# Patient Record
Sex: Female | Born: 1985 | Race: Black or African American | Hispanic: No | Marital: Single | Smoking: Current every day smoker
Health system: Southern US, Community
[De-identification: ages and names within clinical notes are randomized; demographics above are authoritative.]

## PROBLEM LIST (undated history)

## (undated) ENCOUNTER — Inpatient Hospital Stay (HOSPITAL_COMMUNITY): Payer: Self-pay

## (undated) DIAGNOSIS — F32A Depression, unspecified: Secondary | ICD-10-CM

## (undated) DIAGNOSIS — F419 Anxiety disorder, unspecified: Secondary | ICD-10-CM

## (undated) DIAGNOSIS — A599 Trichomoniasis, unspecified: Secondary | ICD-10-CM

## (undated) DIAGNOSIS — F329 Major depressive disorder, single episode, unspecified: Secondary | ICD-10-CM

## (undated) DIAGNOSIS — D573 Sickle-cell trait: Secondary | ICD-10-CM

## (undated) DIAGNOSIS — Z87442 Personal history of urinary calculi: Secondary | ICD-10-CM

## (undated) HISTORY — PX: OTHER SURGICAL HISTORY: SHX169

---
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
## 2006-02-03 ENCOUNTER — Inpatient Hospital Stay (HOSPITAL_COMMUNITY): Admission: AD | Admit: 2006-02-03 | Discharge: 2006-02-06 | Payer: Self-pay | Admitting: Gynecology

## 2006-02-03 ENCOUNTER — Ambulatory Visit: Payer: Self-pay | Admitting: Gynecology

## 2007-04-17 ENCOUNTER — Emergency Department (HOSPITAL_COMMUNITY): Admission: EM | Admit: 2007-04-17 | Discharge: 2007-04-17 | Payer: Self-pay | Admitting: Family Medicine

## 2007-04-25 ENCOUNTER — Emergency Department (HOSPITAL_COMMUNITY): Admission: EM | Admit: 2007-04-25 | Discharge: 2007-04-25 | Payer: Self-pay | Admitting: Emergency Medicine

## 2007-10-24 ENCOUNTER — Inpatient Hospital Stay (HOSPITAL_COMMUNITY): Admission: AD | Admit: 2007-10-24 | Discharge: 2007-10-24 | Payer: Self-pay | Admitting: Obstetrics

## 2007-10-27 ENCOUNTER — Inpatient Hospital Stay (HOSPITAL_COMMUNITY): Admission: AD | Admit: 2007-10-27 | Discharge: 2007-10-27 | Payer: Self-pay | Admitting: Obstetrics

## 2007-11-02 ENCOUNTER — Inpatient Hospital Stay (HOSPITAL_COMMUNITY): Admission: AD | Admit: 2007-11-02 | Discharge: 2007-11-02 | Payer: Self-pay | Admitting: Obstetrics

## 2007-11-04 ENCOUNTER — Inpatient Hospital Stay (HOSPITAL_COMMUNITY): Admission: AD | Admit: 2007-11-04 | Discharge: 2007-11-06 | Payer: Self-pay | Admitting: Obstetrics

## 2007-11-05 ENCOUNTER — Encounter: Payer: Self-pay | Admitting: Obstetrics

## 2009-01-13 ENCOUNTER — Emergency Department (HOSPITAL_COMMUNITY): Admission: EM | Admit: 2009-01-13 | Discharge: 2009-01-13 | Payer: Self-pay | Admitting: Emergency Medicine

## 2009-01-14 ENCOUNTER — Observation Stay (HOSPITAL_COMMUNITY): Admission: EM | Admit: 2009-01-14 | Discharge: 2009-01-14 | Payer: Self-pay | Admitting: Emergency Medicine

## 2009-03-08 ENCOUNTER — Emergency Department (HOSPITAL_COMMUNITY): Admission: EM | Admit: 2009-03-08 | Discharge: 2009-03-08 | Payer: Self-pay | Admitting: Emergency Medicine

## 2009-03-17 ENCOUNTER — Emergency Department (HOSPITAL_COMMUNITY): Admission: EM | Admit: 2009-03-17 | Discharge: 2009-03-17 | Payer: Self-pay | Admitting: Emergency Medicine

## 2009-08-21 ENCOUNTER — Emergency Department (HOSPITAL_COMMUNITY): Admission: EM | Admit: 2009-08-21 | Discharge: 2009-08-21 | Payer: Self-pay | Admitting: Emergency Medicine

## 2009-11-01 ENCOUNTER — Emergency Department (HOSPITAL_COMMUNITY): Admission: EM | Admit: 2009-11-01 | Discharge: 2009-11-02 | Payer: Self-pay | Admitting: Emergency Medicine

## 2010-05-01 LAB — POCT I-STAT, CHEM 8
Calcium, Ion: 1.15 mmol/L (ref 1.12–1.32)
Chloride: 109 mEq/L (ref 96–112)
Creatinine, Ser: 1.1 mg/dL (ref 0.4–1.2)
Glucose, Bld: 95 mg/dL (ref 70–99)
Potassium: 3.8 mEq/L (ref 3.5–5.1)

## 2010-05-01 LAB — URINALYSIS, ROUTINE W REFLEX MICROSCOPIC
Bilirubin Urine: NEGATIVE
Hgb urine dipstick: NEGATIVE
Ketones, ur: NEGATIVE mg/dL
Specific Gravity, Urine: 1.021 (ref 1.005–1.030)
pH: 6 (ref 5.0–8.0)

## 2010-09-08 ENCOUNTER — Emergency Department (HOSPITAL_COMMUNITY)
Admission: EM | Admit: 2010-09-08 | Discharge: 2010-09-08 | Disposition: A | Discharge status: Home / Self Care | Payer: Self-pay | Attending: Emergency Medicine | Admitting: Emergency Medicine

## 2010-09-08 ENCOUNTER — Emergency Department (HOSPITAL_COMMUNITY): Payer: Self-pay

## 2010-09-08 DIAGNOSIS — N760 Acute vaginitis: Secondary | ICD-10-CM | POA: Insufficient documentation

## 2010-09-08 DIAGNOSIS — M545 Low back pain, unspecified: Secondary | ICD-10-CM | POA: Insufficient documentation

## 2010-09-08 DIAGNOSIS — A499 Bacterial infection, unspecified: Secondary | ICD-10-CM | POA: Insufficient documentation

## 2010-09-08 DIAGNOSIS — E278 Other specified disorders of adrenal gland: Secondary | ICD-10-CM | POA: Insufficient documentation

## 2010-09-08 DIAGNOSIS — R109 Unspecified abdominal pain: Secondary | ICD-10-CM | POA: Insufficient documentation

## 2010-09-08 DIAGNOSIS — N3090 Cystitis, unspecified without hematuria: Secondary | ICD-10-CM | POA: Insufficient documentation

## 2010-09-08 DIAGNOSIS — D573 Sickle-cell trait: Secondary | ICD-10-CM | POA: Insufficient documentation

## 2010-09-08 DIAGNOSIS — B9689 Other specified bacterial agents as the cause of diseases classified elsewhere: Secondary | ICD-10-CM | POA: Insufficient documentation

## 2010-09-08 LAB — URINALYSIS, ROUTINE W REFLEX MICROSCOPIC
Glucose, UA: NEGATIVE mg/dL
Hgb urine dipstick: NEGATIVE
Protein, ur: NEGATIVE mg/dL
Specific Gravity, Urine: 1.016 (ref 1.005–1.030)
pH: 5.5 (ref 5.0–8.0)

## 2010-09-08 LAB — URINE MICROSCOPIC-ADD ON

## 2010-09-08 LAB — WET PREP, GENITAL: Yeast Wet Prep HPF POC: NONE SEEN

## 2010-09-09 LAB — GC/CHLAMYDIA PROBE AMP, GENITAL: GC Probe Amp, Genital: NEGATIVE

## 2010-11-04 LAB — URINALYSIS, ROUTINE W REFLEX MICROSCOPIC
Hgb urine dipstick: NEGATIVE
Specific Gravity, Urine: 1.018
Urobilinogen, UA: 1
pH: 7.5

## 2010-11-04 LAB — CBC
HCT: 34.6 — ABNORMAL LOW
Hemoglobin: 11.9 — ABNORMAL LOW
MCHC: 34.4
RDW: 13.6

## 2010-11-04 LAB — URINE MICROSCOPIC-ADD ON

## 2010-11-04 LAB — I-STAT 8, (EC8 V) (CONVERTED LAB)
BUN: <3 — ABNORMAL LOW
Bicarbonate: 19.5 — ABNORMAL LOW
Glucose, Bld: 87
pCO2, Ven: 26.9 — ABNORMAL LOW

## 2010-11-04 LAB — DIFFERENTIAL
Basophils Absolute: 0
Basophils Relative: 0
Eosinophils Relative: 3
Monocytes Absolute: 0.2
Neutro Abs: 4.8

## 2010-11-04 LAB — POCT PREGNANCY, URINE: Preg Test, Ur: POSITIVE

## 2010-11-11 LAB — CBC
HCT: 31.6 — ABNORMAL LOW
HCT: 32.8 — ABNORMAL LOW
MCV: 91.1
MCV: 91.3
Platelets: 227
RBC: 3.6 — ABNORMAL LOW
WBC: 10.2
WBC: 8.1

## 2010-11-11 LAB — CULTURE, BLOOD (ROUTINE X 2)

## 2010-11-11 LAB — CARDIOLIPIN ANTIBODIES, IGG, IGM, IGA
Anticardiolipin IgA: <7 — ABNORMAL LOW (ref <13)
Anticardiolipin IgM: <7 — ABNORMAL LOW (ref <10)

## 2010-11-11 LAB — COMPREHENSIVE METABOLIC PANEL
ALT: 11
AST: 21
Calcium: 8.8
GFR calc Af Amer: >60
Sodium: 138
Total Protein: 5.4 — ABNORMAL LOW

## 2010-11-11 LAB — LUPUS ANTICOAGULANT PANEL
DRVVT: 42.8 (ref 36.1–47.0)
PTT Lupus Anticoagulant: 39.4 (ref 36.3–48.8)

## 2010-11-11 LAB — URINE DRUGS OF ABUSE SCREEN W ALC, ROUTINE (REF LAB)
Barbiturate Quant, Ur: NEGATIVE
Creatinine,U: 54.9
Methadone: NEGATIVE
Phencyclidine (PCP): NEGATIVE

## 2010-11-11 LAB — T4, FREE: Free T4: 1.02

## 2011-08-27 ENCOUNTER — Other Ambulatory Visit (HOSPITAL_COMMUNITY): Payer: Self-pay | Admitting: Obstetrics

## 2011-08-27 DIAGNOSIS — R1011 Right upper quadrant pain: Secondary | ICD-10-CM

## 2011-08-29 ENCOUNTER — Ambulatory Visit (HOSPITAL_COMMUNITY): Payer: Medicaid Other

## 2011-09-01 ENCOUNTER — Encounter (HOSPITAL_COMMUNITY): Payer: Self-pay | Admitting: *Deleted

## 2011-09-01 ENCOUNTER — Inpatient Hospital Stay (HOSPITAL_COMMUNITY)
Admission: AD | Admit: 2011-09-01 | Discharge: 2011-09-01 | Disposition: A | Discharge status: Home / Self Care | Payer: Medicaid Other | Source: Ambulatory Visit | Attending: Obstetrics | Admitting: Obstetrics

## 2011-09-01 DIAGNOSIS — N898 Other specified noninflammatory disorders of vagina: Secondary | ICD-10-CM

## 2011-09-01 DIAGNOSIS — N938 Other specified abnormal uterine and vaginal bleeding: Secondary | ICD-10-CM | POA: Insufficient documentation

## 2011-09-01 DIAGNOSIS — N949 Unspecified condition associated with female genital organs and menstrual cycle: Secondary | ICD-10-CM | POA: Insufficient documentation

## 2011-09-01 HISTORY — DX: Sickle-cell trait: D57.3

## 2011-09-01 HISTORY — DX: Trichomoniasis, unspecified: A59.9

## 2011-09-01 HISTORY — DX: Major depressive disorder, single episode, unspecified: F32.9

## 2011-09-01 HISTORY — DX: Depression, unspecified: F32.A

## 2011-09-01 LAB — WET PREP, GENITAL
Clue Cells Wet Prep HPF POC: NONE SEEN
Trich, Wet Prep: NONE SEEN

## 2011-09-01 LAB — POCT PREGNANCY, URINE: Preg Test, Ur: NEGATIVE

## 2011-09-01 NOTE — MAU Note (Signed)
Had std screen, pap smear.  Was called with diagnosis of trichomonas- took 4 tablets for 1 time treatment on Thursday.  Friday started spotting, got a little heavier, but is brown in color- no redness noted.  Has implanon- 'expired' today.

## 2011-09-01 NOTE — MAU Provider Note (Signed)
History     CSN: [id number]  Arrival date and time: 09/01/11 0102   First Provider Initiated Contact with Patient 09/01/11 0932      No chief complaint on file.  HPI Pt is not pregnant and complains of vaginal discharge .  She was seen in Dr. Elsie Lucas office on Monday and was notified on Friday that she had trichomonas and was treated with Flagyl.  Then started having brown vaginal discharge, like apple butter.  She was also checked for GC./ Chlamdyia which was negative in Dr. Elsie Lucas office. She has minimal lower abdominal pain, not like regular period type cramp. Her pain comes and goes, like a Geneticist, molecular.She took Dayquil this morning with sore throat and bad cough.  Pt's has had Implanon which expires today.  She has continued to have unscheduled bleeding with her Implanon.  Her last episode of bleeding was June 10.  Pt denies pain with urination, constipation or diarrhea. Past Medical History  Diagnosis Date  . Sickle cell trait   . Trichomonas   . Depression     after loss of son    Past Surgical History  Procedure Date  . Extraction wisdom     Family History  Problem Relation Age of Onset  . Other Neg Hx     History  Substance Use Topics  . Smoking status: Current Everyday Smoker -- 0.5 packs/day for 10 years    Types: Cigarettes  . Smokeless tobacco: Never Used  . Alcohol Use: No    Allergies:  Allergies  Allergen Reactions  . Amoxicillin Anaphylaxis    Prescriptions prior to admission  Medication Sig Dispense Refill  . etonogestrel (IMPLANON) 68 MG IMPL implant Inject 1 each into the skin once.      . Pseudoeph-Doxylamine-DM-APAP (NYQUIL MULTI-SYMPTOM PO) Take 1 tablet by mouth at bedtime as needed. For cold symptoms      . Pseudoephedrine-APAP-DM (DAYQUIL MULTI-SYMPTOM PO) Take 1 tablet by mouth daily as needed. For cold symptoms      . tinidazole (TINDAMAX) 500 MG tablet Take 2 g by mouth once.        Review of Systems    Constitutional: Negative for fever and chills.  Gastrointestinal: Negative for nausea, vomiting, abdominal pain, diarrhea and constipation.  Genitourinary: Negative for dysuria, urgency, frequency and hematuria.   Physical Exam   Blood pressure 127/79, pulse 82, temperature 98.3 F (36.8 C), temperature source Oral, resp. rate 18, height 5\' 5"  (1.651 m), weight 167 lb (75.751 kg), last menstrual period 07/21/2011.  Physical Exam  Nursing note and vitals reviewed. Constitutional: She is oriented to person, place, and time. She appears well-developed and well-nourished.  HENT:  Head: Normocephalic.  Eyes: Pupils are equal, round, and reactive to light.  Neck: Normal range of motion. Neck supple.  Cardiovascular: Normal rate.   Respiratory: Effort normal.  GI: Soft. She exhibits no distension. There is no tenderness. There is no rebound and no guarding.  Genitourinary:       Small amount of brown discharge noted in vault; cervix clean; uterus and adnexal without palpable enlargement or tenderness; difficult to outline uterus and ovaries due to habitus.  Musculoskeletal: Normal range of motion.  Neurological: She is alert and oriented to person, place, and time.  Skin: Skin is warm and dry.  Psychiatric: She has a normal mood and affect.    MAU Course  Procedures Results for orders placed during the hospital encounter of 09/01/11 (from the past 72 hour(s))  POCT  PREGNANCY, URINE     Status: Normal   Collection Time   09/01/11  9:24 AM      Component Value Range Comment   Preg Test, Ur NEGATIVE  NEGATIVE   WET PREP, GENITAL     Status: Abnormal   Collection Time   09/01/11  9:42 AM      Component Value Range Comment   Yeast Wet Prep HPF POC NONE SEEN  NONE SEEN    Trich, Wet Prep NONE SEEN  NONE SEEN    Clue Cells Wet Prep HPF POC NONE SEEN  NONE SEEN    WBC, Wet Prep HPF POC FEW (*) NONE SEEN MANY BACTERIA SEEN    Assessment and Plan  Implanon break thru bleeding F/u with  Dr. Gaynell Lucas for replacement Implanon  Brooke Lucas 09/01/2011, 9:33 AM

## 2011-09-01 NOTE — MAU Note (Signed)
Scant old blood noted with exam

## 2011-09-05 ENCOUNTER — Ambulatory Visit (HOSPITAL_COMMUNITY): Payer: Self-pay

## 2011-11-10 ENCOUNTER — Emergency Department (HOSPITAL_COMMUNITY)
Admission: EM | Admit: 2011-11-10 | Discharge: 2011-11-10 | Disposition: A | Discharge status: Home / Self Care | Payer: Medicaid Other | Attending: Emergency Medicine | Admitting: Emergency Medicine

## 2011-11-10 ENCOUNTER — Encounter (HOSPITAL_COMMUNITY): Payer: Self-pay | Admitting: *Deleted

## 2011-11-10 DIAGNOSIS — D573 Sickle-cell trait: Secondary | ICD-10-CM | POA: Insufficient documentation

## 2011-11-10 DIAGNOSIS — L0231 Cutaneous abscess of buttock: Secondary | ICD-10-CM | POA: Insufficient documentation

## 2011-11-10 DIAGNOSIS — F17200 Nicotine dependence, unspecified, uncomplicated: Secondary | ICD-10-CM | POA: Insufficient documentation

## 2011-11-10 MED ORDER — HYDROCODONE-ACETAMINOPHEN 5-325 MG PO TABS: 1.0000 | ORAL_TABLET | ORAL | Status: DC | PRN

## 2011-11-10 NOTE — ED Notes (Signed)
Pt discharged.Vital signs stable and GCS 15 

## 2011-11-10 NOTE — ED Notes (Signed)
Pt presented to ED with abscess in the perianal area and very tender.

## 2011-11-10 NOTE — ED Provider Notes (Signed)
History     CSN: [id number]  Arrival date & time 11/10/11  2148   First MD Initiated Contact with Patient 11/10/11 2219      Chief Complaint  Patient presents with  . Abscess    (Consider location/radiation/quality/duration/timing/severity/associated sxs/prior treatment) HPI History provided by pt.   Pt presents w/ severely painful abscess of right medial buttock x 5 days.  Pain aggravated by walking and sitting.  Has attempted to squeeze but has not noticed any drainage.  No associated fever.  Has not had a BM since onset of sx.  No prior h/o abscess.  Also c/o painful cyst right groin that developed 3 days ago.  No associated GU sx.   Past Medical History  Diagnosis Date  . Sickle cell trait   . Trichomonas   . Depression     after loss of son    Past Surgical History  Procedure Date  . Extraction wisdom     Family History  Problem Relation Age of Onset  . Other Neg Hx     History  Substance Use Topics  . Smoking status: Current Every Day Smoker -- 0.5 packs/day for 10 years    Types: Cigarettes  . Smokeless tobacco: Never Used  . Alcohol Use: No    OB History    Grav Para Term Preterm Abortions TAB SAB Ect Mult Living   2 2 2  0 0 0 0 0 0 1      Review of Systems  All other systems reviewed and are negative.    Allergies  Amoxicillin and Penicillins  Home Medications   Current Outpatient Rx  Name Route Sig Dispense Refill  . ETONOGESTREL 68 MG [place] IMPL Subcutaneous Inject 1 each into the skin once.      BP 105/74  Pulse 90  Temp 98.8 F (37.1 C) (Oral)  Resp 18  SpO2 96%  Physical Exam  Nursing note and vitals reviewed. Constitutional: She is oriented to person, place, and time. She appears well-developed and well-nourished. No distress.  HENT:  Head: Normocephalic and atraumatic.  Eyes:       Normal appearance  Neck: Normal range of motion.  Pulmonary/Chest: Effort normal.  Genitourinary:       No tenderness of fistula of anus.     Pea-sized tender lymph node right groin.  Symmetrical to left side.   Musculoskeletal: Normal range of motion.  Neurological: She is alert and oriented to person, place, and time.  Skin:       3cm fluctuant abscess right medial buttock.  Severely tender.  No surrounding cellulitis.   Psychiatric: She has a normal mood and affect. Her behavior is normal.    ED Course  Procedures (including critical care time)  INCISION AND DRAINAGE Performed by: Otilio Miu Consent: Verbal consent obtained. Risks and benefits: risks, benefits and alternatives were discussed Type: abscess  Body area: right buttock  Anesthesia: local infiltration  Local anesthetic: lidocaine 2% w/ epinephrine  Anesthetic total: 5 ml  Complexity: complex Blunt dissection to break up loculations  Drainage: purulent  Drainage amount: moderate  Packing material: 1/4 in iodoform gauze  Patient tolerance: Patient tolerated the procedure well with no immediate complications.    Labs Reviewed - No data to display No results found.   1. Abscess of buttock       MDM  Pt presents w/ abscess of right medial buttock.  No surrounding cellulitis.  No apparent rectal involvement.  I&D'd and pt d/c'd home w/  vicodin for pain.  She will f/u with her PCP in 2 days for recheck and packing removal.  Also c/o painful lesion right groin.  Based on exam, I suspect an inflamed lymph node related to abscess.  Pt instructed to f/u w/ her PCP if it worsens or has not resolved by the time the abscess is healed.  Return precautions discussed.         Otilio Miu, [place] 11/10/11 [phone]

## 2011-11-10 NOTE — ED Notes (Signed)
Thurs pt noticed an irritation in her pelvic area.  Since then she has noticed a "hard lump" getting bigger and today she noticed a swollen lymph node to her R inguinal area.

## 2011-12-04 NOTE — ED Provider Notes (Signed)
Medical screening examination/treatment/procedure(s) were performed by non-physician practitioner and as supervising physician I was immediately available for consultation/collaboration.   Carleene Cooper III, MD 12/04/11 [phone]

## 2011-12-09 ENCOUNTER — Encounter (HOSPITAL_COMMUNITY): Payer: Self-pay

## 2011-12-09 ENCOUNTER — Emergency Department (HOSPITAL_COMMUNITY)
Admission: EM | Admit: 2011-12-09 | Discharge: 2011-12-10 | Discharge status: Against Medical Advice | Payer: Medicaid Other | Attending: Emergency Medicine | Admitting: Emergency Medicine

## 2011-12-09 ENCOUNTER — Emergency Department (HOSPITAL_COMMUNITY): Payer: Medicaid Other

## 2011-12-09 DIAGNOSIS — R1013 Epigastric pain: Secondary | ICD-10-CM | POA: Insufficient documentation

## 2011-12-09 DIAGNOSIS — R109 Unspecified abdominal pain: Secondary | ICD-10-CM

## 2011-12-09 DIAGNOSIS — F17200 Nicotine dependence, unspecified, uncomplicated: Secondary | ICD-10-CM | POA: Insufficient documentation

## 2011-12-09 DIAGNOSIS — D573 Sickle-cell trait: Secondary | ICD-10-CM | POA: Insufficient documentation

## 2011-12-09 DIAGNOSIS — R112 Nausea with vomiting, unspecified: Secondary | ICD-10-CM | POA: Insufficient documentation

## 2011-12-09 DIAGNOSIS — Z8659 Personal history of other mental and behavioral disorders: Secondary | ICD-10-CM | POA: Insufficient documentation

## 2011-12-09 DIAGNOSIS — Z8619 Personal history of other infectious and parasitic diseases: Secondary | ICD-10-CM | POA: Insufficient documentation

## 2011-12-09 LAB — CBC WITH DIFFERENTIAL/PLATELET
Basophils Absolute: 0 10*3/uL (ref 0.0–0.1)
Basophils Relative: 0 % (ref 0–1)
Hemoglobin: 13.8 g/dL (ref 12.0–15.0)
MCHC: 35.2 g/dL (ref 30.0–36.0)
Neutro Abs: 2.7 10*3/uL (ref 1.7–7.7)
Neutrophils Relative %: 44 % (ref 43–77)
Platelets: 227 10*3/uL (ref 150–400)
RDW: 12.5 % (ref 11.5–15.5)

## 2011-12-09 LAB — COMPREHENSIVE METABOLIC PANEL
AST: 13 U/L (ref 0–37)
Albumin: 4.5 g/dL (ref 3.5–5.2)
Alkaline Phosphatase: 56 U/L (ref 39–117)
Chloride: 102 mEq/L (ref 96–112)
Potassium: 3.4 mEq/L — ABNORMAL LOW (ref 3.5–5.1)
Sodium: 138 mEq/L (ref 135–145)
Total Bilirubin: 0.4 mg/dL (ref 0.3–1.2)
Total Protein: 7.5 g/dL (ref 6.0–8.3)

## 2011-12-09 LAB — LIPASE, BLOOD: Lipase: 34 U/L (ref 11–59)

## 2011-12-09 LAB — URINE MICROSCOPIC-ADD ON

## 2011-12-09 LAB — URINALYSIS, ROUTINE W REFLEX MICROSCOPIC
Glucose, UA: NEGATIVE mg/dL
Hgb urine dipstick: NEGATIVE
Ketones, ur: NEGATIVE mg/dL
Protein, ur: NEGATIVE mg/dL
pH: 6.5 (ref 5.0–8.0)

## 2011-12-09 LAB — POCT PREGNANCY, URINE: Preg Test, Ur: NEGATIVE

## 2011-12-09 NOTE — ED Notes (Addendum)
Pt reports lower abd cramping, "feeling sick," unable to eat or drink x2-3 days. Pt reports her BC implant expired 09/01/2011, pt reports her OBGYN informed her they would not change the implant until she has a menstrual cycle. Pt reports last menstrual cycle was 07/2011. Pt reports OTC pregnancy test have been negative  Pt denies abnormal vaginal bleeding or odor, reports increase in vaginal d/c

## 2011-12-09 NOTE — ED Provider Notes (Signed)
History     CSN: [id number]  Arrival date & time 12/09/11  2033   First MD Initiated Contact with Patient 12/09/11 2336      Chief Complaint  Patient presents with  . Abdominal Pain    (Consider location/radiation/quality/duration/timing/severity/associated sxs/prior treatment) HPIJovona W Lucas is a 26 y.o. female presenting with 3 days history of epigastric abdominal pain. She is a G2 P1 with a stillborn child. She says the pain is 6-10 out of 10 it is contraction type coming and going pain, she says she has no history of constipation, this is worse after she eats. Just complains of some vaginal discharge this morning usual however she has not had any vaginitis or pain. She is sexually active with one female partner using no condoms. She does have a history of using the Implanon however did not have it replaced when it was scheduled to in July. She denies any dysuria, frequency, denies any diarrhea. She says she's had nausea and vomiting associated with eating for the last 3 days as well. She does smoke, she has sickle cell trait and she denies any alcohol or any illicit drug use.   Past Medical History  Diagnosis Date  . Sickle cell trait   . Trichomonas   . Depression     after loss of son    Past Surgical History  Procedure Date  . Extraction wisdom     Family History  Problem Relation Age of Onset  . Other Neg Hx     History  Substance Use Topics  . Smoking status: Current Every Day Smoker -- 0.5 packs/day for 10 years    Types: Cigarettes  . Smokeless tobacco: Never Used  . Alcohol Use: No    OB History    Grav Para Term Preterm Abortions TAB SAB Ect Mult Living   2 2 2  0 0 0 0 0 0 1      Review of Systems At least 10pt or greater review of systems completed and are negative except where specified in the HPI.  Allergies  Amoxicillin and Penicillins  Home Medications   Current Outpatient Rx  Name Route Sig Dispense Refill  . ETONOGESTREL 68 MG [place] IMPL  Subcutaneous Inject 1 each into the skin once.      BP 137/64  Pulse 67  Temp 98.3 F (36.8 C) (Oral)  Resp 16  SpO2 99%  Physical Exam  Nursing notes reviewed.  Electronic medical record reviewed. VITAL SIGNS:   Filed Vitals:   12/09/11 2101  BP: 137/64  Pulse: 67  Temp: 98.3 F (36.8 C)  TempSrc: Oral  Resp: 16  SpO2: 99%   CONSTITUTIONAL: Awake, oriented, appears non-toxic HENT: Atraumatic, normocephalic, oral mucosa pink and moist, airway patent. Nares patent without drainage. External ears normal. EYES: Conjunctiva clear, EOMI, PERRLA NECK: Trachea midline, non-tender, supple CARDIOVASCULAR: Normal heart rate, Normal rhythm, No murmurs, rubs, gallops PULMONARY/CHEST: Clear to auscultation, no rhonchi, wheezes, or rales. Symmetrical breath sounds. Non-tender. ABDOMINAL: Non-distended, soft, nontender to palpation in the epigastrium, no rebound or guarding.  BS normal. NEUROLOGIC: Non-focal, moving all four extremities, no gross sensory or motor deficits. EXTREMITIES: No clubbing, cyanosis, or edema SKIN: Warm, Dry, No erythema, No rash  ED Course  Procedures (including critical care time)  Labs Reviewed  CBC WITH DIFFERENTIAL - Abnormal; Notable for the following:    Lymphocytes Relative 48 (*)     All other components within normal limits  COMPREHENSIVE METABOLIC PANEL - Abnormal; Notable for the  following:    Potassium 3.4 (*)     GFR calc non Af Amer 87 (*)     All other components within normal limits  URINALYSIS, ROUTINE W REFLEX MICROSCOPIC - Abnormal; Notable for the following:    APPearance CLOUDY (*)     Leukocytes, UA SMALL (*)     All other components within normal limits  URINE MICROSCOPIC-ADD ON - Abnormal; Notable for the following:    Squamous Epithelial / LPF MANY (*)     Bacteria, UA MANY (*)     All other components within normal limits  POCT PREGNANCY, URINE  URINE CULTURE   No results found.   No diagnosis found.    MDM  Brooke Lucas is a 26 y.o. female presents with 3 days history of nausea vomiting and epigastric pain- her clinical presentation is consistent with constipation.  Labs were triage showing no abnormalities on a comprehensive metabolic panel-urine microscopic was unremarkable and her pregnancy test was negative.  Ordered a pelvic exam, and an x-ray of her abdomen - however the patient left AGAINST MEDICAL ADVICE prior to these tests being performed.  I was not able to see the patient or to intervene she simply left the room.          Jones Skene, MD 12/10/11 0005

## 2011-12-10 ENCOUNTER — Emergency Department (HOSPITAL_COMMUNITY): Payer: Medicaid Other

## 2011-12-11 LAB — URINE CULTURE

## 2011-12-24 ENCOUNTER — Emergency Department (HOSPITAL_COMMUNITY)
Admission: EM | Admit: 2011-12-24 | Discharge: 2011-12-24 | Disposition: A | Discharge status: Home / Self Care | Payer: Medicaid Other | Attending: Emergency Medicine | Admitting: Emergency Medicine

## 2011-12-24 ENCOUNTER — Encounter (HOSPITAL_COMMUNITY): Payer: Self-pay | Admitting: *Deleted

## 2011-12-24 ENCOUNTER — Emergency Department (HOSPITAL_COMMUNITY): Payer: Medicaid Other

## 2011-12-24 DIAGNOSIS — F3289 Other specified depressive episodes: Secondary | ICD-10-CM | POA: Insufficient documentation

## 2011-12-24 DIAGNOSIS — Z8619 Personal history of other infectious and parasitic diseases: Secondary | ICD-10-CM | POA: Insufficient documentation

## 2011-12-24 DIAGNOSIS — B349 Viral infection, unspecified: Secondary | ICD-10-CM

## 2011-12-24 DIAGNOSIS — F329 Major depressive disorder, single episode, unspecified: Secondary | ICD-10-CM | POA: Insufficient documentation

## 2011-12-24 DIAGNOSIS — D573 Sickle-cell trait: Secondary | ICD-10-CM | POA: Insufficient documentation

## 2011-12-24 DIAGNOSIS — Z79899 Other long term (current) drug therapy: Secondary | ICD-10-CM | POA: Insufficient documentation

## 2011-12-24 DIAGNOSIS — B9789 Other viral agents as the cause of diseases classified elsewhere: Secondary | ICD-10-CM | POA: Insufficient documentation

## 2011-12-24 DIAGNOSIS — F17200 Nicotine dependence, unspecified, uncomplicated: Secondary | ICD-10-CM | POA: Insufficient documentation

## 2011-12-24 LAB — CBC WITH DIFFERENTIAL/PLATELET
Eosinophils Relative: 6 % — ABNORMAL HIGH (ref 0–5)
HCT: 39.7 % (ref 36.0–46.0)
Hemoglobin: 13.8 g/dL (ref 12.0–15.0)
Lymphocytes Relative: 47 % — ABNORMAL HIGH (ref 12–46)
MCV: 86.1 fL (ref 78.0–100.0)
Monocytes Absolute: 0.2 10*3/uL (ref 0.1–1.0)
Monocytes Relative: 5 % (ref 3–12)
Neutro Abs: 1.9 10*3/uL (ref 1.7–7.7)
RDW: 12.8 % (ref 11.5–15.5)
WBC: 4.5 10*3/uL (ref 4.0–10.5)

## 2011-12-24 LAB — BASIC METABOLIC PANEL
BUN: 10 mg/dL (ref 6–23)
CO2: 25 mEq/L (ref 19–32)
Calcium: 9.1 mg/dL (ref 8.4–10.5)
Chloride: 104 mEq/L (ref 96–112)
Creatinine, Ser: 0.85 mg/dL (ref 0.50–1.10)
Glucose, Bld: 108 mg/dL — ABNORMAL HIGH (ref 70–99)

## 2011-12-24 LAB — URINALYSIS, ROUTINE W REFLEX MICROSCOPIC
Bilirubin Urine: NEGATIVE
Glucose, UA: NEGATIVE mg/dL
Hgb urine dipstick: NEGATIVE
Ketones, ur: NEGATIVE mg/dL
Leukocytes, UA: NEGATIVE
Protein, ur: NEGATIVE mg/dL
pH: 6.5 (ref 5.0–8.0)

## 2011-12-24 MED ORDER — SODIUM CHLORIDE 0.9 % IV BOLUS (SEPSIS)
1000.0000 mL | Freq: Once | INTRAVENOUS | Status: AC
  Administered 2011-12-24: 1000 mL via INTRAVENOUS

## 2011-12-24 MED ORDER — MORPHINE SULFATE 4 MG/ML IJ SOLN
4.0000 mg | Freq: Once | INTRAMUSCULAR | Status: AC
  Administered 2011-12-24: 4 mg via INTRAVENOUS
  Filled 2011-12-24: qty 1

## 2011-12-24 MED ORDER — IBUPROFEN 800 MG PO TABS: 800.0000 mg | ORAL_TABLET | Freq: Three times a day (TID) | ORAL | Status: DC | PRN

## 2011-12-24 MED ORDER — KETOROLAC TROMETHAMINE 30 MG/ML IJ SOLN
30.0000 mg | Freq: Once | INTRAMUSCULAR | Status: AC
  Administered 2011-12-24: 30 mg via INTRAVENOUS
  Filled 2011-12-24: qty 1

## 2011-12-24 NOTE — ED Notes (Signed)
Pt bought in by [facility] for flu like symptoms. Pt received 4mg  of Zofran IM PTA

## 2011-12-24 NOTE — ED Provider Notes (Signed)
History     CSN: [id number]  Arrival date & time 12/24/11  0436   First MD Initiated Contact with Patient 12/24/11 0445      Chief Complaint  Patient presents with  . Influenza    (Consider location/radiation/quality/duration/timing/severity/associated sxs/prior treatment) HPI PT with history of sickle trait reports she has been feeling bad since yesterday. Complaining of intermittent severe aching headache, chills, diffuse myalgias, productive cough and vomiting. She reports subjective fever at home. Symptoms improved with Norco she took at home.   Past Medical History  Diagnosis Date  . Sickle cell trait   . Trichomonas   . Depression     after loss of son    Past Surgical History  Procedure Date  . Extraction wisdom     Family History  Problem Relation Age of Onset  . Other Neg Hx     History  Substance Use Topics  . Smoking status: Current Every Day Smoker -- 0.5 packs/day for 10 years    Types: Cigarettes  . Smokeless tobacco: Never Used  . Alcohol Use: No    OB History    Grav Para Term Preterm Abortions TAB SAB Ect Mult Living   2 2 2  0 0 0 0 0 0 1      Review of Systems All other systems reviewed and are negative except as noted in HPI.   Allergies  Amoxicillin and Penicillins  Home Medications   Current Outpatient Rx  Name  Route  Sig  Dispense  Refill  . ETONOGESTREL 68 MG [place] IMPL   Subcutaneous   Inject 1 each into the skin once.           Pulse 68  Temp 97.3 F (36.3 C) (Oral)  Resp 20  SpO2 100%  Physical Exam  Nursing note and vitals reviewed. Constitutional: She is oriented to person, place, and time. She appears well-developed and well-nourished.  HENT:  Head: Normocephalic and atraumatic.       Membranes dry  Eyes: EOM are normal. Pupils are equal, round, and reactive to light.  Neck: Normal range of motion. Neck supple.       No meningismus  Cardiovascular: Normal rate, normal heart sounds and intact distal pulses.    Pulmonary/Chest: Effort normal and breath sounds normal.  Abdominal: Bowel sounds are normal. She exhibits no distension. There is no tenderness.  Musculoskeletal: Normal range of motion. She exhibits no edema and no tenderness.  Lymphadenopathy:    She has no cervical adenopathy.  Neurological: She is alert and oriented to person, place, and time. She has normal strength. No cranial nerve deficit or sensory deficit.  Skin: Skin is warm and dry. No rash noted.  Psychiatric: She has a normal mood and affect.    ED Course  Procedures (including critical care time)  Labs Reviewed  CBC WITH DIFFERENTIAL - Abnormal; Notable for the following:    Lymphocytes Relative 47 (*)     Eosinophils Relative 6 (*)     All other components within normal limits  BASIC METABOLIC PANEL - Abnormal; Notable for the following:    Glucose, Bld 108 (*)     All other components within normal limits  URINALYSIS, ROUTINE W REFLEX MICROSCOPIC  PREGNANCY, URINE   No results found.   No diagnosis found.    MDM  Blood work neg. Pt feeling better. Awaiting UA. Care signed out to Dr. Roselyn Bering and Ebbie Ridge, PA to recheck.  Pius Byrom B. Bernette Mayers, MD 12/24/11 [phone]

## 2011-12-24 NOTE — ED Notes (Signed)
Pt told to call out when she feels like she can provide a urine.

## 2011-12-24 NOTE — ED Notes (Signed)
Pt states headache diminished, though continues to be 7/10.  Encouraged to give urine.  Is drinking sprite.

## 2012-03-27 ENCOUNTER — Emergency Department (HOSPITAL_COMMUNITY)
Admission: EM | Admit: 2012-03-27 | Discharge: 2012-03-27 | Disposition: A | Discharge status: Home / Self Care | Payer: Medicaid Other | Attending: Emergency Medicine | Admitting: Emergency Medicine

## 2012-03-27 ENCOUNTER — Encounter (HOSPITAL_COMMUNITY): Payer: Self-pay

## 2012-03-27 DIAGNOSIS — D573 Sickle-cell trait: Secondary | ICD-10-CM | POA: Insufficient documentation

## 2012-03-27 DIAGNOSIS — Z79899 Other long term (current) drug therapy: Secondary | ICD-10-CM | POA: Insufficient documentation

## 2012-03-27 DIAGNOSIS — Z8619 Personal history of other infectious and parasitic diseases: Secondary | ICD-10-CM | POA: Insufficient documentation

## 2012-03-27 DIAGNOSIS — Z8659 Personal history of other mental and behavioral disorders: Secondary | ICD-10-CM | POA: Insufficient documentation

## 2012-03-27 DIAGNOSIS — K612 Anorectal abscess: Secondary | ICD-10-CM | POA: Insufficient documentation

## 2012-03-27 DIAGNOSIS — K610 Anal abscess: Secondary | ICD-10-CM

## 2012-03-27 DIAGNOSIS — F17200 Nicotine dependence, unspecified, uncomplicated: Secondary | ICD-10-CM | POA: Insufficient documentation

## 2012-03-27 MED ORDER — HYDROCODONE-ACETAMINOPHEN 5-325 MG PO TABS: 1.0000 | ORAL_TABLET | Freq: Four times a day (QID) | ORAL | Status: DC | PRN

## 2012-03-27 NOTE — ED Provider Notes (Signed)
History     CSN: [id number]  Arrival date & time 03/27/12  0340   First MD Initiated Contact with Patient 03/27/12 0449      Chief Complaint  Patient presents with  . Abscess    (Consider location/radiation/quality/duration/timing/severity/associated sxs/prior treatment) Patient is a 27 y.o. female presenting with abscess. The history is provided by the patient.  Abscess Associated symptoms: no fever    patient with history of a perianal abscess in the past. This time on the opposite cheek on the left side patient has had dust discomfort and swelling in that area for about 3 weeks got significantly bigger and worse in the last 3 days. Upon arrival to the emergency department started draining spontaneously. Large amounts of purulent material patient feels much much better. Pain prior to that was 10 out of 10 now it's about a 1/10. Pain did radiate from the anal area to her left hip. No fever no nausea no vomiting no diarrhea. Patient is not immunocompromised.  Past Medical History  Diagnosis Date  . Sickle cell trait   . Trichomonas   . Depression     after loss of son    Past Surgical History  Procedure Laterality Date  . Extraction wisdom      Family History  Problem Relation Age of Onset  . Other Neg Hx     History  Substance Use Topics  . Smoking status: Current Every Day Smoker -- 0.50 packs/day for 10 years    Types: Cigarettes  . Smokeless tobacco: Never Used  . Alcohol Use: No    OB History   Grav Para Term Preterm Abortions TAB SAB Ect Mult Living   2 2 2  0 0 0 0 0 0 1      Review of Systems  Constitutional: Negative for fever.  HENT: Negative for congestion.   Eyes: Negative for visual disturbance.  Respiratory: Negative for shortness of breath.   Cardiovascular: Negative for chest pain.  Gastrointestinal: Negative for abdominal pain.  Genitourinary: Negative for dysuria.  Musculoskeletal: Negative for back pain.  Skin: Negative for rash.   Hematological: Does not bruise/bleed easily.    Allergies  Amoxicillin and Penicillins  Home Medications   Current Outpatient Rx  Name  Route  Sig  Dispense  Refill  . etonogestrel (IMPLANON) 68 MG IMPL implant   Subcutaneous   Inject 1 each into the skin once.         Marland Kitchen ibuprofen (ADVIL,MOTRIN) 800 MG tablet   Oral   Take 1 tablet (800 mg total) by mouth every 8 (eight) hours as needed for pain.   21 tablet   0   . HYDROcodone-acetaminophen (NORCO/VICODIN) 5-325 MG per tablet   Oral   Take 1-2 tablets by mouth every 6 (six) hours as needed for pain.   10 tablet   0     BP 97/66  Pulse 89  Temp(Src) 97.4 F (36.3 C) (Oral)  Resp 15  SpO2 98%  Physical Exam  Constitutional: She is oriented to person, place, and time. She appears well-developed and well-nourished.  HENT:  Head: Normocephalic and atraumatic.  Eyes: EOM are normal. Pupils are equal, round, and reactive to light.  Cardiovascular: Normal rate, regular rhythm and normal heart sounds.   No murmur heard. Pulmonary/Chest: Effort normal and breath sounds normal.  Abdominal: Soft. Bowel sounds are normal. There is no tenderness.  Genitourinary:  Perianal area left buttocks with a quarter size area of induration punctate in the center  draining purulent discharge. Sniffing only decompressed.  Musculoskeletal: Normal range of motion. She exhibits no edema.  Neurological: She is alert and oriented to person, place, and time. No cranial nerve deficit. She exhibits normal muscle tone. Coordination normal.  Skin: Skin is warm.    ED Course  Procedures (including critical care time)  Labs Reviewed - No data to display No results found.   1. Perianal abscess       MDM  Perianal abscess no evidence of perirectal involvement. On the left buttocks area spontaneously draining copious amounts of purulent discharge. Be required. Area of induration is about the size of a quarter. Patient will be discharged  home with pain medication and sitz baths.   Patient will return for any worsening of the abscess at this point in time she prefers not to have it I&D with a draining is much as it is has a good chance we'll do fine without an I&D.        Shelda Jakes, MD 03/27/12 [phone]

## 2012-03-27 NOTE — ED Notes (Addendum)
L perianal abscess ~ 1.5" from anus, ~ 0.5cm diameter,  White head noted, no drainage. Denies s/sx other than pain and swelling. Denies fever or drainage. Pt in gown. Suture cart & I&D tray set up at [facility]. Alert, NAD, calm, interactive.

## 2012-03-27 NOTE — ED Notes (Signed)
Pt reports "boil now draining pus".

## 2012-03-27 NOTE — ED Notes (Signed)
Patient presents with perianal/perineal abscess. Onset of swelling and pain 3 days ago but "bump has been there for almost 2 weeks." hx abscess & I&D in 10/2011. No fevers, sweats or chills. Has tried warm compress & pressure with no relief.

## 2012-08-12 ENCOUNTER — Ambulatory Visit: Payer: Self-pay | Admitting: Obstetrics

## 2012-08-25 ENCOUNTER — Ambulatory Visit: Payer: Self-pay | Admitting: Obstetrics

## 2012-09-23 ENCOUNTER — Ambulatory Visit: Payer: Self-pay | Admitting: Obstetrics

## 2012-10-30 ENCOUNTER — Encounter (HOSPITAL_COMMUNITY): Payer: Self-pay

## 2012-10-30 ENCOUNTER — Emergency Department (HOSPITAL_COMMUNITY)
Admission: EM | Admit: 2012-10-30 | Discharge: 2012-10-30 | Disposition: A | Discharge status: Home / Self Care | Payer: Medicaid Other | Attending: Emergency Medicine | Admitting: Emergency Medicine

## 2012-10-30 DIAGNOSIS — R51 Headache: Secondary | ICD-10-CM | POA: Insufficient documentation

## 2012-10-30 DIAGNOSIS — Z862 Personal history of diseases of the blood and blood-forming organs and certain disorders involving the immune mechanism: Secondary | ICD-10-CM | POA: Insufficient documentation

## 2012-10-30 DIAGNOSIS — Z8659 Personal history of other mental and behavioral disorders: Secondary | ICD-10-CM | POA: Insufficient documentation

## 2012-10-30 DIAGNOSIS — Z8619 Personal history of other infectious and parasitic diseases: Secondary | ICD-10-CM | POA: Insufficient documentation

## 2012-10-30 DIAGNOSIS — F17200 Nicotine dependence, unspecified, uncomplicated: Secondary | ICD-10-CM | POA: Insufficient documentation

## 2012-10-30 DIAGNOSIS — Z880 Allergy status to penicillin: Secondary | ICD-10-CM | POA: Insufficient documentation

## 2012-10-30 DIAGNOSIS — R110 Nausea: Secondary | ICD-10-CM | POA: Insufficient documentation

## 2012-10-30 MED ORDER — KETOROLAC TROMETHAMINE 30 MG/ML IJ SOLN
30.0000 mg | Freq: Once | INTRAMUSCULAR | Status: AC
  Administered 2012-10-30: 30 mg via INTRAVENOUS
  Filled 2012-10-30: qty 1

## 2012-10-30 MED ORDER — DIPHENHYDRAMINE HCL 50 MG/ML IJ SOLN
25.0000 mg | Freq: Once | INTRAMUSCULAR | Status: AC
  Administered 2012-10-30: 25 mg via INTRAVENOUS
  Filled 2012-10-30 (×2): qty 1

## 2012-10-30 MED ORDER — METOCLOPRAMIDE HCL 5 MG/ML IJ SOLN
10.0000 mg | Freq: Once | INTRAMUSCULAR | Status: AC
  Administered 2012-10-30: 10 mg via INTRAVENOUS
  Filled 2012-10-30: qty 2

## 2012-10-30 MED ORDER — SODIUM CHLORIDE 0.9 % IV BOLUS (SEPSIS)
1000.0000 mL | Freq: Once | INTRAVENOUS | Status: AC
  Administered 2012-10-30: 1000 mL via INTRAVENOUS

## 2012-10-30 NOTE — ED Notes (Signed)
Pt c/o headache to the left side of her head for the past 3 days/has not taken anything OTC for the headache-pt has hx sickle cell-states she has a crisis once a year and states it presents as a headache-nausea and dry heaved x 1  Per EMS/#20 left [facility]-Zofran 4 mg IV-MP SR/12 lead unremarkable

## 2012-10-30 NOTE — ED Provider Notes (Signed)
CSN: [id number]     Arrival date & time 10/30/12  4098 History   First MD Initiated Contact with Patient 10/30/12 0340     Chief Complaint  Patient presents with  . Headache   (Consider location/radiation/quality/duration/timing/severity/associated sxs/prior Treatment) HPI Comments: Patient is a 27 year old female with a past medical history of migraines who presents with a headache that woke her from sleep tonight. Patient reports a gradual onset and progressive worsening of the headache. The pain is sharp, constant and is located in the left head without radiation. Patient has tried nothing for symptoms without relief. No alleviating/aggravating factors. Patient reports associated nausea. Patient denies fever, vomiting, diarrhea, numbness/tingling, weakness, visual changes, congestion, chest pain, SOB, abdominal pain.     Patient is a 27 y.o. female presenting with headaches.  Headache Associated symptoms: nausea     Past Medical History  Diagnosis Date  . Sickle cell trait   . Trichomonas   . Depression     after loss of son   Past Surgical History  Procedure Laterality Date  . Extraction wisdom     Family History  Problem Relation Age of Onset  . Other Neg Hx    History  Substance Use Topics  . Smoking status: Current Every Day Smoker -- 0.50 packs/day for 10 years    Types: Cigarettes  . Smokeless tobacco: Never Used  . Alcohol Use: No   OB History   Grav Para Term Preterm Abortions TAB SAB Ect Mult Living   2 2 2  0 0 0 0 0 0 1     Review of Systems  Gastrointestinal: Positive for nausea.  Neurological: Positive for headaches.  All other systems reviewed and are negative.    Allergies  Amoxicillin and Penicillins  Home Medications   Current Outpatient Rx  Name  Route  Sig  Dispense  Refill  . etonogestrel (IMPLANON) 68 MG IMPL implant   Subcutaneous   Inject 1 each into the skin once.          There were no vitals taken for this visit. Physical  Exam  Nursing note and vitals reviewed. Constitutional: She is oriented to person, place, and time. She appears well-developed and well-nourished. No distress.  HENT:  Head: Normocephalic and atraumatic.  Eyes: Conjunctivae and EOM are normal.  Neck: Normal range of motion.  No meningeal signs.   Cardiovascular: Normal rate and regular rhythm.  Exam reveals no gallop and no friction rub.   No murmur heard. Pulmonary/Chest: Effort normal and breath sounds normal. She has no wheezes. She has no rales. She exhibits no tenderness.  Abdominal: Soft. She exhibits no distension. There is no tenderness. There is no rebound and no guarding.  Musculoskeletal: Normal range of motion.  Neurological: She is alert and oriented to person, place, and time. Coordination normal.  Speech is goal-oriented. Moves limbs without ataxia.   Skin: Skin is warm and dry.  Psychiatric: She has a normal mood and affect. Her behavior is normal.    ED Course  Procedures (including critical care time) Labs Review Labs Reviewed - No data to display Imaging Review No results found.  MDM   1. Headache     4:07 AM Patient given migraine cocktail and is feeling better. Patient is ready to go home. Vitals stable and patient afebrile. No meningeal signs. Patient instructed to return with worsening or concerning symptoms.     Emilia Beck, PA-C 10/30/12 0410

## 2012-10-30 NOTE — ED Provider Notes (Signed)
Medical screening examination/treatment/procedure(s) were performed by non-physician practitioner and as supervising physician I was immediately available for consultation/collaboration.  Elysha Daw M Kaja Jackowski, MD 10/30/12 0528 

## 2012-10-30 NOTE — ED Notes (Signed)
Bed: NW29 Expected date:  Expected time:  Means of arrival:  Comments: EMS/headache-sickle cell

## 2013-03-10 ENCOUNTER — Emergency Department (INDEPENDENT_AMBULATORY_CARE_PROVIDER_SITE_OTHER)
Admission: EM | Admit: 2013-03-10 | Discharge: 2013-03-10 | Disposition: A | Discharge status: Home / Self Care | Payer: Medicaid Other | Source: Home / Self Care | Attending: Emergency Medicine | Admitting: Emergency Medicine

## 2013-03-10 ENCOUNTER — Encounter (HOSPITAL_COMMUNITY): Payer: Self-pay | Admitting: Emergency Medicine

## 2013-03-10 DIAGNOSIS — N390 Urinary tract infection, site not specified: Secondary | ICD-10-CM

## 2013-03-10 LAB — POCT URINALYSIS DIP (DEVICE)
BILIRUBIN URINE: NEGATIVE
GLUCOSE, UA: NEGATIVE mg/dL
KETONES UR: NEGATIVE mg/dL
Nitrite: NEGATIVE
PH: 7.5 (ref 5.0–8.0)
Protein, ur: NEGATIVE mg/dL
Specific Gravity, Urine: 1.015 (ref 1.005–1.030)
Urobilinogen, UA: 1 mg/dL (ref 0.0–1.0)

## 2013-03-10 LAB — POCT PREGNANCY, URINE: Preg Test, Ur: NEGATIVE

## 2013-03-10 MED ORDER — HYDROCODONE-ACETAMINOPHEN 5-325 MG PO TABS
ORAL_TABLET | ORAL | Status: AC
  Filled 2013-03-10: qty 2

## 2013-03-10 MED ORDER — PHENAZOPYRIDINE HCL 200 MG PO TABS: 200.0000 mg | ORAL_TABLET | Freq: Three times a day (TID) | ORAL | Status: DC | PRN

## 2013-03-10 MED ORDER — CIPROFLOXACIN HCL 500 MG PO TABS: 500.0000 mg | ORAL_TABLET | Freq: Two times a day (BID) | ORAL | Status: DC

## 2013-03-10 MED ORDER — HYDROCODONE-ACETAMINOPHEN 5-325 MG PO TABS: ORAL_TABLET | ORAL | Status: DC

## 2013-03-10 MED ORDER — HYDROCODONE-ACETAMINOPHEN 5-325 MG PO TABS
2.0000 | ORAL_TABLET | Freq: Once | ORAL | Status: AC
  Administered 2013-03-10: 2 via ORAL

## 2013-03-10 NOTE — Discharge Instructions (Signed)
Urinary Tract Infection  Urinary tract infections (UTIs) can develop anywhere along your urinary tract. Your urinary tract is your body's drainage system for removing wastes and extra water. Your urinary tract includes two kidneys, two ureters, a bladder, and a urethra. Your kidneys are a pair of bean-shaped organs. Each kidney is about the size of your fist. They are located below your ribs, one on each side of your spine.  CAUSES  Infections are caused by microbes, which are microscopic organisms, including fungi, viruses, and bacteria. These organisms are so small that they can only be seen through a microscope. Bacteria are the microbes that most commonly cause UTIs.  SYMPTOMS   Symptoms of UTIs may vary by age and gender of the patient and by the location of the infection. Symptoms in young women typically include a frequent and intense urge to urinate and a painful, burning feeling in the bladder or urethra during urination. Older women and men are more likely to be tired, shaky, and weak and have muscle aches and abdominal pain. A fever may mean the infection is in your kidneys. Other symptoms of a kidney infection include pain in your back or sides below the ribs, nausea, and vomiting.  DIAGNOSIS  To diagnose a UTI, your caregiver will ask you about your symptoms. Your caregiver also will ask to provide a urine sample. The urine sample will be tested for bacteria and white blood cells. White blood cells are made by your body to help fight infection.  TREATMENT   Typically, UTIs can be treated with medication. Because most UTIs are caused by a bacterial infection, they usually can be treated with the use of antibiotics. The choice of antibiotic and length of treatment depend on your symptoms and the type of bacteria causing your infection.  HOME CARE INSTRUCTIONS   If you were prescribed antibiotics, take them exactly as your caregiver instructs you. Finish the medication even if you feel better after you  have only taken some of the medication.   Drink enough water and fluids to keep your urine clear or pale yellow.   Avoid caffeine, tea, and carbonated beverages. They tend to irritate your bladder.   Empty your bladder often. Avoid holding urine for long periods of time.   Empty your bladder before and after sexual intercourse.   After a bowel movement, women should cleanse from front to back. Use each tissue only once.  SEEK MEDICAL CARE IF:    You have back pain.   You develop a fever.   Your symptoms do not begin to resolve within 3 days.  SEEK IMMEDIATE MEDICAL CARE IF:    You have severe back pain or lower abdominal pain.   You develop chills.   You have nausea or vomiting.   You have continued burning or discomfort with urination.  MAKE SURE YOU:    Understand these instructions.   Will watch your condition.   Will get help right away if you are not doing well or get worse.  Document Released: 11/06/2004 Document Revised: 07/29/2011 Document Reviewed: 03/07/2011  Exit[employer] Patient Information 2014 [employer].

## 2013-03-10 NOTE — ED Notes (Signed)
Pt reports 3  days of an odor to her urine,  with frequency.  She denies burning or fever.    She has noticed some pinkish color on the toilet tissue after voiding and right flank pain.  She also c/o pelvic crampy pain.

## 2013-03-10 NOTE — ED Provider Notes (Signed)
Chief Complaint   Chief Complaint  Patient presents with  . Hematuria    History of Present Illness   Brooke Lucas is a 28 year old female who's had a 2 to three-day history of blood in her urine with some clots, dysuria, frequency, urgency, and malodorous urine. She's had pain in the lower abdomen lower back and felt chilled. She denies any fever, nausea, or vomiting. No GYN complaints. She had a urinary tract infection, but this was years ago.  Review of Systems   Other than as noted above, the patient denies any of the following symptoms: General:  No fevers, chills, or sweats. GI:  No abdominal pain, back pain, nausea, vomiting, diarrhea, or constipation. GU:  No dysuria, frequency, urgency, hematuria, or incontinence. GYN:  No discharge, itching, vulvar pain or lesions, pelvic pain, or abnormal vaginal bleeding.  PMFSH   Past medical history, family history, social history, meds, and allergies were reviewed.  She is allergic to penicillin and amoxicillin. She has sickle cell trait.  Physical Examination     Vital signs:  BP 131/87  Pulse 64  Temp(Src) 98.5 F (36.9 C) (Oral)  Resp 16  SpO2 100% Gen:  Alert, oriented, in no distress. Lungs:  Clear to auscultation, no wheezes, rales or rhonchi. Heart:  Regular rhythm, no gallop or murmer. Abdomen:  Flat and soft. There was slight suprapubic pain to palpation.  No guarding, or rebound.  No hepato-splenomegaly or mass.  Bowel sounds were normally active.  No hernia. Back:  No CVA tenderness.  Skin:  Clear, warm and dry.  Labs   Results for orders placed during the hospital encounter of 03/10/13  POCT URINALYSIS DIP (DEVICE)      Result Value Range   Glucose, UA NEGATIVE  NEGATIVE mg/dL   Bilirubin Urine NEGATIVE  NEGATIVE   Ketones, ur NEGATIVE  NEGATIVE mg/dL   Specific Gravity, Urine 1.015  1.005 - 1.030   Hgb urine dipstick MODERATE (*) NEGATIVE   pH 7.5  5.0 - 8.0   Protein, ur NEGATIVE  NEGATIVE mg/dL   Urobilinogen, UA 1.0  0.0 - 1.0 mg/dL   Nitrite NEGATIVE  NEGATIVE   Leukocytes, UA SMALL (*) NEGATIVE  POCT PREGNANCY, URINE      Result Value Range   Preg Test, Ur NEGATIVE  NEGATIVE     A urine culture was obtained.  Results are pending at this time and we will call about any positive results.  Assessment   The encounter diagnosis was UTI (lower urinary tract infection).   No evidence of pyelonephritis.    Plan   1.  Meds:  The following meds were prescribed:   Discharge Medication List as of 03/10/2013 12:29 PM    START taking these medications   Details  ciprofloxacin (CIPRO) 500 MG tablet Take 1 tablet (500 mg total) by mouth every 12 (twelve) hours., Starting 03/10/2013, Until Discontinued, Normal    HYDROcodone-acetaminophen (NORCO/VICODIN) 5-325 MG per tablet 1 to 2 tabs every 4 to 6 hours as needed for pain., Print    phenazopyridine (PYRIDIUM) 200 MG tablet Take 1 tablet (200 mg total) by mouth 3 (three) times daily as needed for pain., Starting 03/10/2013, Until Discontinued, Normal        2.  Patient Education/Counseling:  The patient was given appropriate handouts, self care instructions, and instructed in symptomatic relief. The patient was told to avoid intercourse for 10 days, get extra fluids, and return for a follow up with her primary care doctor  at the completion of treatment for a repeat UA and culture.    3.  Follow up:  The patient was told to follow up here if no better in 3 to 4 days, or sooner if becoming worse in any way, and given some red flag symptoms such as fever, persistent vomiting, or severe flank or abdominal pain which would prompt immediate return.     Reuben Likes, MD 03/10/13 [phone]

## 2013-03-11 LAB — URINE CULTURE: Colony Count: 5000

## 2013-03-12 NOTE — Progress Notes (Signed)
Quick Note:  Test result was normal. No further action is needed at this time. ______ 

## 2013-04-19 ENCOUNTER — Ambulatory Visit: Payer: Medicaid Other | Admitting: Obstetrics

## 2013-05-05 ENCOUNTER — Ambulatory Visit (INDEPENDENT_AMBULATORY_CARE_PROVIDER_SITE_OTHER): Payer: Medicaid Other | Admitting: Obstetrics

## 2013-05-05 ENCOUNTER — Encounter: Payer: Self-pay | Admitting: Obstetrics

## 2013-05-05 VITALS — BP 102/68 | HR 97 | Temp 98.2°F | Ht 67.0 in | Wt 167.0 lb

## 2013-05-05 DIAGNOSIS — Z0000 Encounter for general adult medical examination without abnormal findings: Secondary | ICD-10-CM

## 2013-05-05 DIAGNOSIS — Z113 Encounter for screening for infections with a predominantly sexual mode of transmission: Secondary | ICD-10-CM

## 2013-05-05 DIAGNOSIS — Z3046 Encounter for surveillance of implantable subdermal contraceptive: Secondary | ICD-10-CM

## 2013-05-05 DIAGNOSIS — L738 Other specified follicular disorders: Secondary | ICD-10-CM

## 2013-05-05 DIAGNOSIS — L678 Other hair color and hair shaft abnormalities: Secondary | ICD-10-CM

## 2013-05-05 DIAGNOSIS — L739 Follicular disorder, unspecified: Secondary | ICD-10-CM | POA: Insufficient documentation

## 2013-05-05 MED ORDER — CLINDAMYCIN PHOSPHATE 1 % EX SOLN: Freq: Two times a day (BID) | CUTANEOUS | Status: DC

## 2013-05-05 NOTE — Progress Notes (Signed)
Subjective:     Brooke Lucas is a 28 y.o. female here for a routine exam.  Current complaints: Patient is in the office today for a Nexplanon Check. Patient states her arm has a funny feeling like a tingling or like it wants to go numb. Patient states when she leans on her arm it sends a sharp pain from her neck to her fingers. Patient states she thinks it is causing her to have some bloating. Patient states it feels like when she was pregnant and the baby would move from one side to the other.  Patient would also like an annual exam. Personal health questionnaire reviewed: yes.   Gynecologic History No LMP recorded. Patient has had an implant. Contraception: Nexplanon Last Pap: Patient states 2013. 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  Review of Systems Pertinent items are noted in HPI.    Objective:    General appearance: alert and no distress Breasts: normal appearance, no masses or tenderness Abdomen: normal findings: soft, non-tender Pelvic: cervix normal in appearance, external genitalia normal, no adnexal masses or tenderness, no cervical motion tenderness, rectovaginal septum normal, uterus normal size, shape, and consistency and vagina normal without discharge    Assessment:    Healthy female exam.   Folliculitis of mons pubis  Nexplanon surveillance   Plan:    Education reviewed: safe sex/STD prevention and management of  folliculitis. Contraception: Nexplanon. Follow up in: 1 years. Cleocin topical solution Rx.

## 2013-05-05 NOTE — Addendum Note (Signed)
Addended by: Odessa FlemingBOHNE, Russel Morain M on: 05/05/2013 06:07 PM   Modules accepted: Orders

## 2013-05-06 LAB — WET PREP BY MOLECULAR PROBE
Candida species: NEGATIVE
Gardnerella vaginalis: POSITIVE — AB
TRICHOMONAS VAG: NEGATIVE

## 2013-05-06 LAB — HEPATITIS B SURFACE ANTIGEN: HEP B S AG: NEGATIVE

## 2013-05-06 LAB — PAP IG W/ RFLX HPV ASCU

## 2013-05-06 LAB — GC/CHLAMYDIA PROBE AMP
CT Probe RNA: NEGATIVE
GC Probe RNA: NEGATIVE

## 2013-05-06 LAB — HEPATITIS C ANTIBODY: HCV Ab: NEGATIVE

## 2013-05-06 LAB — HIV ANTIBODY (ROUTINE TESTING W REFLEX): HIV: NONREACTIVE

## 2013-05-06 LAB — RPR

## 2013-05-19 ENCOUNTER — Other Ambulatory Visit: Payer: Self-pay | Admitting: *Deleted

## 2013-05-19 DIAGNOSIS — N760 Acute vaginitis: Principal | ICD-10-CM

## 2013-05-19 DIAGNOSIS — B9689 Other specified bacterial agents as the cause of diseases classified elsewhere: Secondary | ICD-10-CM

## 2013-05-19 MED ORDER — METRONIDAZOLE 500 MG PO TABS
500.0000 mg | ORAL_TABLET | Freq: Two times a day (BID) | ORAL | Status: DC
Start: 2013-05-19 — End: 2014-06-14

## 2013-06-08 ENCOUNTER — Other Ambulatory Visit: Payer: Self-pay | Admitting: *Deleted

## 2013-10-21 ENCOUNTER — Ambulatory Visit: Payer: Medicaid Other | Admitting: Obstetrics

## 2013-12-12 ENCOUNTER — Encounter: Payer: Self-pay | Admitting: Obstetrics

## 2014-01-31 ENCOUNTER — Encounter (HOSPITAL_COMMUNITY): Payer: Self-pay | Admitting: Emergency Medicine

## 2014-01-31 ENCOUNTER — Emergency Department (INDEPENDENT_AMBULATORY_CARE_PROVIDER_SITE_OTHER)
Admission: EM | Admit: 2014-01-31 | Discharge: 2014-01-31 | Disposition: A | Discharge status: Home / Self Care | Payer: Medicaid Other | Source: Home / Self Care | Attending: Family Medicine | Admitting: Family Medicine

## 2014-01-31 DIAGNOSIS — J00 Acute nasopharyngitis [common cold]: Secondary | ICD-10-CM

## 2014-01-31 DIAGNOSIS — J040 Acute laryngitis: Secondary | ICD-10-CM

## 2014-01-31 LAB — POCT RAPID STREP A: Streptococcus, Group A Screen (Direct): NEGATIVE

## 2014-01-31 MED ORDER — BENZONATATE 100 MG PO CAPS: 100.0000 mg | ORAL_CAPSULE | Freq: Three times a day (TID) | ORAL | Status: DC | PRN

## 2014-01-31 NOTE — Discharge Instructions (Signed)
Rapid strep negative.  Laryngitis At the top of your windpipe is your voice box. It is the source of your voice. Inside your voice box are 2 bands of muscles called vocal cords. When you breathe, your vocal cords are relaxed and open so that air can get into the lungs. When you decide to say something, these cords come together and vibrate. The sound from these vibrations goes into your throat and comes out through your mouth as sound. Laryngitis is an inflammation of the vocal cords that causes hoarseness, cough, loss of voice, sore throat, and dry throat. Laryngitis can be temporary (acute) or long-term (chronic). Most cases of acute laryngitis improve with time.Chronic laryngitis lasts for more than 3 weeks. CAUSES Laryngitis can often be related to excessive smoking, talking, or yelling, as well as inhalation of toxic fumes and allergies. Acute laryngitis is usually caused by a viral infection, vocal strain, measles or mumps, or bacterial infections. Chronic laryngitis is usually caused by vocal cord strain, vocal cord injury, postnasal drip, growths on the vocal cords, or acid reflux. SYMPTOMS   Cough.  Sore throat.  Dry throat. RISK FACTORS  Respiratory infections.  Exposure to irritating substances, such as cigarette smoke, excessive amounts of alcohol, stomach acids, and workplace chemicals.  Voice trauma, such as vocal cord injury from shouting or speaking too loud. DIAGNOSIS  Your cargiver will perform a physical exam. During the physical exam, your caregiver will examine your throat. The most common sign of laryngitis is hoarseness. Laryngoscopy may be necessary to confirm the diagnosis of this condition. This procedure allows your caregiver to look into the larynx. HOME CARE INSTRUCTIONS  Drink enough fluids to keep your urine clear or pale yellow.  Rest until you no longer have symptoms or as directed by your caregiver.  Breathe in moist air.  Take all medicine as  directed by your caregiver.  Do not smoke.  Talk as little as possible (this includes whispering).  Write on paper instead of talking until your voice is back to normal.  Follow up with your caregiver if your condition has not improved after 10 days. SEEK MEDICAL CARE IF:   You have trouble breathing.  You cough up blood.  You have persistent fever.  You have increasing pain.  You have difficulty swallowing. MAKE SURE YOU:  Understand these instructions.  Will watch your condition.  Will get help right away if you are not doing well or get worse. Document Released: 01/27/2005 Document Revised: 04/21/2011 Document Reviewed: 04/04/2010 [facility] Patient Information 2015 [place] SpringsExitCare, [place]. This information is not intended to replace advice given to you by your health care provider. Make sure you discuss any questions you have with your health care provider.  Upper Respiratory Infection, Adult An upper respiratory infection (URI) is also sometimes known as the common cold. The upper respiratory tract includes the nose, sinuses, throat, trachea, and bronchi. Bronchi are the airways leading to the lungs. Most people improve within 1 week, but symptoms can last up to 2 weeks. A residual cough may last even longer.  CAUSES Many different viruses can infect the tissues lining the upper respiratory tract. The tissues become irritated and inflamed and often become very moist. Mucus production is also common. A cold is contagious. You can easily spread the virus to others by oral contact. This includes kissing, sharing a glass, coughing, or sneezing. Touching your mouth or nose and then touching a surface, which is then touched by another person, can also spread the  virus. SYMPTOMS  Symptoms typically develop 1 to 3 days after you come in contact with a cold virus. Symptoms vary from person to person. They may include:  Runny nose.  Sneezing.  Nasal congestion.  Sinus irritation.  Sore  throat.  Loss of voice (laryngitis).  Cough.  Fatigue.  Muscle aches.  Loss of appetite.  Headache.  Low-grade fever. DIAGNOSIS  You might diagnose your own cold based on familiar symptoms, since most people get a cold 2 to 3 times a year. Your caregiver can confirm this based on your exam. Most importantly, your caregiver can check that your symptoms are not due to another disease such as strep throat, sinusitis, pneumonia, asthma, or epiglottitis. Blood tests, throat tests, and X-rays are not necessary to diagnose a common cold, but they may sometimes be helpful in excluding other more serious diseases. Your caregiver will decide if any further tests are required. RISKS AND COMPLICATIONS  You may be at risk for a more severe case of the common cold if you smoke cigarettes, have chronic heart disease (such as heart failure) or lung disease (such as asthma), or if you have a weakened immune system. The very young and very old are also at risk for more serious infections. Bacterial sinusitis, middle ear infections, and bacterial pneumonia can complicate the common cold. The common cold can worsen asthma and chronic obstructive pulmonary disease (COPD). Sometimes, these complications can require emergency medical care and may be life-threatening. PREVENTION  The best way to protect against getting a cold is to practice good hygiene. Avoid oral or hand contact with people with cold symptoms. Wash your hands often if contact occurs. There is no clear evidence that vitamin C, vitamin E, echinacea, or exercise reduces the chance of developing a cold. However, it is always recommended to get plenty of rest and practice good nutrition. TREATMENT  Treatment is directed at relieving symptoms. There is no cure. Antibiotics are not effective, because the infection is caused by a virus, not by bacteria. Treatment may include:  Increased fluid intake. Sports drinks offer valuable electrolytes, sugars, and  fluids.  Breathing heated mist or steam (vaporizer or shower).  Eating chicken soup or other clear broths, and maintaining good nutrition.  Getting plenty of rest.  Using gargles or lozenges for comfort.  Controlling fevers with ibuprofen or acetaminophen as directed by your caregiver.  Increasing usage of your inhaler if you have asthma. Zinc gel and zinc lozenges, taken in the first 24 hours of the common cold, can shorten the duration and lessen the severity of symptoms. Pain medicines may help with fever, muscle aches, and throat pain. A variety of non-prescription medicines are available to treat congestion and runny nose. Your caregiver can make recommendations and may suggest nasal or lung inhalers for other symptoms.  HOME CARE INSTRUCTIONS   Only take over-the-counter or prescription medicines for pain, discomfort, or fever as directed by your caregiver.  Use a warm mist humidifier or inhale steam from a shower to increase air moisture. This may keep secretions moist and make it easier to breathe.  Drink enough water and fluids to keep your urine clear or pale yellow.  Rest as needed.  Return to work when your temperature has returned to normal or as your caregiver advises. You may need to stay home longer to avoid infecting others. You can also use a face mask and careful hand washing to prevent spread of the virus. SEEK MEDICAL CARE IF:   After the  first few days, you feel you are getting worse rather than better.  You need your caregiver's advice about medicines to control symptoms.  You develop chills, worsening shortness of breath, or brown or red sputum. These may be signs of pneumonia.  You develop yellow or brown nasal discharge or pain in the face, especially when you bend forward. These may be signs of sinusitis.  You develop a fever, swollen neck glands, pain with swallowing, or white areas in the back of your throat. These may be signs of strep throat. SEEK  IMMEDIATE MEDICAL CARE IF:   You have a fever.  You develop severe or persistent headache, ear pain, sinus pain, or chest pain.  You develop wheezing, a prolonged cough, cough up blood, or have a change in your usual mucus (if you have chronic lung disease).  You develop sore muscles or a stiff neck. Document Released: 07/23/2000 Document Revised: 04/21/2011 Document Reviewed: 05/04/2013 [facility]Exit[facility] Patient Information 2015 [place]ExitCare, [place]LL[place]. This information is not intended to replace advice given to you by your health care provider. Make sure you discuss any questions you have with your health care provider.

## 2014-01-31 NOTE — ED Notes (Signed)
Sinus drainage.  Loss of voice, c/o headache, and sore throat, and cough

## 2014-01-31 NOTE — ED Provider Notes (Signed)
CSN: [id number]     Arrival date & time 01/31/14  0915 History   First MD Initiated Contact with Patient 01/31/14 [id number]     Chief Complaint  Patient presents with  . URI   (Consider location/radiation/quality/duration/timing/severity/associated sxs/prior Treatment) Patient is a 28 y.o. female presenting with URI. The history is provided by the patient.  URI Presenting symptoms: congestion, cough, rhinorrhea and sore throat   Presenting symptoms: no ear pain, no facial pain, no fatigue and no fever   Severity:  Moderate Onset quality:  Gradual Duration:  2 days Timing:  Constant Progression:  Unchanged Chronicity:  New Associated symptoms: headaches   Associated symptoms: no arthralgias, no myalgias, no neck pain, no sinus pain, no sneezing, no swollen glands and no wheezing     Past Medical History  Diagnosis Date  . Sickle cell trait   . Trichomonas   . Depression     after loss of son   Past Surgical History  Procedure Laterality Date  . Extraction wisdom     Family History  Problem Relation Age of Onset  . Other Neg Hx    History  Substance Use Topics  . Smoking status: Current Every Day Smoker -- 0.50 packs/day for 10 years    Types: Cigarettes  . Smokeless tobacco: Never Used  . Alcohol Use: No   OB History    Gravida Para Term Preterm AB TAB SAB Ectopic Multiple Living   2 2 2  0 0 0 0 0 0 1     Review of Systems  Constitutional: Negative for fever and fatigue.  HENT: Positive for congestion, rhinorrhea and sore throat. Negative for ear pain and sneezing.   Respiratory: Positive for cough. Negative for wheezing.   Musculoskeletal: Negative for myalgias, arthralgias and neck pain.  Neurological: Positive for headaches.  All other systems reviewed and are negative.   Allergies  Amoxicillin and Penicillins  Home Medications   Prior to Admission medications   Medication Sig Start Date End Date Taking? Authorizing Provider  benzonatate (TESSALON) 100  MG capsule Take 1 capsule (100 mg total) by mouth 3 (three) times daily as needed for cough. 01/31/14   Mathis FareJennifer Lee H Harpreet Signore, PA  clindamycin (CLEOCIN-T) 1 % external solution Apply topically 2 (two) times daily. 05/05/13   Brock Badharles A Harper, MD  etonogestrel (IMPLANON) 68 MG IMPL implant Inject 1 each into the skin once.    Historical Provider, MD  metroNIDAZOLE (FLAGYL) 500 MG tablet Take 1 tablet (500 mg total) by mouth 2 (two) times daily. For 7 days 05/19/13   Brock Badharles A Harper, MD   BP 113/74 mmHg  Pulse 85  Temp(Src) 98.3 F (36.8 C) (Oral)  Resp 16  SpO2 98% Physical Exam  Constitutional: She is oriented to person, place, and time. She appears well-developed and well-nourished. No distress.  HENT:  Head: Normocephalic and atraumatic.  Right Ear: Hearing, tympanic membrane, external ear and ear canal normal.  Left Ear: Hearing, tympanic membrane, external ear and ear canal normal.  Nose: Nose normal.  Mouth/Throat: Uvula is midline and mucous membranes are normal. Posterior oropharyngeal erythema present. No oropharyngeal exudate, posterior oropharyngeal edema or tonsillar abscesses.  Eyes: Conjunctivae are normal. No scleral icterus.  Neck: Normal range of motion. Neck supple.  Cardiovascular: Normal rate, regular rhythm and normal heart sounds.   Pulmonary/Chest: Effort normal and breath sounds normal. No stridor.  Musculoskeletal: Normal range of motion.  Lymphadenopathy:    She has no cervical adenopathy.  Neurological: She  is alert and oriented to person, place, and time.  Skin: Skin is warm and dry.  Psychiatric: She has a normal mood and affect. Her behavior is normal.  Nursing note and vitals reviewed.   ED Course  Procedures (including critical care time) Labs Review Labs Reviewed  POCT RAPID STREP A ([facility] CARE ONLY)    Imaging Review No results found.   MDM   1. Common cold   2. Laryngitis    Rapid strep negative Symptomatic care of common cold at  home.   Ria ClockJennifer Lee H Jai Steil, [place]PA 01/31/14 1007

## 2014-02-02 LAB — CULTURE, GROUP A STREP

## 2014-04-21 ENCOUNTER — Emergency Department (HOSPITAL_COMMUNITY)
Admission: EM | Admit: 2014-04-21 | Discharge: 2014-04-21 | Disposition: A | Discharge status: Home / Self Care | Payer: Medicaid Other | Attending: Emergency Medicine | Admitting: Emergency Medicine

## 2014-04-21 ENCOUNTER — Encounter (HOSPITAL_COMMUNITY): Payer: Self-pay | Admitting: *Deleted

## 2014-04-21 DIAGNOSIS — R112 Nausea with vomiting, unspecified: Secondary | ICD-10-CM | POA: Diagnosis not present

## 2014-04-21 DIAGNOSIS — R69 Illness, unspecified: Secondary | ICD-10-CM

## 2014-04-21 DIAGNOSIS — R630 Anorexia: Secondary | ICD-10-CM | POA: Diagnosis not present

## 2014-04-21 DIAGNOSIS — Z8659 Personal history of other mental and behavioral disorders: Secondary | ICD-10-CM | POA: Diagnosis not present

## 2014-04-21 DIAGNOSIS — R52 Pain, unspecified: Secondary | ICD-10-CM | POA: Diagnosis present

## 2014-04-21 DIAGNOSIS — J111 Influenza due to unidentified influenza virus with other respiratory manifestations: Secondary | ICD-10-CM | POA: Diagnosis not present

## 2014-04-21 DIAGNOSIS — R1084 Generalized abdominal pain: Secondary | ICD-10-CM | POA: Diagnosis not present

## 2014-04-21 DIAGNOSIS — Z862 Personal history of diseases of the blood and blood-forming organs and certain disorders involving the immune mechanism: Secondary | ICD-10-CM | POA: Insufficient documentation

## 2014-04-21 DIAGNOSIS — Z8619 Personal history of other infectious and parasitic diseases: Secondary | ICD-10-CM | POA: Diagnosis not present

## 2014-04-21 DIAGNOSIS — Z880 Allergy status to penicillin: Secondary | ICD-10-CM | POA: Insufficient documentation

## 2014-04-21 DIAGNOSIS — Z720 Tobacco use: Secondary | ICD-10-CM | POA: Diagnosis not present

## 2014-04-21 DIAGNOSIS — Z3202 Encounter for pregnancy test, result negative: Secondary | ICD-10-CM | POA: Diagnosis not present

## 2014-04-21 LAB — CBC WITH DIFFERENTIAL/PLATELET
BASOS PCT: 0 % (ref 0–1)
Basophils Absolute: 0 10*3/uL (ref 0.0–0.1)
EOS ABS: 0 10*3/uL (ref 0.0–0.7)
Eosinophils Relative: 0 % (ref 0–5)
HEMATOCRIT: 38.9 % (ref 36.0–46.0)
Hemoglobin: 13.5 g/dL (ref 12.0–15.0)
Lymphocytes Relative: 8 % — ABNORMAL LOW (ref 12–46)
Lymphs Abs: 0.3 10*3/uL — ABNORMAL LOW (ref 0.7–4.0)
MCH: 29.7 pg (ref 26.0–34.0)
MCHC: 34.7 g/dL (ref 30.0–36.0)
MCV: 85.5 fL (ref 78.0–100.0)
MONO ABS: 0.3 10*3/uL (ref 0.1–1.0)
Monocytes Relative: 10 % (ref 3–12)
Neutro Abs: 2.6 10*3/uL (ref 1.7–7.7)
Neutrophils Relative %: 82 % — ABNORMAL HIGH (ref 43–77)
Platelets: 206 10*3/uL (ref 150–400)
RBC: 4.55 MIL/uL (ref 3.87–5.11)
RDW: 12.5 % (ref 11.5–15.5)
WBC: 3.2 10*3/uL — ABNORMAL LOW (ref 4.0–10.5)

## 2014-04-21 LAB — URINE MICROSCOPIC-ADD ON

## 2014-04-21 LAB — BASIC METABOLIC PANEL
Anion gap: 10 (ref 5–15)
BUN: 8 mg/dL (ref 6–23)
CALCIUM: 9 mg/dL (ref 8.4–10.5)
CO2: 22 mmol/L (ref 19–32)
CREATININE: 0.93 mg/dL (ref 0.50–1.10)
Chloride: 105 mmol/L (ref 96–112)
GFR calc Af Amer: >90 mL/min (ref >90)
GFR calc non Af Amer: 83 mL/min — ABNORMAL LOW (ref >90)
GLUCOSE: 98 mg/dL (ref 70–99)
Potassium: 3.7 mmol/L (ref 3.5–5.1)
Sodium: 137 mmol/L (ref 135–145)

## 2014-04-21 LAB — URINALYSIS, ROUTINE W REFLEX MICROSCOPIC
Glucose, UA: NEGATIVE mg/dL
LEUKOCYTES UA: NEGATIVE
Nitrite: NEGATIVE
PH: 5.5 (ref 5.0–8.0)
PROTEIN: NEGATIVE mg/dL
Specific Gravity, Urine: 1.026 (ref 1.005–1.030)
Urobilinogen, UA: 1 mg/dL (ref 0.0–1.0)

## 2014-04-21 LAB — POC URINE PREG, ED: Preg Test, Ur: NEGATIVE

## 2014-04-21 MED ORDER — ONDANSETRON HCL 4 MG PO TABS: 4.0000 mg | ORAL_TABLET | Freq: Four times a day (QID) | ORAL | Status: DC

## 2014-04-21 MED ORDER — HYDROCODONE-ACETAMINOPHEN 5-325 MG PO TABS: 1.0000 | ORAL_TABLET | Freq: Four times a day (QID) | ORAL | Status: DC | PRN

## 2014-04-21 MED ORDER — ONDANSETRON HCL 4 MG/2ML IJ SOLN
4.0000 mg | Freq: Once | INTRAMUSCULAR | Status: AC
  Administered 2014-04-21: 4 mg via INTRAVENOUS
  Filled 2014-04-21: qty 2

## 2014-04-21 MED ORDER — SODIUM CHLORIDE 0.9 % IV BOLUS (SEPSIS)
1000.0000 mL | Freq: Once | INTRAVENOUS | Status: AC
  Administered 2014-04-21: 1000 mL via INTRAVENOUS

## 2014-04-21 MED ORDER — MORPHINE SULFATE 4 MG/ML IJ SOLN
4.0000 mg | Freq: Once | INTRAMUSCULAR | Status: AC
Start: 2014-04-21 — End: 2014-04-21
  Administered 2014-04-21: 4 mg via INTRAVENOUS
  Filled 2014-04-21: qty 1

## 2014-04-21 MED ORDER — KETOROLAC TROMETHAMINE 30 MG/ML IJ SOLN
30.0000 mg | Freq: Once | INTRAMUSCULAR | Status: AC
  Administered 2014-04-21: 30 mg via INTRAVENOUS
  Filled 2014-04-21: qty 1

## 2014-04-21 NOTE — Discharge Instructions (Signed)

## 2014-04-21 NOTE — ED Notes (Signed)
PT given hot packs for neck pain.

## 2014-04-21 NOTE — ED Notes (Signed)
Pt c/o headache, body aches, abdominal pain and vomiting since Thursday.  Also c/o some diarrhea today.

## 2014-04-21 NOTE — ED Provider Notes (Signed)
CSN: [id number]     Arrival date & time 04/21/14  0732 History   First MD Initiated Contact with Patient 04/21/14 [phone]     Chief Complaint  Patient presents with  . Abdominal Pain    emesis  . Generalized Body Aches     (Consider location/radiation/quality/duration/timing/severity/associated sxs/prior Treatment) Patient is a 29 y.o. female presenting with flu symptoms. The history is provided by the patient. No language interpreter was used.  Influenza Presenting symptoms: cough, diarrhea, fatigue, fever, headache, myalgias, nausea, rhinorrhea, sore throat and vomiting   Presenting symptoms: no shortness of breath   Severity:  Moderate Onset quality:  Sudden Duration:  1 day Progression:  Unchanged Chronicity:  New Relieved by:  Nothing Worsened by:  Nothing tried Ineffective treatments:  None tried Associated symptoms: chills, decreased appetite and decreased physical activity   Associated symptoms: no congestion, no neck stiffness and no witnessed syncope   Risk factors: sick contacts     Past Medical History  Diagnosis Date  . Sickle cell trait   . Trichomonas   . Depression     after loss of son   Past Surgical History  Procedure Laterality Date  . Extraction wisdom     Family History  Problem Relation Age of Onset  . Other Neg Hx    History  Substance Use Topics  . Smoking status: Current Every Day Smoker -- 0.50 packs/day for 10 years    Types: Cigarettes  . Smokeless tobacco: Never Used  . Alcohol Use: No   OB History    Gravida Para Term Preterm AB TAB SAB Ectopic Multiple Living   2 2 2  0 0 0 0 0 0 1     Review of Systems  Constitutional: Positive for fever, chills, fatigue and decreased appetite. Negative for diaphoresis, activity change and appetite change.  HENT: Positive for rhinorrhea and sore throat. Negative for congestion and facial swelling.   Eyes: Negative for photophobia and discharge.  Respiratory: Positive for cough. Negative for  chest tightness and shortness of breath.   Cardiovascular: Negative for chest pain, palpitations and leg swelling.  Gastrointestinal: Positive for nausea, vomiting, abdominal pain and diarrhea.  Endocrine: Negative for polydipsia and polyuria.  Genitourinary: Negative for dysuria, frequency, difficulty urinating and pelvic pain.  Musculoskeletal: Positive for myalgias. Negative for back pain, arthralgias, neck pain and neck stiffness.  Skin: Negative for color change and wound.  Allergic/Immunologic: Negative for immunocompromised state.  Neurological: Positive for headaches. Negative for facial asymmetry, weakness and numbness.  Hematological: Does not bruise/bleed easily.  Psychiatric/Behavioral: Negative for confusion and agitation.      Allergies  Amoxicillin and Penicillins  Home Medications   Prior to Admission medications   Medication Sig Start Date End Date Taking? Authorizing Provider  benzonatate (TESSALON) 100 MG capsule Take 1 capsule (100 mg total) by mouth 3 (three) times daily as needed for cough. 01/31/14   Mathis FareJennifer Lee H Presson, PA  clindamycin (CLEOCIN-T) 1 % external solution Apply topically 2 (two) times daily. 05/05/13   Brock Badharles A Harper, MD  etonogestrel (IMPLANON) 68 MG IMPL implant Inject 1 each into the skin once.    Historical Provider, MD  metroNIDAZOLE (FLAGYL) 500 MG tablet Take 1 tablet (500 mg total) by mouth 2 (two) times daily. For 7 days 05/19/13   Brock Badharles A Harper, MD   BP 128/69 mmHg  Pulse 81  Temp(Src) 99.6 F (37.6 C) (Oral)  Resp 14  Ht 5\' 6"  (1.676 m)  Wt 163 lb (  73.936 kg)  BMI 26.32 kg/m2  SpO2 98% Physical Exam  Constitutional: She is oriented to person, place, and time. She appears well-developed and well-nourished. No distress.  HENT:  Head: Normocephalic and atraumatic.  Mouth/Throat: No oropharyngeal exudate.  Eyes: Pupils are equal, round, and reactive to light.  Neck: Normal range of motion. Neck supple.  Cardiovascular:  Normal rate, regular rhythm and normal heart sounds.  Exam reveals no gallop and no friction rub.   No murmur heard. Pulmonary/Chest: Effort normal and breath sounds normal. No respiratory distress. She has no wheezes. She has no rales.  Abdominal: Soft. Bowel sounds are normal. She exhibits no distension and no mass. There is generalized tenderness. There is no rigidity, no rebound and no guarding.  Musculoskeletal: Normal range of motion. She exhibits no edema or tenderness.  Neurological: She is alert and oriented to person, place, and time.  Skin: Skin is warm and dry.  Psychiatric: She has a normal mood and affect.    ED Course  Procedures (including critical care time) Labs Review Labs Reviewed  CBC WITH DIFFERENTIAL/PLATELET  URINALYSIS, ROUTINE W REFLEX MICROSCOPIC  BASIC METABOLIC PANEL  POC URINE PREG, ED    Imaging Review No results found.   EKG Interpretation None      MDM   Final diagnoses:  None    SUBJECTIVE:  Brooke Lucas is a 30 y.o. female who present complaining of flu-like symptoms: fevers, chills, myalgias, congestion, sore throat and cough, n/v, and 1 episode of d/a for 2 days. Denies dyspnea or wheezing.  OBJECTIVE: Appears moderately ill but not toxic; temperature as noted in vitals. Ears normal. Throat and pharynx normal.  Neck supple. No adenopathy in the neck. Sinuses non tender. The chest is clear. She has mild generalized ttp w/o rebound or guarding.   ASSESSMENT: Influenza  PLAN: Symptomatic therapy suggested: rest, increase fluids, OTC acetaminophen Return to ED prn if these symptoms worsen or fail to improve as anticipated.     Toy Cookey, MD 04/22/14 [phone]

## 2014-05-08 ENCOUNTER — Ambulatory Visit: Payer: Medicaid Other | Admitting: Obstetrics

## 2014-05-11 ENCOUNTER — Ambulatory Visit: Payer: Medicaid Other | Admitting: Obstetrics

## 2014-05-25 ENCOUNTER — Ambulatory Visit: Payer: Medicaid Other | Admitting: Certified Nurse Midwife

## 2014-06-08 ENCOUNTER — Encounter (HOSPITAL_COMMUNITY): Payer: Self-pay | Admitting: Emergency Medicine

## 2014-06-08 ENCOUNTER — Emergency Department (HOSPITAL_COMMUNITY)
Admission: EM | Admit: 2014-06-08 | Discharge: 2014-06-08 | Disposition: A | Discharge status: Home / Self Care | Payer: Medicaid Other | Attending: Emergency Medicine | Admitting: Emergency Medicine

## 2014-06-08 DIAGNOSIS — J040 Acute laryngitis: Secondary | ICD-10-CM | POA: Diagnosis not present

## 2014-06-08 DIAGNOSIS — Z793 Long term (current) use of hormonal contraceptives: Secondary | ICD-10-CM | POA: Insufficient documentation

## 2014-06-08 DIAGNOSIS — Z8619 Personal history of other infectious and parasitic diseases: Secondary | ICD-10-CM | POA: Insufficient documentation

## 2014-06-08 DIAGNOSIS — Z720 Tobacco use: Secondary | ICD-10-CM | POA: Diagnosis not present

## 2014-06-08 DIAGNOSIS — Z8659 Personal history of other mental and behavioral disorders: Secondary | ICD-10-CM | POA: Insufficient documentation

## 2014-06-08 DIAGNOSIS — Z880 Allergy status to penicillin: Secondary | ICD-10-CM | POA: Insufficient documentation

## 2014-06-08 DIAGNOSIS — Z862 Personal history of diseases of the blood and blood-forming organs and certain disorders involving the immune mechanism: Secondary | ICD-10-CM | POA: Insufficient documentation

## 2014-06-08 DIAGNOSIS — R05 Cough: Secondary | ICD-10-CM | POA: Diagnosis present

## 2014-06-08 NOTE — ED Provider Notes (Signed)
CSN: [id number]     Arrival date & time 06/08/14  1039 History  This chart was scribed for non-physician practitioner, Langston MaskerKaren Sofia, PA-C, working with Rolan BuccoMelanie Belfi, MD, by Ronney LionSuzanne Le, ED Scribe. This patient was seen in room TR05C/TR05C and the patient's care was started at 11:33 AM.    Chief Complaint  Patient presents with  . Laryngitis   The history is provided by the patient. No language interpreter was used.     HPI Comments: Ruben GottronJovona W Engelken is a 29 y.o. female who presents to the Emergency Department complaining of gradually worsening, constant vocal change that began in the past 3 days ago. Patient had gone out to "party" 4 days ago and spent a lot of time arguing over the past 3 days, which involved frequent yelling. She states her larynx feels swollen, and she has an accompanying cough. Patient states her grandmother told her to come here to check for any damage to her vocal cords. She is a current smoker.  Past Medical History  Diagnosis Date  . Sickle cell trait   . Trichomonas   . Depression     after loss of son   Past Surgical History  Procedure Laterality Date  . Extraction wisdom     Family History  Problem Relation Age of Onset  . Other Neg Hx    History  Substance Use Topics  . Smoking status: Current Every Day Smoker -- 0.50 packs/day for 10 years    Types: Cigarettes  . Smokeless tobacco: Never Used  . Alcohol Use: No   OB History    Gravida Para Term Preterm AB TAB SAB Ectopic Multiple Living   2 2 2  0 0 0 0 0 0 1     Review of Systems  HENT: Positive for voice change.   Respiratory: Positive for cough.   All other systems reviewed and are negative.   Allergies  Amoxicillin and Penicillins  Home Medications   Prior to Admission medications   Medication Sig Start Date End Date Taking? Authorizing Provider  benzonatate (TESSALON) 100 MG capsule Take 1 capsule (100 mg total) by mouth 3 (three) times daily as needed for cough. Patient not  taking: Reported on 04/21/2014 01/31/14   Mathis FareJennifer Lee H Presson, PA  clindamycin (CLEOCIN-T) 1 % external solution Apply topically 2 (two) times daily. Patient not taking: Reported on 04/21/2014 05/05/13   Brock Badharles A Harper, MD  etonogestrel (IMPLANON) 68 MG IMPL implant Inject 1 each into the skin once.    Historical Provider, MD  HYDROcodone-acetaminophen (NORCO) 5-325 MG per tablet Take 1 tablet by mouth every 6 (six) hours as needed. 04/21/14   Toy CookeyMegan Docherty, MD  metroNIDAZOLE (FLAGYL) 500 MG tablet Take 1 tablet (500 mg total) by mouth 2 (two) times daily. For 7 days Patient not taking: Reported on 04/21/2014 05/19/13   Brock Badharles A Harper, MD  ondansetron (ZOFRAN) 4 MG tablet Take 1 tablet (4 mg total) by mouth every 6 (six) hours. 04/21/14   Toy CookeyMegan Docherty, MD  Pseudoeph-Doxylamine-DM-APAP (NYQUIL MULTI-SYMPTOM PO) Take 2 tablets by mouth daily as needed (pain).     Historical Provider, MD   BP 121/66 mmHg  Pulse 84  Temp(Src) 98.4 F (36.9 C) (Oral)  Resp 18  SpO2 100% Physical Exam  Constitutional: She is oriented to person, place, and time. She appears well-developed and well-nourished. No distress.  HENT:  Head: Normocephalic and atraumatic.  Mouth/Throat: Posterior oropharyngeal erythema present.  Slight erythema to posterior pharynx.  Eyes: Conjunctivae  and EOM are normal.  Neck: Neck supple. No tracheal deviation present.  Cardiovascular: Normal rate.   Pulmonary/Chest: Effort normal. No respiratory distress.  Musculoskeletal: Normal range of motion.  Neurological: She is alert and oriented to person, place, and time.  Skin: Skin is warm and dry.  Psychiatric: She has a normal mood and affect. Her behavior is normal.  Nursing note and vitals reviewed.   ED Course  Procedures (including critical care time)  DIAGNOSTIC STUDIES: Oxygen Saturation is 100% on RA, normal by my interpretation.    COORDINATION OF CARE: 11:35 AM - Discussed treatment plan with pt at bedside which  includes Cepacol, hot tea, lemon, lozenges, and refraining from vocal use or smoking, and pt agreed to plan.    MDM   Final diagnoses:  Laryngitis  cepacol No yelling No smoking   I personally performed the services in this documentation, which was scribed in my presence.  The recorded information has been reviewed and considered.   Barnet Pall.    Lonia Skinner [place], PA-C 06/08/14 1426  Rolan Bucco, MD 06/08/14 1524

## 2014-06-08 NOTE — ED Notes (Addendum)
Pt has been arguing for several days and "partying" this weekend. Has been trying to rest voice. Denies swelling. Pt is able to whisper; pt is current smoker.

## 2014-06-08 NOTE — Discharge Instructions (Signed)
Laryngitis °At the top of your windpipe is your voice box. It is the source of your voice. Inside your voice box are 2 bands of muscles called vocal cords. When you breathe, your vocal cords are relaxed and open so that air can get into the lungs. When you decide to say something, these cords come together and vibrate. The sound from these vibrations goes into your throat and comes out through your mouth as sound.  °Laryngitis is an inflammation of the vocal cords that causes hoarseness, cough, loss of voice, sore throat, and dry throat. Laryngitis can be temporary (acute) or long-term (chronic). Most cases of acute laryngitis improve with time.Chronic laryngitis lasts for more than 3 weeks. °CAUSES °Laryngitis can often be related to excessive smoking, talking, or yelling, as well as inhalation of toxic fumes and allergies. Acute laryngitis is usually caused by a viral infection, vocal strain, measles or mumps, or bacterial infections. Chronic laryngitis is usually caused by vocal cord strain, vocal cord injury, postnasal drip, growths on the vocal cords, or acid reflux. °SYMPTOMS  °· Cough. °· Sore throat. °· Dry throat. °RISK FACTORS °· Respiratory infections. °· Exposure to irritating substances, such as cigarette smoke, excessive amounts of alcohol, stomach acids, and workplace chemicals. °· Voice trauma, such as vocal cord injury from shouting or speaking too loud. °DIAGNOSIS  °Your cargiver will perform a physical exam. During the physical exam, your caregiver will examine your throat. The most common sign of laryngitis is hoarseness. Laryngoscopy may be necessary to confirm the diagnosis of this condition. This procedure allows your caregiver to look into the larynx. °HOME CARE INSTRUCTIONS °· Drink enough fluids to keep your urine clear or pale yellow. °· Rest until you no longer have symptoms or as directed by your caregiver. °· Breathe in moist air. °· Take all medicine as directed by your  caregiver. °· Do not smoke. °· Talk as little as possible (this includes whispering). °· Write on paper instead of talking until your voice is back to normal. °· Follow up with your caregiver if your condition has not improved after 10 days. °SEEK MEDICAL CARE IF:  °· You have trouble breathing. °· You cough up blood. °· You have persistent fever. °· You have increasing pain. °· You have difficulty swallowing. °MAKE SURE YOU: °· Understand these instructions. °· Will watch your condition. °· Will get help right away if you are not doing well or get worse. °Document Released: 01/27/2005 Document Revised: 04/21/2011 Document Reviewed: 04/04/2010 °ExitCare® Patient Information ©2015 [employer]. This information is not intended to replace advice given to you by your health care provider. Make sure you discuss any questions you have with your health care provider. ° °

## 2014-06-14 ENCOUNTER — Encounter: Payer: Self-pay | Admitting: Certified Nurse Midwife

## 2014-06-14 ENCOUNTER — Ambulatory Visit (INDEPENDENT_AMBULATORY_CARE_PROVIDER_SITE_OTHER): Payer: Medicaid Other | Admitting: Certified Nurse Midwife

## 2014-06-14 VITALS — BP 125/80 | HR 59 | Temp 97.9°F | Ht 66.0 in | Wt 165.0 lb

## 2014-06-14 DIAGNOSIS — Z716 Tobacco abuse counseling: Secondary | ICD-10-CM

## 2014-06-14 DIAGNOSIS — Z720 Tobacco use: Secondary | ICD-10-CM

## 2014-06-14 DIAGNOSIS — Z0000 Encounter for general adult medical examination without abnormal findings: Secondary | ICD-10-CM

## 2014-06-14 DIAGNOSIS — Z01419 Encounter for gynecological examination (general) (routine) without abnormal findings: Secondary | ICD-10-CM

## 2014-06-14 LAB — COMPREHENSIVE METABOLIC PANEL
ALK PHOS: 38 U/L — AB (ref 39–117)
ALT: 10 U/L (ref 0–35)
AST: 12 U/L (ref 0–37)
Albumin: 3.9 g/dL (ref 3.5–5.2)
BUN: 6 mg/dL (ref 6–23)
CO2: 22 mEq/L (ref 19–32)
Calcium: 8.5 mg/dL (ref 8.4–10.5)
Chloride: 105 mEq/L (ref 96–112)
Creat: 0.89 mg/dL (ref 0.50–1.10)
GLUCOSE: 77 mg/dL (ref 70–99)
Potassium: 3.9 mEq/L (ref 3.5–5.3)
SODIUM: 136 meq/L (ref 135–145)
Total Bilirubin: 0.6 mg/dL (ref 0.2–1.2)
Total Protein: 6.1 g/dL (ref 6.0–8.3)

## 2014-06-14 LAB — TSH: TSH: 1.335 u[IU]/mL (ref 0.350–4.500)

## 2014-06-14 MED ORDER — VARENICLINE TARTRATE 0.5 MG PO TABS: 0.5000 mg | ORAL_TABLET | Freq: Two times a day (BID) | ORAL | Status: DC

## 2014-06-14 NOTE — Progress Notes (Signed)
Patient ID: Brooke GottronJovona W Pethtel, female   DOB: 14-Oct-1985, 29 y.o.   MRN: [id number]   Subjective:      Brooke Lucas is a 29 y.o. female here for a routine exam.  Current complaints: carpel tunnel symptoms, current smoker, has family hx of cancer.  Lost her son in 2006, was at Dr. Tawny HoppingMarshalls office, Dr. Clearance CootsHarper delivered the stillborn son.  Has been having a depression set back, smoking about a pack a day now.  Started smoking when she was 29 years old, has been smoking for 15 years.  Brother had osteosarcoma and lung, passed away.  Son ASD defect.  She has sickle cell trait.   Tends to stay in doors, not employed or going to school.  Had been working for [employer].  Desires to see a specialist for her hands.  Also desires a new PCP as she has not seen the one listed on her card.  Desires to have the Nexplanon removed and Mirena placed in June/July time frame.   Denies suicidal/homicidal ideation.  Desires STD screening  Personal health questionnaire:  Is patient Ashkenazi Jewish, have a family history of breast and/or ovarian cancer: no Is there a family history of uterine cancer diagnosed at age < 5550, gastrointestinal cancer, urinary tract cancer, family member who is a Personnel officerLynch syndrome-associated carrier: no Is the patient overweight and hypertensive, family history of diabetes, personal history of gestational diabetes, preeclampsia or PCOS: yes Is patient over 4155, have PCOS,  family history of premature CHD under age 28, diabetes, smoke, have hypertension or peripheral artery disease:  yes At any time, has a partner hit, kicked or otherwise hurt or frightened you?: no Over the past 2 weeks, have you felt down, depressed or hopeless?: yes Over the past 2 weeks, have you felt little interest or pleasure in doing things?:yes   Gynecologic 2  No LMP recorded. Patient has had an implant. Contraception: Nexplanon and it was placed 09/01/11.   Last Pap: 05/05/13. Results were: normal Last  mammogram: N/A.   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 Sickle cell trait   . Trichomonas   . Depression     after loss of son    Past Surgical History  Procedure Laterality Date  . Extraction wisdom       Current outpatient prescriptions:  .  etonogestrel (IMPLANON) 68 MG IMPL implant, Inject 1 each into the skin once., Disp: , Rfl:  .  varenicline (CHANTIX) 0.5 MG tablet, Take 1 tablet (0.5 mg total) by mouth 2 (two) times daily., Disp: 60 tablet, Rfl: 5 Allergies  Allergen Reactions  . Amoxicillin Anaphylaxis  . Penicillins Anaphylaxis    History  Substance Use Topics  . Smoking status: Current Every Day Smoker -- 0.50 packs/day for 10 years    Types: Cigarettes  . Smokeless tobacco: Never Used  . Alcohol Use: No     Comment: Socially    Family History  Problem Relation  Age of Onset  . Other Neg Hx       Review of Systems  Constitutional: negative for fatigue and weight loss Respiratory: negative for cough and wheezing Cardiovascular: negative for chest pain, fatigue and palpitations Gastrointestinal: negative for abdominal pain and change in bowel habits Musculoskeletal:negative for myalgias Neurological: negative for gait problems and tremors Behavioral/Psych: negative for abusive relationship, depression Endocrine: negative for temperature intolerance   Genitourinary:negative for abnormal menstrual periods, genital lesions, hot flashes, sexual problems and vaginal discharge Integument/breast: negative for breast lump, breast tenderness, nipple discharge and skin lesion(s)    Objective:       BP 125/80 mmHg  Pulse 59   Temp(Src) 97.9 F (36.6 C)  Ht 5\' 6"  (1.676 m)  Wt 74.844 kg (165 lb)  BMI 26.64 kg/m2 General:   alert  Skin:   no rash or abnormalities  Lungs:   clear to auscultation bilaterally  Heart:   regular rate and rhythm, S1, S2 normal, no murmur, click, rub or gallop  Breasts:   normal without suspicious masses, skin or nipple changes or axillary nodes  Abdomen:  normal findings: no organomegaly, soft, non-tender and no hernia  Pelvis:  External genitalia: normal general appearance Urinary system: urethral meatus normal and bladder without fullness, nontender Vaginal: normal without tenderness, induration or masses Cervix: normal appearance Adnexa: normal bimanual exam Uterus: anteverted and non-tender, normal size   Lab Review Urine pregnancy test Labs reviewed yes Radiologic studies reviewed no  50% of 30 min visit spent on counseling and coordination of care.   Assessment:    Healthy female exam.   Smoking Cessation Contraception counseling   Plan:    Education reviewed: calcium supplements, depression evaluation, low fat, low cholesterol diet, safe sex/STD prevention, self breast exams, skin cancer screening, smoking cessation and weight bearing exercise. Contraception: Nexplanon. Follow up in: 1 year. for annual.  June/July for contraception ?Mirena and nexplanon removal.  Journeys counseling  Meds ordered this encounter  Medications  . varenicline (CHANTIX) 0.5 MG tablet    Sig: Take 1 tablet (0.5 mg total) by mouth 2 (two) times daily.    Dispense:  60 tablet    Refill:  5   Orders Placed This Encounter  Procedures  . SureSwab, Vaginosis/Vaginitis Plus  . Hepatitis B surface antigen  . RPR  . Hepatitis C antibody  . HIV antibody (with reflex)  . CBC with Differential/Platelet  . Comprehensive metabolic panel  . TSH  . Ambulatory referral to Internal Medicine    Referral Priority:  Routine    Referral Type:  Consultation    Referral Reason:  Specialty  Services Required    Requested Specialty:  Internal Medicine    Number of Visits Requested:  1

## 2014-06-15 ENCOUNTER — Other Ambulatory Visit: Payer: Self-pay | Admitting: *Deleted

## 2014-06-15 ENCOUNTER — Other Ambulatory Visit: Payer: Self-pay | Admitting: Certified Nurse Midwife

## 2014-06-15 LAB — CBC WITH DIFFERENTIAL/PLATELET
BASOS PCT: 0 % (ref 0–1)
Basophils Absolute: 0 10*3/uL (ref 0.0–0.1)
EOS ABS: 0 10*3/uL (ref 0.0–0.7)
Eosinophils Relative: 1 % (ref 0–5)
HCT: 39.7 % (ref 36.0–46.0)
HEMOGLOBIN: 13.1 g/dL (ref 12.0–15.0)
LYMPHS ABS: 1.7 10*3/uL (ref 0.7–4.0)
Lymphocytes Relative: 55 % — ABNORMAL HIGH (ref 12–46)
MCH: 29.7 pg (ref 26.0–34.0)
MCHC: 33 g/dL (ref 30.0–36.0)
MCV: 90 fL (ref 78.0–100.0)
MPV: 9.9 fL (ref 8.6–12.4)
Monocytes Absolute: 0.3 10*3/uL (ref 0.1–1.0)
Monocytes Relative: 11 % (ref 3–12)
Neutro Abs: 1 10*3/uL — ABNORMAL LOW (ref 1.7–7.7)
Neutrophils Relative %: 33 % — ABNORMAL LOW (ref 43–77)
Platelets: 257 10*3/uL (ref 150–400)
RBC: 4.41 MIL/uL (ref 3.87–5.11)
RDW: 13.8 % (ref 11.5–15.5)
WBC: 3.1 10*3/uL — ABNORMAL LOW (ref 4.0–10.5)

## 2014-06-15 LAB — PAP IG W/ RFLX HPV ASCU

## 2014-06-15 LAB — HIV ANTIBODY (ROUTINE TESTING W REFLEX): HIV 1&2 Ab, 4th Generation: NONREACTIVE

## 2014-06-15 LAB — HEPATITIS B SURFACE ANTIGEN: HEP B S AG: NEGATIVE

## 2014-06-15 LAB — HEPATITIS C ANTIBODY: HCV Ab: NEGATIVE

## 2014-06-15 LAB — RPR

## 2014-06-15 MED ORDER — METRONIDAZOLE 500 MG PO TABS: 500.0000 mg | ORAL_TABLET | Freq: Two times a day (BID) | ORAL | Status: DC

## 2014-06-15 NOTE — Progress Notes (Signed)
Pt called to office stating that she was to get a Rx at time of visit for bacterial infection.  Pt made aware that Rx was not sent. Pt advised f/u will be made with R. Denney, CNM if approved would be sent to pharmacy. After review with CNM, Metronidazole was sent to pharmacy.

## 2014-06-20 ENCOUNTER — Telehealth: Payer: Self-pay | Admitting: *Deleted

## 2014-06-20 LAB — SURESWAB, VAGINOSIS/VAGINITIS PLUS
Atopobium vaginae: DETECTED Log (cells/mL)
C. GLABRATA, DNA: NOT DETECTED
C. albicans, DNA: NOT DETECTED
C. parapsilosis, DNA: NOT DETECTED
C. trachomatis RNA, TMA: NOT DETECTED
C. tropicalis, DNA: NOT DETECTED
Gardnerella vaginalis: 7.8 Log (cells/mL)
LACTOBACILLUS SPECIES: NOT DETECTED Log (cells/mL)
N. gonorrhoeae RNA, TMA: DETECTED — AB
T. VAGINALIS RNA, QL TMA: NOT DETECTED

## 2014-06-20 NOTE — Telephone Encounter (Signed)
Pt called to office stating that pharmacy has not received PA for her Chantix Rx.   11:30, call to pharmacy.  LM making them aware of change in Rx to Chantix start pack and continuation pack.  PA from was faxed to pharmacy with this noted as well as product selection approval.   Pt was then called and made aware of call to pharmacy. Pt advised to f/u with pharmacy later today and if any other problems to call office. Pt states understanding.

## 2014-07-07 ENCOUNTER — Other Ambulatory Visit: Payer: Self-pay | Admitting: *Deleted

## 2014-07-07 DIAGNOSIS — A549 Gonococcal infection, unspecified: Secondary | ICD-10-CM

## 2014-07-07 MED ORDER — AZITHROMYCIN 250 MG PO TABS: 1000.0000 mg | ORAL_TABLET | Freq: Once | ORAL | Status: DC

## 2014-07-07 NOTE — Progress Notes (Signed)
Rx for +GC See lab note

## 2014-07-11 ENCOUNTER — Ambulatory Visit: Payer: Self-pay

## 2014-07-14 ENCOUNTER — Ambulatory Visit (INDEPENDENT_AMBULATORY_CARE_PROVIDER_SITE_OTHER): Payer: Medicaid Other | Admitting: *Deleted

## 2014-07-14 ENCOUNTER — Emergency Department (HOSPITAL_COMMUNITY)
Admission: EM | Admit: 2014-07-14 | Discharge: 2014-07-14 | Disposition: A | Discharge status: Home / Self Care | Payer: Medicaid Other

## 2014-07-14 ENCOUNTER — Telehealth: Payer: Self-pay | Admitting: *Deleted

## 2014-07-14 VITALS — BP 111/69 | HR 75 | Wt 161.0 lb

## 2014-07-14 DIAGNOSIS — A549 Gonococcal infection, unspecified: Secondary | ICD-10-CM

## 2014-07-14 MED ORDER — DIPHENHYDRAMINE HCL 50 MG/ML IJ SOLN
50.0000 mg | Freq: Once | INTRAMUSCULAR | Status: AC
  Administered 2014-07-14: 50 mg via INTRAVENOUS

## 2014-07-14 MED ORDER — CEFTRIAXONE SODIUM 1 G IJ SOLR
250.0000 mg | Freq: Once | INTRAMUSCULAR | Status: AC
  Administered 2014-07-14: 250 mg via INTRAMUSCULAR

## 2014-07-14 NOTE — Progress Notes (Signed)
Pt is in office today for Rocephin injection for +GC.  Pt states that she has picked up antibiotic at pharmacy and has taken.   It is noted that pt has had anaphylaxis reaction with penicillins in past.  Pt states that she has taken Amoxicillin all her life and has not had any reaction that she recalls.  Pt states that allergy listed was from hospital visit in the past.  Pt states that she had taken several different medication prior to being seen at hospital.  Pt states that allergy was listed after hospital visit. Pt allergies were reviewed with Dr Clearance CootsHarper after talking with pt.  Dr Clearance CootsHarper has ordered Rocephin injection along with Benadryl 50mg .   Pt was advised to watch for any signs of allergic reaction.  Pt was advised to seek medical attention if any signs of allergic reaction.   Pt states understanding.

## 2014-07-14 NOTE — Telephone Encounter (Signed)
Pt had called and spoke with after hour nurse.  Pt states that she was having a hard time breathing and having dry mouth.  Pt made nurse aware of visit in office today.  Pt was given Rocephin injection and Benadryl injection.  Pt was advised to seek medical attention at time of appt if any allergic reaction.  Dr Clearance CootsHarper was made aware of nurse call.  Dr Clearance CootsHarper contacted pt regarding call and symptoms.  Pt states that she feels that she was having a panic attack.  Pt states that she was sleeping.  Pt states that she was feeling much better after being able to calm herself and drink water.   Pt was advised to follow symptoms and seek care as needed.

## 2014-07-14 NOTE — ED Notes (Signed)
Patient left per note left by staff. Pt. Name called without answer.

## 2014-08-12 ENCOUNTER — Encounter (HOSPITAL_COMMUNITY): Payer: Self-pay | Admitting: *Deleted

## 2014-08-12 ENCOUNTER — Emergency Department (HOSPITAL_COMMUNITY)
Admission: EM | Admit: 2014-08-12 | Discharge: 2014-08-12 | Disposition: A | Discharge status: Home / Self Care | Payer: Medicaid Other | Attending: Emergency Medicine | Admitting: Emergency Medicine

## 2014-08-12 DIAGNOSIS — Z880 Allergy status to penicillin: Secondary | ICD-10-CM | POA: Diagnosis not present

## 2014-08-12 DIAGNOSIS — F410 Panic disorder [episodic paroxysmal anxiety] without agoraphobia: Secondary | ICD-10-CM | POA: Diagnosis not present

## 2014-08-12 DIAGNOSIS — Z8619 Personal history of other infectious and parasitic diseases: Secondary | ICD-10-CM | POA: Insufficient documentation

## 2014-08-12 DIAGNOSIS — Z720 Tobacco use: Secondary | ICD-10-CM | POA: Insufficient documentation

## 2014-08-12 DIAGNOSIS — Z862 Personal history of diseases of the blood and blood-forming organs and certain disorders involving the immune mechanism: Secondary | ICD-10-CM | POA: Diagnosis not present

## 2014-08-12 DIAGNOSIS — R0602 Shortness of breath: Secondary | ICD-10-CM | POA: Diagnosis present

## 2014-08-12 HISTORY — DX: Anxiety disorder, unspecified: F41.9

## 2014-08-12 LAB — BASIC METABOLIC PANEL
Anion gap: 9 (ref 5–15)
BUN: 7 mg/dL (ref 6–20)
CO2: 22 mmol/L (ref 22–32)
Calcium: 9.6 mg/dL (ref 8.9–10.3)
Chloride: 106 mmol/L (ref 101–111)
Creatinine, Ser: 0.89 mg/dL (ref 0.44–1.00)
GFR calc Af Amer: >60 mL/min (ref >60)
Glucose, Bld: 93 mg/dL (ref 65–99)
POTASSIUM: 4.1 mmol/L (ref 3.5–5.1)
SODIUM: 137 mmol/L (ref 135–145)

## 2014-08-12 LAB — CBC
HCT: 43.1 % (ref 36.0–46.0)
HEMOGLOBIN: 15.5 g/dL — AB (ref 12.0–15.0)
MCH: 30.8 pg (ref 26.0–34.0)
MCHC: 36 g/dL (ref 30.0–36.0)
MCV: 85.5 fL (ref 78.0–100.0)
Platelets: 271 10*3/uL (ref 150–400)
RBC: 5.04 MIL/uL (ref 3.87–5.11)
RDW: 12.9 % (ref 11.5–15.5)
WBC: 4.6 10*3/uL (ref 4.0–10.5)

## 2014-08-12 LAB — I-STAT TROPONIN, ED: Troponin i, poc: 0 ng/mL (ref 0.00–0.08)

## 2014-08-12 MED ORDER — LORAZEPAM 1 MG PO TABS
1.0000 mg | ORAL_TABLET | Freq: Once | ORAL | Status: AC
  Administered 2014-08-12: 1 mg via ORAL
  Filled 2014-08-12: qty 1

## 2014-08-12 MED ORDER — LORAZEPAM 0.5 MG PO TABS: 0.5000 mg | ORAL_TABLET | Freq: Every day | ORAL | Status: DC

## 2014-08-12 NOTE — ED Notes (Signed)
Pt is in stable condition upon d/c and is escorted from ED via wheelchair. 

## 2014-08-12 NOTE — ED Notes (Signed)
Pt with hx of panic attacks states increased palpitations and sob when she lays down x 3 weeks.  States she called her pcp and they told her to come here b/c she has sickle cell TRAIT.

## 2014-08-12 NOTE — ED Provider Notes (Signed)
CSN: [id number]     Arrival date & time 08/12/14  0905 History   First MD Initiated Contact with Patient 08/12/14 [phone]     Chief Complaint  Patient presents with  . Shortness of Breath  . Panic Attack     (Consider location/radiation/quality/duration/timing/severity/associated sxs/prior Treatment) HPI Patient presents to the emergency department with increased anxiety and panic attacks over the last few weeks.  The patient states that it worse when she lays down and tries to get a bed at night she becomes anxious and will fall asleep and then jump up with increased anxiety.  Patient states that she used to be on Zoloft but no longer takes the medication.  She states that she has not followed up with the primary care doctor in quite a while.  The patient states that her GYN doctors attempting to arrange for follow-up for the patient.  The patient denies nausea, vomiting, abdominal pain, back pain, weakness, dizziness, headache, blurred vision, cough, fever, dysuria, hematuria, bloody stool or syncope.  The patient states that she has had this problem in the past.  Nothing seems make the patient's condition, better or worse Past Medical History  Diagnosis Date  . Sickle cell trait   . Trichomonas   . Depression     after loss of son  . Anxiety    Past Surgical History  Procedure Laterality Date  . Extraction wisdom     Family History  Problem Relation Age of Onset  . Other Neg Hx    History  Substance Use Topics  . Smoking status: Current Every Day Smoker -- 0.50 packs/day for 10 years    Types: Cigarettes  . Smokeless tobacco: Never Used  . Alcohol Use: 0.0 oz/week    0 Standard drinks or equivalent per week     Comment: Socially   OB History    Gravida Para Term Preterm AB TAB SAB Ectopic Multiple Living   2 2 2  0 0 0 0 0 0 1     Review of Systems  All other systems negative except as documented in the HPI. All pertinent positives and negatives as reviewed in the  HPI.  Allergies  Amoxicillin and Penicillins  Home Medications   Prior to Admission medications   Medication Sig Start Date End Date Taking? Authorizing Provider  etonogestrel (IMPLANON) 68 MG IMPL implant Inject 1 each into the skin once.   Yes Historical Provider, MD  Ibuprofen-Diphenhydramine Cit (ADVIL PM PO) Take 1 tablet by mouth at bedtime as needed (sleep).   Yes Historical Provider, MD   BP 124/76 mmHg  Pulse 84  Temp(Src) 99.5 F (37.5 C) (Oral)  Resp 27  Ht 5\' 6"  (1.676 m)  Wt 159 lb (72.122 kg)  BMI 25.68 kg/m2  SpO2 100% Physical Exam  Constitutional: She is oriented to person, place, and time. She appears well-developed and well-nourished. No distress.  HENT:  Head: Normocephalic and atraumatic.  Mouth/Throat: Oropharynx is clear and moist.  Eyes: Pupils are equal, round, and reactive to light.  Neck: Normal range of motion. Neck supple.  Cardiovascular: Normal rate, regular rhythm and normal heart sounds.  Exam reveals no gallop and no friction rub.   No murmur heard. Pulmonary/Chest: Effort normal and breath sounds normal. No respiratory distress.  Musculoskeletal: She exhibits no edema.  Neurological: She is alert and oriented to person, place, and time. She exhibits normal muscle tone. Coordination normal.  Skin: Skin is warm and dry. No rash noted. No erythema.  Nursing note and vitals reviewed.   ED Course  Procedures (including critical care time) Labs Review Labs Reviewed  CBC - Abnormal; Notable for the following:    Hemoglobin 15.5 (*)    All other components within normal limits  BASIC METABOLIC PANEL  I-STAT TROPOININ, ED     EKG Interpretation None       The patient will be advised to follow-up with the primary care doctor that she has been assigned.  The patient is advised to return here as needed.  Told to increase her fluid intake.       Charlestine Night, PA-C 08/12/14 1116  Benjiman Core, MD 08/13/14 [phone]

## 2014-08-12 NOTE — Discharge Instructions (Signed)
Return here as needed.  Follow-up with your primary care doctor °

## 2014-08-17 ENCOUNTER — Telehealth: Payer: Self-pay | Admitting: *Deleted

## 2014-08-17 NOTE — Telephone Encounter (Signed)
Patient is interested in having her Nexplanon removed and starting a new birth control. Patient states she believes she may want the Depo. Patient is also due for TOC. Patient has been scheduled for 08-18-14.

## 2014-08-18 ENCOUNTER — Ambulatory Visit: Payer: Self-pay | Admitting: Obstetrics

## 2014-08-21 ENCOUNTER — Ambulatory Visit: Payer: Self-pay | Admitting: Obstetrics

## 2014-08-30 ENCOUNTER — Telehealth: Payer: Self-pay | Admitting: *Deleted

## 2014-08-30 NOTE — Telephone Encounter (Signed)
Patient contacted the office and rescheduled Nexplanon removal and TOC appointment. Appointment scheduled for 09/01/14 @ 10:45 am.

## 2014-08-30 NOTE — Telephone Encounter (Signed)
Patient interested in rescheduling her appointment for the Nexplanon Removal and TOC.  Attempted to contact the patient and left message for patient to call the office.

## 2014-08-31 ENCOUNTER — Encounter (HOSPITAL_COMMUNITY): Payer: Self-pay | Admitting: Emergency Medicine

## 2014-08-31 ENCOUNTER — Emergency Department (HOSPITAL_COMMUNITY)
Admission: EM | Admit: 2014-08-31 | Discharge: 2014-08-31 | Disposition: A | Discharge status: Home / Self Care | Payer: Medicaid Other | Attending: Emergency Medicine | Admitting: Emergency Medicine

## 2014-08-31 DIAGNOSIS — Z720 Tobacco use: Secondary | ICD-10-CM | POA: Insufficient documentation

## 2014-08-31 DIAGNOSIS — L089 Local infection of the skin and subcutaneous tissue, unspecified: Secondary | ICD-10-CM | POA: Diagnosis present

## 2014-08-31 DIAGNOSIS — L923 Foreign body granuloma of the skin and subcutaneous tissue: Secondary | ICD-10-CM

## 2014-08-31 DIAGNOSIS — Z8619 Personal history of other infectious and parasitic diseases: Secondary | ICD-10-CM | POA: Diagnosis not present

## 2014-08-31 DIAGNOSIS — F419 Anxiety disorder, unspecified: Secondary | ICD-10-CM | POA: Insufficient documentation

## 2014-08-31 DIAGNOSIS — Z862 Personal history of diseases of the blood and blood-forming organs and certain disorders involving the immune mechanism: Secondary | ICD-10-CM | POA: Insufficient documentation

## 2014-08-31 DIAGNOSIS — Z880 Allergy status to penicillin: Secondary | ICD-10-CM | POA: Insufficient documentation

## 2014-08-31 MED ORDER — CETIRIZINE HCL 10 MG PO TABS: 10.0000 mg | ORAL_TABLET | Freq: Every day | ORAL | Status: DC

## 2014-08-31 MED ORDER — ACIDOPHILUS PROBIOTIC 10 MG PO TABS: 10.0000 mg | ORAL_TABLET | Freq: Three times a day (TID) | ORAL | Status: DC

## 2014-08-31 MED ORDER — NAPROXEN 250 MG PO TABS: 250.0000 mg | ORAL_TABLET | Freq: Two times a day (BID) | ORAL | Status: DC

## 2014-08-31 MED ORDER — CLINDAMYCIN HCL 150 MG PO CAPS: 300.0000 mg | ORAL_CAPSULE | Freq: Four times a day (QID) | ORAL | Status: DC

## 2014-08-31 NOTE — Discharge Instructions (Signed)
Tattoo Infection  · To reduce the risk of infection from body tattoos, make sure you understand the procedure, obtain the tattoo from a licensed commercial studio that uses sterile procedures and follow the after-care instructions about cleaning and maintenance.  · Wash your hands prior to working on your tattoo.  · Wash your tattoo area with warm soap and water twice a day. You may use antimicrobial soap.  · Repeat the washing until the tattoo appears dry or not weeping.  · Do not use harsh or rough material on the tattoo until it is healed.  · When finished washing, pat dry with a soft cloth. Do not rub dry.  · Allow the tattoo to air dry for at least 10 minutes.  · Apply Bacitracin®, Neosporin®, or triple antibiotic ointment lightly after each washing or as directed.  · Avoid sun exposure until the tattoo is healed, or cover the tattoo with loose clothing, or use Sunblock spf 45 applied frequently.  · Do not soak tattoo, while bathing or swimming, until it is healed. Avoid work out exercises that make you sweat heavily.  · During healing the tattoo may itch. Avoid scratching or picking at the tattoo. You may use over the counter antihistamines such as Benadryl® (diphenhydramine) in the dosage recommended on package. If this medicine makes you sleepy then take it at bedtime. Application of more lotion and applying it more frequently may also help relieve the itching.  · If your caregiver has prescribed antibiotics (a medication which kills germs), take your medicine as directed. Take it for the entire length of time it was prescribed, even if the wound appears to be doing well.  SEEK IMMEDIATE MEDICAL CARE IF:  · You have increased swelling around, or increased pain in the tattooed area.  · You have increasing inflammation (redness) around the tattoo.  · You have an increase in the amount of drainage coming from the tattoo.  · You have pus-like drainage from the tattoo.  · You have a fever.  Document Released:  10/26/2002 Document Revised: 04/21/2011 Document Reviewed: 01/17/2008  [employer]® Patient Information ©2015 [employer]. This information is not intended to replace advice given to you by your health care provider. Make sure you discuss any questions you have with your health care provider.

## 2014-08-31 NOTE — ED Notes (Signed)
Patient states got a tattoo a few days ago and now having swelling, drainage.   Patient states "I have others that just puffed up before, but now I have swelling, redness and pus draining".

## 2014-08-31 NOTE — ED Provider Notes (Signed)
CSN: [id number]     Arrival date & time 08/31/14  1059 History  This chart was scribed for non-physician practitioner, Everlene Farrier, PA-C, working with Donnetta Hutching, MD by Charline Bills, ED Scribe. This patient was seen in room TR08C/TR08C and the patient's care was started at 11:12 AM.   Chief Complaint  Patient presents with  . tattoo infection    The history is provided by the patient. No language interpreter was used.   HPI Comments: Brooke Lucas is a 29 y.o. female who presents to the Emergency Department complaining of a tattoo infection to left forearm for the past week. Pt states that she obtained a new tattoo 1 week ago at a licensed tattoo shop where she has gotten previous tattoos. She states that she typically experiences a reaction to her previous tattoos but swelling resolves with Neosporin after a few days. Pt reports persistent swelling, redness, weeping, and a burning/pruritic sensation to the affected area for the past week. She has applied Neosporin to the area without relief. Pt denies fever, chills, other rash. Pt's last tetanus was 2 years ago. Allergy to Amoxicillin and Penicillin.   Past Medical History  Diagnosis Date  . Sickle cell trait   . Trichomonas   . Depression     after loss of son  . Anxiety    Past Surgical History  Procedure Laterality Date  . Extraction wisdom     Family History  Problem Relation Age of Onset  . Other Neg Hx    History  Substance Use Topics  . Smoking status: Current Every Day Smoker -- 1.00 packs/day for 10 years    Types: Cigarettes  . Smokeless tobacco: Never Used  . Alcohol Use: 0.0 oz/week    0 Standard drinks or equivalent per week     Comment: Socially   OB History    Gravida Para Term Preterm AB TAB SAB Ectopic Multiple Living   0 0 0 0 0 0 1     Review of Systems  Constitutional: Negative for fever and chills.  Skin: Positive for color change and wound. Negative for pallor and rash.   Allergies   Amoxicillin; Penicillins; and Benadryl  Home Medications   Prior to Admission medications   Medication Sig Start Date End Date Taking? Authorizing Provider  cetirizine (ZYRTEC ALLERGY) 10 MG tablet Take 1 tablet (10 mg total) by mouth daily. 08/31/14   Everlene Farrier, PA-C  clindamycin (CLEOCIN) 150 MG capsule Take 2 capsules (300 mg total) by mouth 4 (four) times daily. May dispense as  capsules 08/31/14   Everlene Farrier, PA-C  etonogestrel (IMPLANON) 68 MG IMPL implant Inject 1 each into the skin once.    Historical Provider, MD  Ibuprofen-Diphenhydramine Cit (ADVIL PM PO) Take 1 tablet by mouth at bedtime as needed (sleep).    Historical Provider, MD  Lactobacillus (ACIDOPHILUS PROBIOTIC) 10 MG TABS Take 10 mg by mouth 3 (three) times daily. 08/31/14   Everlene Farrier, PA-C  LORazepam (ATIVAN) 0.5 MG tablet Take 1 tablet (0.5 mg total) by mouth at bedtime. 08/12/14   Charlestine Night, PA-C  naproxen (NAPROSYN) 250 MG tablet Take 1 tablet (250 mg total) by mouth 2 (two) times daily with a meal. 08/31/14   Everlene Farrier, PA-C   BP 127/77 mmHg  Pulse 70  Temp(Src) 97.5 F (36.4 C) (Oral)  Resp 12  SpO2 100% Physical Exam  Constitutional: She appears well-developed and well-nourished. No distress.  Nontoxic appearing.  HENT:  Head: Normocephalic and atraumatic.  Eyes: Right eye exhibits no discharge. Left eye exhibits no discharge.  Cardiovascular: Normal rate, regular rhythm and intact distal pulses.   Bilateral radial pulses are intact.  Pulmonary/Chest: Effort normal. No respiratory distress.  Neurological: She is alert. Coordination normal.  Skin: Skin is warm and dry. She is not diaphoretic. There is erythema. No pallor.  Left forearm: Overlying the tattoo is inflammation and minimal amount of yellowish discharge. No abscess. Surrounding erythema and edema to L forearm. No streaking erythema up her left arm.   Psychiatric: She has a normal mood and affect. Her behavior is  normal.  Nursing note and vitals reviewed.  ED Course  Procedures (including critical care time) DIAGNOSTIC STUDIES: Oxygen Saturation is 100% on RA, normal by my interpretation.    COORDINATION OF CARE: 11:18 AM-Discussed treatment plan which includes Clindamycin and Naproxen with pt at bedside and pt agreed to plan.   Labs Review Labs Reviewed - No data to display  Imaging Review No results found.   EKG Interpretation None      Filed Vitals:   08/31/14 1105 08/31/14 1110  BP: 127/77   Pulse: 70   Temp: 97.5 F (36.4 C)   TempSrc: Oral   Resp: 12   Height:  5\' 6"  (1.676 m)  Weight:  159 lb (72.122 kg)  SpO2: 100%      MDM   Meds given in ED:  Medications - No data to display  Discharge Medication List as of 08/31/2014 11:25 AM    START taking these medications   Details  cetirizine (ZYRTEC ALLERGY) 10 MG tablet Take 1 tablet (10 mg total) by mouth daily., Starting 08/31/2014, Until Discontinued, Print    clindamycin (CLEOCIN) 150 MG capsule Take 2 capsules (300 mg total) by mouth 4 (four) times daily. May dispense as 150mg  capsules, Starting 08/31/2014, Until Discontinued, Print    Lactobacillus (ACIDOPHILUS PROBIOTIC) 10 MG TABS Take 10 mg by mouth 3 (three) times daily., Starting 08/31/2014, Until Discontinued, Print    naproxen (NAPROSYN) 250 MG tablet Take 1 tablet (250 mg total) by mouth 2 (two) times daily with a meal., Starting 08/31/2014, Until Discontinued, Print        Final diagnoses:  Tattoo reaction   This is a 29 y.o. female who presents to the Emergency Department complaining of a tattoo infection to left forearm for the past week. Pt states that she obtained a new tattoo 1 week ago at a licensed tattoo shop where she has gotten previous tattoos. She states that she typically experiences a reaction to her previous tattoos but swelling resolves with Neosporin after a few days. Pt reports persistent swelling, redness, weeping, and a burning/pruritic  sensation to the affected area for the past week. On exam the patient is afebrile and nontoxic appearing. The patient does have surrounding erythema and edema to her tattoo on her left forearm. There is small amount of yellowish discharge overlying the tattoo. No evidence of abscess. No induration appreciated. Question whether this is a reaction to her tattoo ink. Patient reports her tetanus shot was last updated 2 years ago. We'll discharge with prescriptions for clindamycin, probiotic, Naprosyn and Zyrtec for itching. Patient reports Benadryl does not work well for her. Wound care education given and return precautions. I advised the patient to follow-up with their primary care provider this week. I advised the patient to return to the emergency department with new or worsening symptoms or new concerns. The patient verbalized understanding and  agreement with plan.    I personally performed the services described in this documentation, which was scribed in my presence. The recorded information has been reviewed and is accurate.    Everlene Farrier, PA-C 08/31/14 1137  Donnetta Hutching, MD 09/01/14 1345

## 2014-08-31 NOTE — ED Notes (Signed)
Pt walked out of ER right after being registered and got into her car and drove off.

## 2014-09-01 ENCOUNTER — Ambulatory Visit: Payer: Self-pay | Admitting: Obstetrics

## 2014-10-05 ENCOUNTER — Ambulatory Visit (INDEPENDENT_AMBULATORY_CARE_PROVIDER_SITE_OTHER): Payer: Medicaid Other | Admitting: Obstetrics

## 2014-10-05 ENCOUNTER — Encounter: Payer: Self-pay | Admitting: Obstetrics

## 2014-10-05 VITALS — BP 115/75 | HR 91 | Temp 97.9°F | Ht 67.0 in | Wt 162.2 lb

## 2014-10-05 DIAGNOSIS — Z3049 Encounter for surveillance of other contraceptives: Secondary | ICD-10-CM | POA: Diagnosis not present

## 2014-10-05 DIAGNOSIS — Z308 Encounter for other contraceptive management: Secondary | ICD-10-CM | POA: Diagnosis not present

## 2014-10-05 DIAGNOSIS — Z3046 Encounter for surveillance of implantable subdermal contraceptive: Secondary | ICD-10-CM

## 2014-10-05 DIAGNOSIS — B369 Superficial mycosis, unspecified: Secondary | ICD-10-CM

## 2014-10-05 MED ORDER — CLOTRIMAZOLE-BETAMETHASONE 1-0.05 % EX CREA: 1.0000 "application " | TOPICAL_CREAM | Freq: Two times a day (BID) | CUTANEOUS | Status: DC

## 2014-10-05 MED ORDER — CLOTRIMAZOLE 1 % EX CREA: 1.0000 "application " | TOPICAL_CREAM | Freq: Two times a day (BID) | CUTANEOUS | Status: DC

## 2014-10-05 NOTE — Addendum Note (Signed)
Addended by: Coral Ceo A on: 10/05/2014 05:42 PM   Modules accepted: Orders, Medications

## 2014-10-05 NOTE — Progress Notes (Signed)

## 2014-12-21 ENCOUNTER — Ambulatory Visit: Payer: Medicaid Other | Admitting: Obstetrics

## 2014-12-25 ENCOUNTER — Encounter: Payer: Self-pay | Admitting: Obstetrics

## 2014-12-25 ENCOUNTER — Ambulatory Visit (INDEPENDENT_AMBULATORY_CARE_PROVIDER_SITE_OTHER): Payer: Medicaid Other | Admitting: Obstetrics

## 2014-12-25 VITALS — BP 99/65 | HR 75 | Temp 98.2°F | Ht 67.0 in | Wt 160.0 lb

## 2014-12-25 DIAGNOSIS — Z23 Encounter for immunization: Secondary | ICD-10-CM

## 2014-12-25 DIAGNOSIS — L738 Other specified follicular disorders: Secondary | ICD-10-CM

## 2014-12-25 DIAGNOSIS — Z3202 Encounter for pregnancy test, result negative: Secondary | ICD-10-CM | POA: Diagnosis not present

## 2014-12-25 DIAGNOSIS — B373 Candidiasis of vulva and vagina: Secondary | ICD-10-CM | POA: Diagnosis not present

## 2014-12-25 DIAGNOSIS — Z30013 Encounter for initial prescription of injectable contraceptive: Secondary | ICD-10-CM | POA: Diagnosis not present

## 2014-12-25 DIAGNOSIS — B3731 Acute candidiasis of vulva and vagina: Secondary | ICD-10-CM

## 2014-12-25 DIAGNOSIS — B350 Tinea barbae and tinea capitis: Secondary | ICD-10-CM

## 2014-12-25 DIAGNOSIS — Z0189 Encounter for other specified special examinations: Secondary | ICD-10-CM

## 2014-12-25 LAB — POCT URINE PREGNANCY: Preg Test, Ur: NEGATIVE

## 2014-12-25 MED ORDER — CLINDAMYCIN HCL 300 MG PO CAPS: 300.0000 mg | ORAL_CAPSULE | Freq: Three times a day (TID) | ORAL | Status: DC

## 2014-12-25 MED ORDER — FLUCONAZOLE 150 MG PO TABS: 150.0000 mg | ORAL_TABLET | Freq: Once | ORAL | Status: DC

## 2014-12-25 MED ORDER — MEDROXYPROGESTERONE ACETATE 150 MG/ML IM SUSP: 150.0000 mg | INTRAMUSCULAR | Status: DC

## 2014-12-25 NOTE — Progress Notes (Signed)
Patient ID: Brooke Lucas, female   DOB: [birth year], 29 y.o.   MRN: [id number]  Chief Complaint  Patient presents with  . Contraception    interested in Depo    HPI Brooke Lucas is a 29 y.o. female.  Hair bumps after shaving.  Wants to start a contraceptive method.  HPI  Past Medical History  Diagnosis Date  . Sickle cell trait (HCC)   . Trichomonas   . Depression     after loss of son  . Anxiety     Past Surgical History  Procedure Laterality Date  . Extraction wisdom      Family History  Problem Relation Age of Onset  . Other Neg Hx     Social History Social History  Substance Use Topics  . Smoking status: Current Every Day Smoker -- 1.00 packs/day for 10 years    Types: Cigarettes  . Smokeless tobacco: Never Used  . Alcohol Use: 0.0 oz/week    0 Standard drinks or equivalent per week     Comment: Socially    Allergies  Allergen Reactions  . Amoxicillin Anaphylaxis  . Penicillins Anaphylaxis  . Benadryl [Diphenhydramine]     Anxiety     Current Outpatient Prescriptions  Medication Sig Dispense Refill  . clindamycin (CLEOCIN) 300 MG capsule Take 1 capsule (300 mg total) by mouth 3 (three) times daily. 30 capsule 2  . fluconazole (DIFLUCAN) 150 MG tablet Take 1 tablet (150 mg total) by mouth once. 1 tablet 2  . medroxyPROGESTERone (DEPO-PROVERA) 150 MG/ML injection Inject 1 mL (150 mg total) into the muscle every 3 (three) months. 1 mL 3   No current facility-administered medications for this visit.    Review of Systems Review of Systems Constitutional: negative for fatigue and weight loss Respiratory: negative for cough and wheezing Cardiovascular: negative for chest pain, fatigue and palpitations Gastrointestinal: negative for abdominal pain and change in bowel habits Genitourinary: positive for hair bumps Integument/breast: negative for nipple discharge Musculoskeletal:negative for myalgias Neurological: negative for gait problems and  tremors Behavioral/Psych: negative for abusive relationship, depression Endocrine: negative for temperature intolerance     Blood pressure 99/65, pulse 75, temperature 98.2 F (36.8 C), height  (1.702 m), weight 160 lb (72.576 kg), last menstrual period 12/11/2014.  Physical Exam Physical Exam            General:  Alert and no distress Abdomen:  normal findings: no organomegaly, soft, non-tender and no hernia  Pelvis:  External genitalia: normal general appearance Urinary system: urethral meatus normal and bladder without fullness, nontender Vaginal: normal without tenderness, induration or masses Cervix: normal appearance Adnexa: normal bimanual exam Uterus: anteverted and non-tender, normal size     Data Reviewed Labs  Assessment     Folliculitis / Abscess Contraception counseling and advice    Plan    Clindamycin Rx Depo Provera Rx F/U prn  Orders Placed This Encounter  Procedures  . SureSwab, Vaginosis/Vaginitis Plus  . POCT urine pregnancy   Meds ordered this encounter  Medications  . clindamycin (CLEOCIN) 300 MG capsule    Sig: Take 1 capsule (300 mg total) by mouth 3 (three) times daily.    Dispense:  30 capsule    Refill:  2  . medroxyPROGESTERone (DEPO-PROVERA) 150 MG/ML injection    Sig: Inject 1 mL (150 mg total) into the muscle every 3 (three) months.    Dispense:  1 mL    Refill:  3  . fluconazole (DIFLUCAN) 150 MG  tablet    Sig: Take 1 tablet (150 mg total) by mouth once.    Dispense:  1 tablet    Refill:  2

## 2014-12-28 ENCOUNTER — Other Ambulatory Visit: Payer: Self-pay | Admitting: Obstetrics

## 2014-12-28 LAB — SURESWAB, VAGINOSIS/VAGINITIS PLUS
ATOPOBIUM VAGINAE: 6.2 Log (cells/mL)
BV CATEGORY: UNDETERMINED — AB
C. TRACHOMATIS RNA, TMA: NOT DETECTED
C. albicans, DNA: NOT DETECTED
C. glabrata, DNA: NOT DETECTED
C. parapsilosis, DNA: NOT DETECTED
C. tropicalis, DNA: NOT DETECTED
LACTOBACILLUS SPECIES: DETECTED Log (cells/mL)
MEGASPHAERA SPECIES: 7.9 Log (cells/mL)
N. GONORRHOEAE RNA, TMA: NOT DETECTED
T. vaginalis RNA, QL TMA: NOT DETECTED

## 2015-01-09 ENCOUNTER — Ambulatory Visit (INDEPENDENT_AMBULATORY_CARE_PROVIDER_SITE_OTHER): Payer: Medicaid Other | Admitting: *Deleted

## 2015-01-09 ENCOUNTER — Ambulatory Visit (INDEPENDENT_AMBULATORY_CARE_PROVIDER_SITE_OTHER): Payer: Medicaid Other | Admitting: Obstetrics

## 2015-01-09 VITALS — BP 105/66 | HR 76 | Temp 98.5°F | Ht 67.0 in | Wt 162.0 lb

## 2015-01-09 DIAGNOSIS — Z3042 Encounter for surveillance of injectable contraceptive: Secondary | ICD-10-CM

## 2015-01-09 DIAGNOSIS — Z3202 Encounter for pregnancy test, result negative: Secondary | ICD-10-CM

## 2015-01-09 DIAGNOSIS — Z30013 Encounter for initial prescription of injectable contraceptive: Secondary | ICD-10-CM

## 2015-01-09 LAB — POCT URINE PREGNANCY: PREG TEST UR: NEGATIVE

## 2015-01-09 MED ORDER — MEDROXYPROGESTERONE ACETATE 150 MG/ML IM SUSP
150.0000 mg | INTRAMUSCULAR | Status: AC
  Administered 2015-01-09: 150 mg via INTRAMUSCULAR

## 2015-01-09 NOTE — Progress Notes (Addendum)
Patient in office today for a pregnancy test and to restart her Depo. Patient states she has not been sexually active since her last visit. Pregnancy test in office is negative.  Patient tolerated injection well.  Patient due for Depo on 04-02-15.  BP 105/66 mmHg  Pulse 76  Temp(Src) 98.5 F (36.9 C)  Ht 5\' 7"  (1.702 m)  Wt 162 lb (73.483 kg)  BMI 25.37 kg/m2  LMP 01/01/2015  Administrations This Visit    medroxyPROGESTERone (DEPO-PROVERA) injection 150 mg    Admin Date Action Dose Route Administered By         01/09/2015 Given 150 mg Intramuscular Henriette CombsAndrea L Katheryn Culliton, LPN

## 2015-01-12 NOTE — Progress Notes (Signed)
Patient not seen by physician.  For Depo Provera injection only.

## 2015-02-11 ENCOUNTER — Emergency Department (HOSPITAL_COMMUNITY)
Admission: EM | Admit: 2015-02-11 | Discharge: 2015-02-11 | Disposition: A | Discharge status: Home / Self Care | Payer: Medicaid Other | Attending: Emergency Medicine | Admitting: Emergency Medicine

## 2015-02-11 ENCOUNTER — Encounter (HOSPITAL_COMMUNITY): Payer: Self-pay | Admitting: *Deleted

## 2015-02-11 DIAGNOSIS — Z880 Allergy status to penicillin: Secondary | ICD-10-CM | POA: Insufficient documentation

## 2015-02-11 DIAGNOSIS — Z8659 Personal history of other mental and behavioral disorders: Secondary | ICD-10-CM | POA: Insufficient documentation

## 2015-02-11 DIAGNOSIS — Z8619 Personal history of other infectious and parasitic diseases: Secondary | ICD-10-CM | POA: Insufficient documentation

## 2015-02-11 DIAGNOSIS — Z793 Long term (current) use of hormonal contraceptives: Secondary | ICD-10-CM | POA: Insufficient documentation

## 2015-02-11 DIAGNOSIS — J069 Acute upper respiratory infection, unspecified: Secondary | ICD-10-CM

## 2015-02-11 DIAGNOSIS — F17210 Nicotine dependence, cigarettes, uncomplicated: Secondary | ICD-10-CM | POA: Insufficient documentation

## 2015-02-11 DIAGNOSIS — Z862 Personal history of diseases of the blood and blood-forming organs and certain disorders involving the immune mechanism: Secondary | ICD-10-CM | POA: Diagnosis not present

## 2015-02-11 DIAGNOSIS — R05 Cough: Secondary | ICD-10-CM | POA: Diagnosis present

## 2015-02-11 MED ORDER — BENZONATATE 100 MG PO CAPS: 100.0000 mg | ORAL_CAPSULE | Freq: Every evening | ORAL | Status: DC | PRN

## 2015-02-11 MED ORDER — FLUTICASONE PROPIONATE 50 MCG/ACT NA SUSP: 2.0000 | Freq: Every day | NASAL | Status: DC

## 2015-02-11 NOTE — ED Provider Notes (Signed)
CSN: [id number]     Arrival date & time 02/11/15  4098 History  By signing my name below, I, Elon Spanner, attest that this documentation has been prepared under the direction and in the presence of [facility], PA-C. Electronically Signed: Elon Spanner ED Scribe. 02/11/2015. 9:53 AM.    Chief Complaint  Patient presents with  . Cough   The history is provided by the patient. No language interpreter was used.    HPI Comments: Brooke LICKLIDER is a 30 y.o. female who presents to the Emergency Department complaining of a worsening productive cough onset 5 days ago.  Associated symptoms include mild wheezing, mild SOB, and congestion that has been improved by Mucinex. The patient reports a possible sick contact with young relative.  She denies vomiting, fever.  Past Medical History  Diagnosis Date  . Sickle cell trait (HCC)   . Trichomonas   . Depression     after loss of son  . Anxiety    Past Surgical History  Procedure Laterality Date  . Extraction wisdom     Family History  Problem Relation Age of Onset  . Other Neg Hx    Social History  Substance Use Topics  . Smoking status: Current Every Day Smoker -- 1.00 packs/day for 10 years    Types: Cigarettes  . Smokeless tobacco: Never Used  . Alcohol Use: 0.0 oz/week    0 Standard drinks or equivalent per week     Comment: Socially   OB History    Gravida Para Term Preterm AB TAB SAB Ectopic Multiple Living   2 2 2  0 0 0 0 0 0 1     Review of Systems 10 Systems reviewed and all are negative for acute change except as noted in the HPI.   Allergies  Amoxicillin; Penicillins; and Benadryl  Home Medications   Prior to Admission medications   Medication Sig Start Date End Date Taking? Authorizing Provider  benzonatate (TESSALON) 100 MG capsule Take 1 capsule (100 mg total) by mouth at bedtime as needed for cough. 02/11/15   Jaime Pilcher Ward, PA-C  fluticasone (FLONASE) 50 MCG/ACT nasal spray Place 2 sprays into both  nostrils daily. 02/11/15   Chase Picket Ward, PA-C  medroxyPROGESTERone (DEPO-PROVERA) 150 MG/ML injection Inject 1 mL (150 mg total) into the muscle every 3 (three) months. 12/25/14   Brock Bad, MD   BP 127/59 mmHg  Pulse 81  Temp(Src) 98.7 F (37.1 C) (Oral)  Resp 16  Ht 5\' 7"  (1.702 m)  Wt 73.936 kg  BMI 25.52 kg/m2  SpO2 100%  LMP 02/10/2015 Physical Exam  Constitutional: She is oriented to person, place, and time. She appears well-developed and well-nourished. No distress.  HENT:  Head: Normocephalic and atraumatic.  Right Ear: Tympanic membrane, external ear and ear canal normal.  Left Ear: Tympanic membrane, external ear and ear canal normal.  Nose: Right sinus exhibits no maxillary sinus tenderness and no frontal sinus tenderness. Left sinus exhibits no maxillary sinus tenderness and no frontal sinus tenderness.  OP with erythema, no exudates +PND  Eyes: Conjunctivae are normal.  Neck: Neck supple. No tracheal deviation present.  Cardiovascular: Normal rate, regular rhythm and normal heart sounds.  Exam reveals no gallop and no friction rub.   No murmur heard. Pulmonary/Chest: Effort normal and breath sounds normal. No stridor. No respiratory distress. She has no wheezes. She has no rales.  Musculoskeletal: Normal range of motion.  Lymphadenopathy:    She has  no cervical adenopathy.  Neurological: She is alert and oriented to person, place, and time.  Skin: Skin is warm and dry.  Psychiatric: She has a normal mood and affect. Her behavior is normal.  Nursing note and vitals reviewed.   ED Course  Procedures (including critical care time)  DIAGNOSTIC STUDIES: Oxygen Saturation is 100% on RA, normal by my interpretation.    COORDINATION OF CARE:  9:56 AM Discussed treatment plan with patient at bedside.  Patient acknowledges and agrees with plan.    Labs Review Labs Reviewed - No data to display  Imaging Review No results found. I have personally  reviewed and evaluated these images and lab results as part of my medical decision-making.   EKG Interpretation None      MDM   Final diagnoses:  URI, acute    Brooke Lucas presents with cough/congestion c/w viral URI. No fever, lungs clear. Will dc to home in good and stable condition with tessalon, flonase, and symptomatic care instructions. Return precautions given.   I personally performed the services described in this documentation, which was scribed in my presence. The recorded information has been reviewed and is accurate.  [facility] And Medical CenterJaime Pilcher Ward, PA-C 02/11/15 1442  Raeford RazorStephen Kohut, MD 02/12/15 [phone]

## 2015-02-11 NOTE — Discharge Instructions (Signed)
1. Medications: flonase, mucinex, tessalon as needed at night for cough, continue usual home medications 2. Treatment: rest, drink plenty of fluids, take tylenol or ibuprofen for fever control as needed 3. Follow Up: Please follow up with your primary doctor in 3 days for discussion of your diagnoses and further evaluation after today's visit; Return to the ER for high fevers, difficulty breathing or other concerning symptoms

## 2015-02-11 NOTE — ED Notes (Signed)
Declined W/C at D/C and was escorted to lobby by RN. 

## 2015-02-11 NOTE — ED Notes (Signed)
Pt reports cough started 1 week ago. Pt reports the cough is productive.

## 2015-03-02 ENCOUNTER — Ambulatory Visit: Payer: Self-pay | Admitting: Certified Nurse Midwife

## 2015-03-07 ENCOUNTER — Encounter: Payer: Self-pay | Admitting: Certified Nurse Midwife

## 2015-03-07 ENCOUNTER — Ambulatory Visit (INDEPENDENT_AMBULATORY_CARE_PROVIDER_SITE_OTHER): Payer: Medicaid Other | Admitting: Certified Nurse Midwife

## 2015-03-07 VITALS — BP 107/72 | HR 67 | Temp 98.6°F | Wt 164.0 lb

## 2015-03-07 DIAGNOSIS — Z716 Tobacco abuse counseling: Secondary | ICD-10-CM

## 2015-03-07 DIAGNOSIS — N926 Irregular menstruation, unspecified: Secondary | ICD-10-CM

## 2015-03-07 DIAGNOSIS — Z720 Tobacco use: Secondary | ICD-10-CM

## 2015-03-07 MED ORDER — BUPROPION HCL ER (SR) 150 MG PO TB12: 150.0000 mg | ORAL_TABLET | Freq: Two times a day (BID) | ORAL | Status: DC

## 2015-03-07 MED ORDER — ACETAMINOPHEN-CAFF-PYRILAMINE 500-60-15 MG PO TABS: 2.0000 | ORAL_TABLET | Freq: Four times a day (QID) | ORAL | Status: DC | PRN

## 2015-03-07 NOTE — Addendum Note (Signed)
Addended by: Marya Landry D on: 03/07/2015 12:08 PM   Modules accepted: Orders

## 2015-03-07 NOTE — Progress Notes (Signed)
Patient ID: Brooke Lucas, female   DOB: 10-31-1985, 30 y.o.   MRN: [id number]  Chief Complaint  Patient presents with  . Follow-up    cultures and discuss Nexplanon, bleeding with depo    HPI Brooke Lucas is a 30 y.o. female.  Had her first depo injection and has been having irregular bleeding since, with spotting/period like bleeding off an on.  She desires for it to stop, did not have this problem with Nexplanon.  Had been on depo as a teen and did not have irregular bleeding.  Does have hx of STIs.  Cultures done today.  Discussed smoking cessation, she tried Chantix but was having worse symptoms with depression so she stopped.  Has not quit smoking.  Discussed trial of Wellbutrin.  She stated that her family is going to counseling and reports improvement in mood/thoughts.  Did have side effect from Nexplanon of hair loss and breakage.  Discussed IUD at length.    HPI  Past Medical History  Diagnosis Date  . Sickle cell trait (HCC)   . Trichomonas   . Depression     after loss of son  . Anxiety     Past Surgical History  Procedure Laterality Date  . Extraction wisdom      Family History  Problem Relation Age of Onset  . Other Neg Hx     Social History Social History  Substance Use Topics  . Smoking status: Current Every Day Smoker -- 1.00 packs/day for 10 years    Types: Cigarettes  . Smokeless tobacco: Never Used  . Alcohol Use: 0.0 oz/week    0 Standard drinks or equivalent per week     Comment: Socially    Allergies  Allergen Reactions  . Amoxicillin Anaphylaxis  . Penicillins Anaphylaxis  . Benadryl [Diphenhydramine]     Anxiety     Current Outpatient Prescriptions  Medication Sig Dispense Refill  . medroxyPROGESTERone (DEPO-PROVERA) 150 MG/ML injection Inject 1 mL (150 mg total) into the muscle every 3 (three) months. 1 mL 3  . Acetaminophen-Caff-Pyrilamine (MIDOL MAX ST MENSTRUAL FORMULA) 500-60-15 MG TABS Take 2 tablets by mouth every 6 (six)  hours as needed. 90 each 3  . benzonatate (TESSALON) 100 MG capsule Take 1 capsule (100 mg total) by mouth at bedtime as needed for cough. 14 capsule 0  . buPROPion (WELLBUTRIN SR) 150 MG 12 hr tablet Take 1 tablet (150 mg total) by mouth 2 (two) times daily. 60 tablet 2  . fluticasone (FLONASE) 50 MCG/ACT nasal spray Place 2 sprays into both nostrils daily. 16 g 2   Current Facility-Administered Medications  Medication Dose Route Frequency Provider Last Rate Last Dose  . medroxyPROGESTERone (DEPO-PROVERA) injection 150 mg  150 mg Intramuscular Q90 days Brock Bad, MD   150 mg at 01/09/15 1537    Review of Systems Review of Systems Constitutional: negative for fatigue and weight loss Respiratory: negative for cough and wheezing Cardiovascular: negative for chest pain, fatigue and palpitations Gastrointestinal: negative for abdominal pain and change in bowel habits Genitourinary:+ irregular bleeding with Depo. Integument/breast: negative for nipple discharge Musculoskeletal:negative for myalgias Neurological: negative for gait problems and tremors Behavioral/Psych: negative for abusive relationship, depression Endocrine: negative for temperature intolerance     Blood pressure 107/72, pulse 67, temperature 98.6 F (37 C), weight 164 lb (74.39 kg), last menstrual period 02/10/2015.  Physical Exam Physical Exam General:   alert  Skin:   no rash or abnormalities  Lungs:   clear  to auscultation bilaterally  Heart:   regular rate and rhythm, S1, S2 normal, no murmur, click, rub or gallop  Breasts:   normal without suspicious masses, skin or nipple changes or axillary nodes  Abdomen:  normal findings: no organomegaly, soft, non-tender and no hernia  Pelvis:  External genitalia: normal general appearance Urinary system: urethral meatus normal and bladder without fullness, nontender Vaginal: normal without tenderness, induration or masses Cervix: no CMT Adnexa: normal bimanual  exam Uterus: anteverted and non-tender, normal size    50% of 15 min visit spent on counseling and coordination of care.   Data Reviewed Previous medical hx, meds  Assessment     Irregular bleeding with Depo.  Contraception counseling/management  Smoking cessation counseling    Plan   Information given for [employer] quit line.   No orders of the defined types were placed in this encounter.   Meds ordered this encounter  Medications  . buPROPion (WELLBUTRIN SR) 150 MG 12 hr tablet    Sig: Take 1 tablet (150 mg total) by mouth 2 (two) times daily.    Dispense:  60 tablet    Refill:  2  . Acetaminophen-Caff-Pyrilamine (MIDOL MAX ST MENSTRUAL FORMULA) 500-60-15 MG TABS    Sig: Take 2 tablets by mouth every 6 (six) hours as needed.    Dispense:  90 each    Refill:  3   Possible management options include: Mirena IUD ASAP.  Follow up with IUD

## 2015-03-10 LAB — SURESWAB, VAGINOSIS/VAGINITIS PLUS
Atopobium vaginae: 6.9 Log (cells/mL)
BV CATEGORY: UNDETERMINED — AB
C. TROPICALIS, DNA: NOT DETECTED
C. albicans, DNA: NOT DETECTED
C. glabrata, DNA: NOT DETECTED
C. parapsilosis, DNA: NOT DETECTED
C. trachomatis RNA, TMA: NOT DETECTED
LACTOBACILLUS SPECIES: 5.1 Log (cells/mL)
MEGASPHAERA SPECIES: >8 Log (cells/mL)
N. gonorrhoeae RNA, TMA: NOT DETECTED
T. VAGINALIS RNA, QL TMA: NOT DETECTED

## 2015-03-11 ENCOUNTER — Other Ambulatory Visit: Payer: Self-pay | Admitting: Certified Nurse Midwife

## 2015-03-11 DIAGNOSIS — N760 Acute vaginitis: Principal | ICD-10-CM

## 2015-03-11 DIAGNOSIS — B9689 Other specified bacterial agents as the cause of diseases classified elsewhere: Secondary | ICD-10-CM

## 2015-03-11 MED ORDER — METRONIDAZOLE 500 MG PO TABS: 500.0000 mg | ORAL_TABLET | Freq: Two times a day (BID) | ORAL | Status: DC

## 2015-04-02 ENCOUNTER — Ambulatory Visit: Payer: Medicaid Other

## 2015-04-06 ENCOUNTER — Telehealth: Payer: Self-pay | Admitting: *Deleted

## 2015-04-06 ENCOUNTER — Other Ambulatory Visit: Payer: Self-pay | Admitting: Certified Nurse Midwife

## 2015-04-06 DIAGNOSIS — Z3042 Encounter for surveillance of injectable contraceptive: Secondary | ICD-10-CM

## 2015-04-06 MED ORDER — MISOPROSTOL 200 MCG PO TABS: ORAL_TABLET | ORAL | Status: DC

## 2015-04-06 MED ORDER — CYCLOBENZAPRINE HCL 10 MG PO TABS: 10.0000 mg | ORAL_TABLET | Freq: Three times a day (TID) | ORAL | Status: DC | PRN

## 2015-04-06 MED ORDER — IBUPROFEN 800 MG PO TABS: 800.0000 mg | ORAL_TABLET | Freq: Three times a day (TID) | ORAL | Status: DC | PRN

## 2015-04-06 NOTE — Telephone Encounter (Signed)
Patient contacted the office to schedule her IUD insertion. Patient is currently on the Depo injection. Her current injection runs our on April 10, 2015. Patient has been scheduled for her IUD insertion on 04-11-15. Patient is not currently sexually active. Patient advised to continue to abstain from intercourse until after insertion of device. Orvilla Cornwall, CNM sent in prescriptions for Cyt[employer], Ibuprofen and Flexeril for patient to use prior to her IUD insertion. Patient given instructions and verbalized understanding,

## 2015-04-11 ENCOUNTER — Ambulatory Visit: Payer: Self-pay | Admitting: Certified Nurse Midwife

## 2015-04-12 ENCOUNTER — Ambulatory Visit: Payer: Self-pay | Admitting: Certified Nurse Midwife

## 2015-06-13 ENCOUNTER — Ambulatory Visit: Payer: Medicaid Other | Admitting: Certified Nurse Midwife

## 2015-06-29 ENCOUNTER — Ambulatory Visit (INDEPENDENT_AMBULATORY_CARE_PROVIDER_SITE_OTHER): Payer: Medicaid Other | Admitting: Obstetrics

## 2015-06-29 ENCOUNTER — Encounter: Payer: Self-pay | Admitting: Obstetrics

## 2015-06-29 VITALS — BP 123/71 | HR 83 | Temp 98.7°F | Wt 167.0 lb

## 2015-06-29 DIAGNOSIS — A499 Bacterial infection, unspecified: Secondary | ICD-10-CM | POA: Diagnosis not present

## 2015-06-29 DIAGNOSIS — B373 Candidiasis of vulva and vagina: Secondary | ICD-10-CM | POA: Diagnosis not present

## 2015-06-29 DIAGNOSIS — N912 Amenorrhea, unspecified: Secondary | ICD-10-CM

## 2015-06-29 DIAGNOSIS — B9689 Other specified bacterial agents as the cause of diseases classified elsewhere: Secondary | ICD-10-CM

## 2015-06-29 DIAGNOSIS — N760 Acute vaginitis: Secondary | ICD-10-CM | POA: Diagnosis not present

## 2015-06-29 DIAGNOSIS — Z3202 Encounter for pregnancy test, result negative: Secondary | ICD-10-CM | POA: Diagnosis not present

## 2015-06-29 DIAGNOSIS — B3731 Acute candidiasis of vulva and vagina: Secondary | ICD-10-CM

## 2015-06-29 LAB — POCT URINALYSIS DIPSTICK
Bilirubin, UA: NEGATIVE
Glucose, UA: NEGATIVE
KETONES UA: NEGATIVE
Leukocytes, UA: NEGATIVE
Nitrite, UA: NEGATIVE
PH UA: 6
PROTEIN UA: NEGATIVE
RBC UA: NEGATIVE
SPEC GRAV UA: 1.02
UROBILINOGEN UA: NEGATIVE

## 2015-06-29 LAB — POCT URINE PREGNANCY: PREG TEST UR: NEGATIVE

## 2015-06-29 MED ORDER — METRONIDAZOLE 500 MG PO TABS: 500.0000 mg | ORAL_TABLET | Freq: Two times a day (BID) | ORAL | Status: DC

## 2015-06-29 MED ORDER — FLUCONAZOLE 200 MG PO TABS: ORAL_TABLET | ORAL | Status: DC

## 2015-06-29 NOTE — Progress Notes (Signed)
Patient ID: Brooke Lucas, female   DOB: [birth year], 30 y.o.   MRN: [id number]  Chief Complaint  Patient presents with  . STD testing  . pregnancy test    HPI Brooke Lucas is a 30 y.o. female.  Possible STD exposure.  Requests STD screening.  HPI  Past Medical History  Diagnosis Date  . Sickle cell trait (HCC)   . Trichomonas   . Depression     after loss of son  . Anxiety     Past Surgical History  Procedure Laterality Date  . Extraction wisdom      Family History  Problem Relation Age of Onset  . Other Neg Hx     Social History Social History  Substance Use Topics  . Smoking status: Current Every Day Smoker -- 1.00 packs/day for 10 years    Types: Cigarettes  . Smokeless tobacco: Never Used  . Alcohol Use: 0.0 oz/week    0 Standard drinks or equivalent per week     Comment: Socially    Allergies  Allergen Reactions  . Amoxicillin Anaphylaxis  . Penicillins Anaphylaxis  . Benadryl [Diphenhydramine]     Anxiety     Current Outpatient Prescriptions  Medication Sig Dispense Refill  . fluconazole (DIFLUCAN) 200 MG tablet Take 1 tablet every other day. 3 tablet 2  . metroNIDAZOLE (FLAGYL) 500 MG tablet Take 1 tablet (500 mg total) by mouth 2 (two) times daily. 14 tablet 0   Current Facility-Administered Medications  Medication Dose Route Frequency Provider Last Rate Last Dose  . medroxyPROGESTERone (DEPO-PROVERA) injection 150 mg  150 mg Intramuscular Q90 days Brock Badharles A Faustino Luecke, MD   150 mg at 01/09/15 1537    Review of Systems Review of Systems Constitutional: negative for fatigue and weight loss Respiratory: negative for cough and wheezing Cardiovascular: negative for chest pain, fatigue and palpitations Gastrointestinal: negative for abdominal pain and change in bowel habits Genitourinary: positive for malodorous vaginal discharge with vaginal itching Integument/breast: negative for nipple discharge Musculoskeletal:negative for  myalgias Neurological: negative for gait problems and tremors Behavioral/Psych: negative for abusive relationship, depression Endocrine: negative for temperature intolerance     Blood pressure 123/71, pulse 83, temperature 98.7 F (37.1 C), weight 167 lb (75.751 kg), last menstrual period 02/09/2015.  Physical Exam Physical Exam            General:  Alert and no distress Abdomen:  normal findings: no organomegaly, soft, non-tender and no hernia  Pelvis:  External genitalia: normal general appearance Urinary system: urethral meatus normal and bladder without fullness, nontender Vaginal: normal without tenderness, induration or masses.  Fishy smelling clumpy grayish discharge Cervix: normal appearance Adnexa: normal bimanual exam Uterus: anteverted and non-tender, normal size    Data Reviewed Labs  Assessment     Possible STD exposure STD screening request   BV / Candida vaginitis    Plan    Wet prep and cultures done STD Panel drawn Flagyl / Diflucan Rx for BV / Candida  Orders Placed This Encounter  Procedures  . POCT Urinalysis Dipstick  . POCT urine pregnancy   Meds ordered this encounter  Medications  . metroNIDAZOLE (FLAGYL) 500 MG tablet    Sig: Take 1 tablet (500 mg total) by mouth 2 (two) times daily.    Dispense:  14 tablet    Refill:  0  . fluconazole (DIFLUCAN) 200 MG tablet    Sig: Take 1 tablet every other day.    Dispense:  3 tablet  Refill:  2

## 2015-07-03 ENCOUNTER — Other Ambulatory Visit: Payer: Self-pay | Admitting: Obstetrics

## 2015-07-03 LAB — NUSWAB VG+, CANDIDA 6SP
ATOPOBIUM VAGINAE: HIGH {score} — AB
BVAB 2: HIGH {score} — AB
CANDIDA ALBICANS, NAA: NEGATIVE
CANDIDA GLABRATA, NAA: NEGATIVE
CANDIDA LUSITANIAE, NAA: NEGATIVE
CANDIDA PARAPSILOSIS, NAA: NEGATIVE
Candida krusei, NAA: NEGATIVE
Candida tropicalis, NAA: NEGATIVE
Chlamydia trachomatis, NAA: NEGATIVE
Megasphaera 1: HIGH Score — AB
NEISSERIA GONORRHOEAE, NAA: NEGATIVE
TRICH VAG BY NAA: NEGATIVE

## 2015-07-17 ENCOUNTER — Ambulatory Visit: Payer: Medicaid Other | Admitting: Obstetrics

## 2015-09-25 ENCOUNTER — Ambulatory Visit: Payer: Self-pay | Admitting: Obstetrics

## 2015-10-01 ENCOUNTER — Ambulatory Visit (INDEPENDENT_AMBULATORY_CARE_PROVIDER_SITE_OTHER): Payer: Medicaid Other | Admitting: *Deleted

## 2015-10-01 DIAGNOSIS — Z3201 Encounter for pregnancy test, result positive: Secondary | ICD-10-CM | POA: Diagnosis not present

## 2015-10-01 LAB — POCT URINE PREGNANCY: Preg Test, Ur: POSITIVE — AB

## 2015-10-02 NOTE — Patient Instructions (Signed)
Patient given PNV and advised to RTO for NOB soon after 11/01/2015.

## 2015-10-10 ENCOUNTER — Ambulatory Visit: Payer: Self-pay | Admitting: Obstetrics

## 2015-10-19 ENCOUNTER — Encounter (HOSPITAL_COMMUNITY): Payer: Self-pay | Admitting: Emergency Medicine

## 2015-10-19 ENCOUNTER — Emergency Department (HOSPITAL_COMMUNITY)
Admission: EM | Admit: 2015-10-19 | Discharge: 2015-10-19 | Disposition: A | Discharge status: Home / Self Care | Payer: Medicaid Other | Attending: Emergency Medicine | Admitting: Emergency Medicine

## 2015-10-19 DIAGNOSIS — O99611 Diseases of the digestive system complicating pregnancy, first trimester: Secondary | ICD-10-CM | POA: Insufficient documentation

## 2015-10-19 DIAGNOSIS — Z3A08 8 weeks gestation of pregnancy: Secondary | ICD-10-CM | POA: Insufficient documentation

## 2015-10-19 DIAGNOSIS — F17210 Nicotine dependence, cigarettes, uncomplicated: Secondary | ICD-10-CM | POA: Diagnosis not present

## 2015-10-19 DIAGNOSIS — O219 Vomiting of pregnancy, unspecified: Secondary | ICD-10-CM

## 2015-10-19 DIAGNOSIS — O99330 Smoking (tobacco) complicating pregnancy, unspecified trimester: Secondary | ICD-10-CM | POA: Insufficient documentation

## 2015-10-19 LAB — COMPREHENSIVE METABOLIC PANEL
ALT: 21 U/L (ref 14–54)
ANION GAP: 7 (ref 5–15)
AST: 20 U/L (ref 15–41)
Albumin: 3.9 g/dL (ref 3.5–5.0)
Alkaline Phosphatase: 41 U/L (ref 38–126)
BUN: 5 mg/dL — ABNORMAL LOW (ref 6–20)
CHLORIDE: 106 mmol/L (ref 101–111)
CO2: 24 mmol/L (ref 22–32)
CREATININE: 0.71 mg/dL (ref 0.44–1.00)
Calcium: 9 mg/dL (ref 8.9–10.3)
Glucose, Bld: 97 mg/dL (ref 65–99)
POTASSIUM: 3.7 mmol/L (ref 3.5–5.1)
SODIUM: 137 mmol/L (ref 135–145)
Total Bilirubin: 0.7 mg/dL (ref 0.3–1.2)
Total Protein: 6.1 g/dL — ABNORMAL LOW (ref 6.5–8.1)

## 2015-10-19 LAB — CBC WITH DIFFERENTIAL/PLATELET
Basophils Absolute: 0 10*3/uL (ref 0.0–0.1)
Basophils Relative: 0 %
EOS ABS: 0 10*3/uL (ref 0.0–0.7)
Eosinophils Relative: 1 %
HCT: 34.5 % — ABNORMAL LOW (ref 36.0–46.0)
HEMOGLOBIN: 12 g/dL (ref 12.0–15.0)
LYMPHS ABS: 1.4 10*3/uL (ref 0.7–4.0)
LYMPHS PCT: 33 %
MCH: 29.9 pg (ref 26.0–34.0)
MCHC: 34.8 g/dL (ref 30.0–36.0)
MCV: 85.8 fL (ref 78.0–100.0)
Monocytes Absolute: 0.2 10*3/uL (ref 0.1–1.0)
Monocytes Relative: 5 %
NEUTROS PCT: 61 %
Neutro Abs: 2.6 10*3/uL (ref 1.7–7.7)
Platelets: 238 10*3/uL (ref 150–400)
RBC: 4.02 MIL/uL (ref 3.87–5.11)
RDW: 12.3 % (ref 11.5–15.5)
WBC: 4.3 10*3/uL (ref 4.0–10.5)

## 2015-10-19 LAB — HCG, QUANTITATIVE, PREGNANCY: HCG, BETA CHAIN, QUANT, S: 104030 m[IU]/mL — AB (ref <5)

## 2015-10-19 LAB — LIPASE, BLOOD: LIPASE: 29 U/L (ref 11–51)

## 2015-10-19 MED ORDER — SODIUM CHLORIDE 0.9 % IV BOLUS (SEPSIS)
1000.0000 mL | Freq: Once | INTRAVENOUS | Status: AC
  Administered 2015-10-19: 1000 mL via INTRAVENOUS

## 2015-10-19 MED ORDER — ONDANSETRON 4 MG PO TBDP: 4.0000 mg | ORAL_TABLET | Freq: Three times a day (TID) | ORAL | 0 refills | Status: DC | PRN

## 2015-10-19 MED ORDER — ONDANSETRON HCL 4 MG/2ML IJ SOLN
4.0000 mg | Freq: Once | INTRAMUSCULAR | Status: AC
  Administered 2015-10-19: 4 mg via INTRAVENOUS
  Filled 2015-10-19: qty 2

## 2015-10-19 NOTE — ED Provider Notes (Signed)
[facility]-EMERGENCY DEPT Provider Note   CSN: [id number] Arrival date & time: 10/19/15  4098     History   Chief Complaint Chief Complaint  Patient presents with  . Emesis During Pregnancy  . Hematemesis    HPI Brooke Lucas is a 30 y.o. female.  Patient is a 30 year old female [redacted] weeks pregnant with no pertinent past medical history presents the end of nausea/vomiting. Pt reports over the past 3 weeks she has had intermittent nausea and vomiting. Pt states she ate chinese food last night and around 2am this morning she began to have N/V, nonbloody diarrhea, and a burning sensation in her chest which she notes feels like acid reflux. Pt notes she noticed a small amount of red streaky blood in her emesis this morning. Denies taking any medications for her sxs. Denies fever, chills, HA, cough, SOB, CP, urinary sxs, vaginal bleeding, vaginal d/c. She notes her LMP was on 7/13 and states she took a pregnancy test at her doctor's office which was positive. She notes she has an appointment scheduled with her OB (Dr. Clearance Coots) on 10/30/15. G3P1, reports her second pregnancy was a still born. Denies hx of abdominal surgeries.       Past Medical History:  Diagnosis Date  . Anxiety   . Depression    after loss of son  . Sickle cell trait (HCC)   . Trichomonas     Patient Active Problem List   Diagnosis Date Noted  . Folliculitis 05/05/2013    Past Surgical History:  Procedure Laterality Date  . extraction wisdom      OB History    Gravida Para Term Preterm AB Living   3 2 2  0 0 1   SAB TAB Ectopic Multiple Live Births   0 0 0 0 1       Home Medications    Prior to Admission medications   Medication Sig Start Date End Date Taking? Authorizing Provider  Prenatal Vit-Fe Fumarate-FA (PRENATAL MULTIVITAMIN) TABS tablet Take 1 tablet by mouth daily at 12 noon.   Yes Historical Provider, MD  fluconazole (DIFLUCAN) 200 MG tablet Take 1 tablet every other day. Patient not taking:  Reported on 10/19/2015 06/29/15   Brock Bad, MD  metroNIDAZOLE (FLAGYL) 500 MG tablet Take 1 tablet (500 mg total) by mouth 2 (two) times daily. Patient not taking: Reported on 10/19/2015 06/29/15   Brock Bad, MD  ondansetron (ZOFRAN ODT) 4 MG disintegrating tablet Take 1 tablet (4 mg total) by mouth every 8 (eight) hours as needed for nausea or vomiting. 10/19/15   Barrett Henle, PA-C    Family History Family History  Problem Relation Age of Onset  . Other Neg Hx     Social History Social History  Substance Use Topics  . Smoking status: Current Every Day Smoker    Packs/day: 0.00    Years: 0.00    Types: Cigarettes  . Smokeless tobacco: Never Used  . Alcohol use 0.0 oz/week     Comment: Socially     Allergies   Amoxicillin; Penicillins; and Benadryl [diphenhydramine]   Review of Systems Review of Systems  Gastrointestinal: Positive for abdominal pain (cramping), diarrhea, nausea and vomiting.       Small amount of red streaky blood in emesis  All other systems reviewed and are negative.    Physical Exam Updated Vital Signs BP 108/62 (BP Location: Right Arm)   Pulse 90   Temp 98.4 F (36.9 C) (Oral)  Resp 18   Ht 5\' 7"  (1.702 m)   Wt 76.2 kg   LMP 08/23/2015 (Approximate)   SpO2 99%   BMI 26.31 kg/m   Physical Exam  Constitutional: She is oriented to person, place, and time. She appears well-developed and well-nourished. No distress.  HENT:  Head: Normocephalic and atraumatic.  Mouth/Throat: Uvula is midline, oropharynx is clear and moist and mucous membranes are normal. No oropharyngeal exudate, posterior oropharyngeal edema, posterior oropharyngeal erythema or tonsillar abscesses. No tonsillar exudate.  Eyes: Conjunctivae and EOM are normal. Right eye exhibits no discharge. Left eye exhibits no discharge. No scleral icterus.  Neck: Normal range of motion. Neck supple.  Cardiovascular: Normal rate, regular rhythm, normal heart sounds and  intact distal pulses.   Pulmonary/Chest: Effort normal and breath sounds normal. No respiratory distress. She has no wheezes. She has no rales. She exhibits no tenderness.  Abdominal: Soft. Bowel sounds are normal. She exhibits no distension and no mass. There is tenderness (mild diffuse abdominal tenderness). There is no rebound and no guarding. No hernia.  No CVA tenderness  Musculoskeletal: Normal range of motion. She exhibits no edema.  Neurological: She is alert and oriented to person, place, and time.  Skin: Skin is warm and dry. She is not diaphoretic.  Nursing note and vitals reviewed.    ED Treatments / Results  Labs (all labs ordered are listed, but only abnormal results are displayed) Labs Reviewed  CBC WITH DIFFERENTIAL/PLATELET - Abnormal; Notable for the following:       Result Value   HCT 34.5 (*)    All other components within normal limits  COMPREHENSIVE METABOLIC PANEL - Abnormal; Notable for the following:    BUN 5 (*)    Total Protein 6.1 (*)    All other components within normal limits  HCG, QUANTITATIVE, PREGNANCY - Abnormal; Notable for the following:    hCG, Beta Chain, Quant, S 104,030 (*)    All other components within normal limits  LIPASE, BLOOD    EKG  EKG Interpretation None       Radiology No results found.  Procedures Procedures (including critical care time)  Medications Ordered in ED Medications  sodium chloride 0.9 % bolus 1,000 mL (0 mLs Intravenous Stopped 10/19/15 0814)  ondansetron (ZOFRAN) injection 4 mg (4 mg Intravenous Given 10/19/15 0817)     Initial Impression / Assessment and Plan / ED Course  I have reviewed the triage vital signs and the nursing notes.  Pertinent labs & imaging results that were available during my care of the patient were reviewed by me and considered in my medical decision making (see chart for details).  Clinical Course    Patient presents with nausea and vomiting, reports she is [redacted] weeks pregnant.  G3P1. VSS. Exam revealed mild diffuse abdominal tenderness, moist mucous membranes, remaining exam unremarkable. Patient given IV fluids and Zofran.  Bedside US performed by Dr. Silverio Lay confirmed intrauterine pregnancy with fetal HR 156, fetus measuring appx 8 weeks 1 day.   HCG quant 104,030. Remaining labs unremarkable. On reevaluation patient reports she is feeling significantly better. Patient able to tolerate PO. I suspect patient's symptoms are likely related to morning sickness during her first trimester and do not feel any further workup or imaging is warranted at this time. Discussed results and plan for discharge with patient. Plan to discharge patient home with Zofran and symptomatic treatment. Advised patient to follow up with her OB at her scheduled appointment on 9/19. Discussed strict return  precautions with patient.  Final Clinical Impressions(s) / ED Diagnoses   Final diagnoses:  Nausea and vomiting during pregnancy prior to [redacted] weeks gestation    New Prescriptions Discharge Medication List as of 10/19/2015  9:29 AM    START taking these medications   Details  ondansetron (ZOFRAN ODT) 4 MG disintegrating tablet Take 1 tablet (4 mg total) by mouth every 8 (eight) hours as needed for nausea or vomiting., Starting Fri 10/19/2015, Print         Satira Sarkicole Elizabeth BuckmanNadeau, [place] JerseyPA-C 10/19/15 1003    Charlynne Panderavid Hsienta Yao, MD 10/22/15 1157

## 2015-10-19 NOTE — ED Notes (Signed)
Sprite given for PO challenge.  Pt has been sipping on water previous to this note and has tolerated well.

## 2015-10-19 NOTE — Discharge Instructions (Signed)
Take your medications as prescribed as needed for nausea. Continue taking fluids at home to remain hydrated. Follow-up with your OB at your scheduled appointment on 10/30/15. Please return to the Emergency Department if symptoms worsen or new onset of fever, abdominal pain, vomiting, unable to keep fluids down, vaginal bleeding, vaginal discharge.

## 2015-10-19 NOTE — ED Notes (Signed)
Pt resting quietly at the time. Vital signs stable. 

## 2015-10-19 NOTE — ED Triage Notes (Signed)
Pt. woke up this morning with bloody emesis x2 , denies diarrhea , no fever or chills . She is [redacted] weeks pregnant , denies abdominal pain or vaginal bleeding / discharge .

## 2015-10-25 ENCOUNTER — Telehealth: Payer: Self-pay | Admitting: *Deleted

## 2015-10-25 NOTE — Telephone Encounter (Signed)
Patient seen in MAU for persistent nausea and vomiting.Patient is 9 weeks and has an appointment on 10/30/15 for prenatal care.Dr Clearance CootsHarper ok'd Phenergan 25 mg po every 6 hour prn nausea and vomiting.[facility] [address] [phone].

## 2015-10-27 ENCOUNTER — Encounter (HOSPITAL_COMMUNITY): Payer: Self-pay | Admitting: *Deleted

## 2015-10-27 DIAGNOSIS — R05 Cough: Secondary | ICD-10-CM | POA: Diagnosis not present

## 2015-10-27 DIAGNOSIS — F17210 Nicotine dependence, cigarettes, uncomplicated: Secondary | ICD-10-CM | POA: Diagnosis not present

## 2015-10-27 DIAGNOSIS — Z5321 Procedure and treatment not carried out due to patient leaving prior to being seen by health care provider: Secondary | ICD-10-CM | POA: Insufficient documentation

## 2015-10-27 NOTE — ED Triage Notes (Signed)
The pt is c/o a cough cold for several days  She has a headache sinus drainage and she vomits when she coughs up her sputu,  She is 10 weeks preg  lmp July  edc April 19th

## 2015-10-28 ENCOUNTER — Encounter: Payer: Self-pay | Admitting: *Deleted

## 2015-10-28 ENCOUNTER — Emergency Department (HOSPITAL_COMMUNITY)
Admission: EM | Admit: 2015-10-28 | Discharge: 2015-10-28 | Disposition: A | Discharge status: Home / Self Care | Payer: Medicaid Other | Attending: Dermatology | Admitting: Dermatology

## 2015-10-28 DIAGNOSIS — O0990 Supervision of high risk pregnancy, unspecified, unspecified trimester: Secondary | ICD-10-CM | POA: Insufficient documentation

## 2015-10-28 NOTE — ED Notes (Signed)
Pt came to desk and said that she has an appt on the 19th and that she is feeling better. She said her throat isnt as sore anymore and that she hasnt been coughing since she got here. Encouraged pt to stay but she said she would just go to her appt.

## 2015-10-30 ENCOUNTER — Encounter: Payer: Self-pay | Admitting: Obstetrics

## 2015-10-30 ENCOUNTER — Ambulatory Visit (INDEPENDENT_AMBULATORY_CARE_PROVIDER_SITE_OTHER): Payer: Medicaid Other | Admitting: Obstetrics

## 2015-10-30 VITALS — BP 113/76 | HR 90 | Wt 165.0 lb

## 2015-10-30 DIAGNOSIS — Z3481 Encounter for supervision of other normal pregnancy, first trimester: Secondary | ICD-10-CM | POA: Diagnosis not present

## 2015-10-30 DIAGNOSIS — O09891 Supervision of other high risk pregnancies, first trimester: Secondary | ICD-10-CM | POA: Diagnosis not present

## 2015-10-30 MED ORDER — PROMETHAZINE HCL 25 MG PO TABS: 25.0000 mg | ORAL_TABLET | Freq: Four times a day (QID) | ORAL | 2 refills | Status: DC | PRN

## 2015-10-30 NOTE — Progress Notes (Signed)
Subjective:    Brooke Lucas is being seen today for her first obstetrical visit.  This is not a planned pregnancy. She is at [redacted]w[redacted]d gestation. Her obstetrical history is significant for smoker. Relationship with FOB: significant other, living together. Patient does intend to breast feed. Pregnancy history fully reviewed.  The information documented in the HPI was reviewed and verified.  Menstrual History: OB History    Gravida Para Term Preterm AB Living   3 2 2  0 0 1   SAB TAB Ectopic Multiple Live Births   0 0 0 0 1       Patient's last menstrual period was 08/23/2015 (approximate).    Past Medical History:  Diagnosis Date  . Anxiety   . Depression    after loss of son  . Sickle cell trait (HCC)   . Trichomonas     Past Surgical History:  Procedure Laterality Date  . extraction wisdom       (Not in a hospital admission) Allergies  Allergen Reactions  . Amoxicillin Anaphylaxis  . Penicillins Anaphylaxis  . Benadryl [Diphenhydramine]     Anxiety     Social History  Substance Use Topics  . Smoking status: Current Every Day Smoker    Packs/day: 0.00    Years: 0.00    Types: Cigarettes  . Smokeless tobacco: Never Used     Comment: 1/2 pack per day  . Alcohol use No     Comment: Socially    Family History  Problem Relation Age of Onset  . Other Neg Hx      Review of Systems Constitutional: negative for weight loss Gastrointestinal: negative for vomiting Genitourinary:negative for genital lesions and vaginal discharge and dysuria Musculoskeletal:negative for back pain Behavioral/Psych: negative for abusive relationship, depression, illegal drug usage and tobacco use    Objective:    LMP 08/23/2015 (Approximate)  General Appearance:    Alert, cooperative, no distress, appears stated age  Head:    Normocephalic, without obvious abnormality, atraumatic  Eyes:    PERRL, conjunctiva/corneas clear, EOM's intact, fundi    benign, both eyes  Ears:    Normal  TM's and external ear canals, both ears  Nose:   Nares normal, septum midline, mucosa normal, no drainage    or sinus tenderness  Throat:   Lips, mucosa, and tongue normal; teeth and gums normal  Neck:   Supple, symmetrical, trachea midline, no adenopathy;    thyroid:  no enlargement/tenderness/nodules; no carotid   bruit or JVD  Back:     Symmetric, no curvature, ROM normal, no CVA tenderness  Lungs:     Clear to auscultation bilaterally, respirations unlabored  Chest Wall:    No tenderness or deformity   Heart:    Regular rate and rhythm, S1 and S2 normal, no murmur, rub   or gallop  Breast Exam:    No tenderness, masses, or nipple abnormality  Abdomen:     Soft, non-tender, bowel sounds active all four quadrants,    no masses, no organomegaly  Genitalia:    Normal female without lesion, discharge or tenderness  Extremities:   Extremities normal, atraumatic, no cyanosis or edema  Pulses:   2+ and symmetric all extremities  Skin:   Skin color, texture, turgor normal, no rashes or lesions  Lymph nodes:   Cervical, supraclavicular, and axillary nodes normal  Neurologic:   CNII-XII intact, normal strength, sensation and reflexes    throughout      Lab Review Urine pregnancy test  Labs reviewed yes Radiologic studies reviewed yes  Assessment:    Pregnancy at 245w5d weeks    H/O fetal congenital heart defect and H/O term fetal demise   Plan:     Referred to MFM Prenatal vitamins.  Counseling provided regarding continued use of seat belts, cessation of alcohol consumption, smoking or use of illicit drugs; infection precautions i.e., influenza/TDAP immunizations, toxoplasmosis,CMV, parvovirus, listeria and varicella; workplace safety, exercise during pregnancy; routine dental care, safe medications, sexual activity, hot tubs, saunas, pools, travel, caffeine use, fish and methlymercury, potential toxins, hair treatments, varicose veins Weight gain recommendations per IOM guidelines  reviewed: underweight/BMI< 18.5--> gain 28 - 40 lbs; normal weight/BMI 18.5 - 24.9--> gain 25 - 35 lbs; overweight/BMI 25 - 29.9--> gain 15 - 25 lbs; obese/BMI >30->gain  11 - 20 lbs Problem list reviewed and updated. FIRST/CF mutation testing/NIPT/QUAD SCREEN/fragile X/Ashkenazi Jewish population testing/Spinal muscular atrophy discussed: requested. Role of ultrasound in pregnancy discussed; fetal survey: requested. Amniocentesis discussed: not indicated. VBAC calculator score: VBAC consent form provided No orders of the defined types were placed in this encounter.  No orders of the defined types were placed in this encounter.   Follow up in 4 weeks. 50% of 20 min visit spent on counseling and coordination of care. Patient ID: Brooke Lucas, female   DOB: January 11, 1986, 30 y.o.   MRN: [id number]

## 2015-11-01 ENCOUNTER — Other Ambulatory Visit: Payer: Self-pay | Admitting: Obstetrics

## 2015-11-01 LAB — URINE CULTURE, OB REFLEX: ORGANISM ID, BACTERIA: NO GROWTH

## 2015-11-01 LAB — CULTURE, OB URINE

## 2015-11-02 LAB — PAP IG W/ RFLX HPV ASCU: PAP Smear Comment: 0

## 2015-11-05 ENCOUNTER — Other Ambulatory Visit: Payer: Self-pay | Admitting: Obstetrics

## 2015-11-05 DIAGNOSIS — N760 Acute vaginitis: Principal | ICD-10-CM

## 2015-11-05 DIAGNOSIS — B9689 Other specified bacterial agents as the cause of diseases classified elsewhere: Secondary | ICD-10-CM

## 2015-11-05 LAB — NUSWAB VG+, CANDIDA 6SP
ATOPOBIUM VAGINAE: HIGH {score} — AB
BVAB 2: HIGH Score — AB
CANDIDA LUSITANIAE, NAA: NEGATIVE
CANDIDA PARAPSILOSIS, NAA: NEGATIVE
CANDIDA TROPICALIS, NAA: NEGATIVE
CHLAMYDIA TRACHOMATIS, NAA: NEGATIVE
Candida albicans, NAA: NEGATIVE
Candida glabrata, NAA: NEGATIVE
Candida krusei, NAA: NEGATIVE
MEGASPHAERA 1: HIGH {score} — AB
Neisseria gonorrhoeae, NAA: NEGATIVE
TRICH VAG BY NAA: NEGATIVE

## 2015-11-05 MED ORDER — CLINDAMYCIN HCL 300 MG PO CAPS: 300.0000 mg | ORAL_CAPSULE | Freq: Three times a day (TID) | ORAL | 0 refills | Status: DC

## 2015-11-06 LAB — PRENATAL PROFILE I(LABCORP)
ANTIBODY SCREEN: NEGATIVE
BASOS ABS: 0 10*3/uL (ref 0.0–0.2)
BASOS: 0 %
EOS (ABSOLUTE): 0.1 10*3/uL (ref 0.0–0.4)
Eos: 1 %
HEMATOCRIT: 35.9 % (ref 34.0–46.6)
Hemoglobin: 12.2 g/dL (ref 11.1–15.9)
Hepatitis B Surface Ag: NEGATIVE
Immature Grans (Abs): 0 10*3/uL (ref 0.0–0.1)
Immature Granulocytes: 0 %
LYMPHS ABS: 1.9 10*3/uL (ref 0.7–3.1)
Lymphs: 31 %
MCH: 29.8 pg (ref 26.6–33.0)
MCHC: 34 g/dL (ref 31.5–35.7)
MCV: 88 fL (ref 79–97)
MONOCYTES: 7 %
Monocytes Absolute: 0.4 10*3/uL (ref 0.1–0.9)
NEUTROS ABS: 3.7 10*3/uL (ref 1.4–7.0)
Neutrophils: 61 %
Platelets: 331 10*3/uL (ref 150–379)
RBC: 4.09 x10E6/uL (ref 3.77–5.28)
RDW: 13.1 % (ref 12.3–15.4)
RH TYPE: POSITIVE
RPR: NONREACTIVE
RUBELLA: 1.97 {index} (ref >0.99)
WBC: 6.1 10*3/uL (ref 3.4–10.8)

## 2015-11-06 LAB — HEMOGLOBINOPATHY EVALUATION
HEMOGLOBIN F QUANTITATION: 0 % (ref 0.0–2.0)
HGB A: 56.9 % — AB (ref 94.0–98.0)
HGB C: 0 %
HGB S: 39 % — AB
Hemoglobin A2 Quantitation: 4.1 % — ABNORMAL HIGH (ref 0.7–3.1)

## 2015-11-06 LAB — HIV ANTIBODY (ROUTINE TESTING W REFLEX): HIV SCREEN 4TH GENERATION: NONREACTIVE

## 2015-11-06 LAB — VITAMIN D 25 HYDROXY (VIT D DEFICIENCY, FRACTURES): Vit D, 25-Hydroxy: 16.3 ng/mL — ABNORMAL LOW (ref 30.0–100.0)

## 2015-11-06 LAB — VARICELLA ZOSTER ANTIBODY, IGG: Varicella zoster IgG: 482 index (ref >165)

## 2015-11-06 LAB — TOXASSURE SELECT 13 (MW), URINE

## 2015-11-15 ENCOUNTER — Encounter (HOSPITAL_COMMUNITY): Payer: Self-pay | Admitting: Obstetrics

## 2015-11-20 ENCOUNTER — Ambulatory Visit (INDEPENDENT_AMBULATORY_CARE_PROVIDER_SITE_OTHER): Payer: Medicaid Other | Admitting: Obstetrics

## 2015-11-20 ENCOUNTER — Encounter: Payer: Self-pay | Admitting: Obstetrics

## 2015-11-20 ENCOUNTER — Other Ambulatory Visit (HOSPITAL_COMMUNITY): Payer: Medicaid Other

## 2015-11-20 ENCOUNTER — Encounter: Payer: Self-pay | Admitting: *Deleted

## 2015-11-20 ENCOUNTER — Ambulatory Visit (HOSPITAL_COMMUNITY): Payer: Medicaid Other

## 2015-11-20 VITALS — BP 112/71 | HR 76 | Temp 97.2°F | Wt 173.8 lb

## 2015-11-20 DIAGNOSIS — Z3491 Encounter for supervision of normal pregnancy, unspecified, first trimester: Secondary | ICD-10-CM

## 2015-11-20 NOTE — Progress Notes (Signed)
Subjective:    Brooke Lucas is a 30 y.o. female being seen today for her obstetrical visit. She is at 7884w5d gestation. Patient reports no complaints. Fetal movement: normal.  Problem List Items Addressed This Visit    None    Visit Diagnoses   None.    Patient Active Problem List   Diagnosis Date Noted  . Supervision of normal pregnancy, antepartum 10/28/2015  . Folliculitis 05/05/2013   Objective:    BP 112/71   Pulse 76   Temp 97.2 F (36.2 C)   Wt 173 lb 12 oz (78.8 kg)   LMP 08/23/2015 (Approximate)   BMI 27.21 kg/m  FHT:  150 BPM  Uterine Size: size equals dates  Presentation: unsure     Assessment:    Pregnancy @ 8484w5d weeks   Plan:     labs reviewed, problem list updated Consent signed. GBS sent TDAP offered  Rhogam given for RH negative Pediatrician: discussed. Infant feeding: plans to breastfeed. Maternity leave: discussed. Cigarette smoking: smokes 0.5 PPD. No orders of the defined types were placed in this encounter.  No orders of the defined types were placed in this encounter.  Follow up in 2 Weeks.   Patient ID: Brooke Lucas, female   DOB: 06/12/1985, 30 y.o.   MRN: [id number]

## 2015-11-21 ENCOUNTER — Ambulatory Visit (HOSPITAL_COMMUNITY)
Admission: RE | Admit: 2015-11-21 | Discharge: 2015-11-21 | Disposition: A | Discharge status: Home / Self Care | Payer: Medicaid Other | Source: Ambulatory Visit | Attending: Obstetrics | Admitting: Obstetrics

## 2015-11-21 ENCOUNTER — Encounter (HOSPITAL_COMMUNITY): Payer: Medicaid Other

## 2015-11-21 ENCOUNTER — Encounter (HOSPITAL_COMMUNITY): Payer: Self-pay

## 2015-11-21 ENCOUNTER — Ambulatory Visit (HOSPITAL_COMMUNITY): Admission: RE | Admit: 2015-11-21 | Payer: Medicaid Other | Source: Ambulatory Visit

## 2015-11-21 ENCOUNTER — Other Ambulatory Visit: Payer: Self-pay | Admitting: Obstetrics

## 2015-11-21 VITALS — BP 120/53 | HR 85 | Wt 173.6 lb

## 2015-11-21 DIAGNOSIS — O352XX0 Maternal care for (suspected) hereditary disease in fetus, not applicable or unspecified: Secondary | ICD-10-CM

## 2015-11-21 DIAGNOSIS — O09299 Supervision of pregnancy with other poor reproductive or obstetric history, unspecified trimester: Secondary | ICD-10-CM

## 2015-11-21 DIAGNOSIS — Z3A12 12 weeks gestation of pregnancy: Secondary | ICD-10-CM | POA: Diagnosis not present

## 2015-11-21 DIAGNOSIS — O99331 Smoking (tobacco) complicating pregnancy, first trimester: Secondary | ICD-10-CM

## 2015-11-21 DIAGNOSIS — O09292 Supervision of pregnancy with other poor reproductive or obstetric history, second trimester: Secondary | ICD-10-CM | POA: Diagnosis not present

## 2015-11-21 DIAGNOSIS — Z3491 Encounter for supervision of normal pregnancy, unspecified, first trimester: Secondary | ICD-10-CM

## 2015-11-21 DIAGNOSIS — Z315 Encounter for genetic counseling: Secondary | ICD-10-CM | POA: Diagnosis not present

## 2015-11-21 DIAGNOSIS — Z3682 Encounter for antenatal screening for nuchal translucency: Secondary | ICD-10-CM | POA: Diagnosis not present

## 2015-11-21 NOTE — Progress Notes (Signed)
Genetic Counseling  Visit Summary Note  Appointment Date: 11/21/2015 Referred By: Shelly Bombard, MD  Date of Birth: 03-21-1985  Pregnancy history: G3P2001 Estimated Date of Delivery: 05/29/16 Estimated Gestational Age: [redacted]w[redacted]d I met with Ms. JANAPAOLA KINSELand her partner, LMurriel Hopper for genetic counseling because of a family history of a congenital heart defect.  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 Ms. PStigallreported that her first child a son, JNena Alexander was identified at a month of age as having an AV canal defect.  This was subsequently repaired.  He is doing well at 29years of age and other than being unable to play contact sports, he is a typical 920year old child.  There is no other history of congenital heart defects in the family.  We discussed that congenital heart defects occur in approximately 1% of pregnancies.  Congenital heart defects may occur due to multifactorial influences, chromosomal abnormalities, genetic syndromes or environmental exposures.  Isolated heart defects are generally multifactorial.  Given the reported family history and assuming multifactorial inheritance, the risk for a congenital heart defect in the current pregnancy is no greater than 3-5%.  Ms. PDoxtaterreported that her second pregnancy resulted in a stillbirth at term.  She stated that she went into labor and was feeling fetal movement at that time.  However, when she arrived at the hospital, there was no heartbeat.  She had prenatal care  during that pregnancy and had fetal echocardiograms because of Jashaun's heart defect.  She stated that the fetal echocardiograms were reported to be normal, and that an autopsy did not identify a heart birth defect in her son, JPaulla Dolly  Ms. PSolterostated that Jamill's umbilical cord was hypercoiled, and she was told this was a possible explanation for him being stillborn.  If JPaulla Dollywas stillborn because of his umbilical cord, we would not expect this to increase the chance for a similar condition in a subsequent pregnancy.  Ms. PStonerockreported that she has a maternal half sister who had a daughter die at 124months of age with multiple anomalies including a partial facial paralysis and abnormal feet and toes.  She was unable to speak or walk at 16 months.  Ms. PRahncould not remember the name of her niece's diagnosis, but believes she may have an an in utero stroke.    Mr. ALeward Quanreported that he had 3 pregnancies with a woman when he was a teenager.  He does not know the specific outcome of any of those pregnancies, but knows they resulted in miscarriage or fetal demise.  He stated that she was much older than him and he believes she had difficulty carrying a pregnancy.    The family histories were otherwise found to be noncontributory for birth defects, mental retardation, and known genetic conditions. Without further information regarding the provided family history, an accurate genetic risk cannot be calculated. Further genetic counseling is warranted if more information is obtained.  Ms. PKrahnreported that she is a carrier for sickle cell anemia.  Mr. ALeward Quan  believes he is not a carrier for sickle cell anemia, but does not have test results to confirm this.  We discussed that there are other hemoglobin variants, other than hemoglobin S, which when inherited together with hemoglobin S can result in a clinically significant hemoglobinopathy.  He declined the option of carrier screening today. We  discussed that sickle cell anemia (SCA) is a hemoglobinopathy in which there is an inherited structural abnormality in one of the globin chains, specifically the beta globin chain.  It is characterized by episodes of vaso-occlusive crisis and chronic anemia due to the tendency of red blood cells to become deformed under conditions of decreased oxygen tension.  The basic defect in SCA involves the change of a single amino acid altering the configuration of the hemoglobin molecule causing the cells to sickle and to obstruct blood flow in small vessels.  Ischemia of tissues and organs results.  Increased cell fragility with increased phagocytosis and splenic sequestration of the fragile cells produces anemia.  SCA (Hgb SS) is inherited as an autosomal recessive condition.  The carrier state is Hgb AS. They were counseled about the 1 in 4 (25%) chance with each pregnancy to have a child with SCA when both parents are carriers for SCA (Hgb AS) or another hemoglobin variant.  There is also a 1 in 4 chance to have a child who is not a carrier, or affected with SCA (Hgb AA) and a 1 in 2 chance to have a child who is a carrier for SCA (Hgb AS).  We reviewed that early diagnosis of SCA is facilitated by newborn screening before the onset of symptoms.  Clinical manifestations usually present in the first or second year of life.   Ms. Billard was provided with written information regarding cystic fibrosis (CF), spinal muscular atrophy (SMA) and hemoglobinopathies including the carrier frequency, availability of carrier screening and prenatal diagnosis if indicated.  In addition, we discussed that CF and hemoglobinopathies are routinely screened for as part of the [place] newborn screening panel.  After further discussion, she declined screening for CF, SMA and hemoglobinopathies.  Ms. Wilms denied exposure to environmental toxins or chemical agents. She denied the use of alcohol or street drugs, but reports smoking cigarettes.   She was encouraged in smoking cessation. She denied significant viral illnesses during the course of her pregnancy. Her medical and surgical histories were noncontributory.   I counseled this couple regarding the above risks and available options.  The approximate face-to-face time with the genetic counselor was 50 minutes.  Cam Hai, MS Certified Genetic Counselor

## 2015-11-30 ENCOUNTER — Other Ambulatory Visit (HOSPITAL_COMMUNITY): Payer: Self-pay

## 2015-12-17 ENCOUNTER — Emergency Department (HOSPITAL_COMMUNITY): Payer: Medicaid Other

## 2015-12-17 ENCOUNTER — Emergency Department (HOSPITAL_COMMUNITY)
Admission: EM | Admit: 2015-12-17 | Discharge: 2015-12-17 | Disposition: A | Discharge status: Home / Self Care | Payer: Medicaid Other | Attending: Emergency Medicine | Admitting: Emergency Medicine

## 2015-12-17 ENCOUNTER — Encounter (HOSPITAL_COMMUNITY): Payer: Self-pay

## 2015-12-17 DIAGNOSIS — Z3A16 16 weeks gestation of pregnancy: Secondary | ICD-10-CM | POA: Diagnosis not present

## 2015-12-17 DIAGNOSIS — O9989 Other specified diseases and conditions complicating pregnancy, childbirth and the puerperium: Secondary | ICD-10-CM | POA: Diagnosis present

## 2015-12-17 DIAGNOSIS — R6889 Other general symptoms and signs: Secondary | ICD-10-CM

## 2015-12-17 DIAGNOSIS — O99332 Smoking (tobacco) complicating pregnancy, second trimester: Secondary | ICD-10-CM | POA: Diagnosis not present

## 2015-12-17 DIAGNOSIS — R1011 Right upper quadrant pain: Secondary | ICD-10-CM

## 2015-12-17 DIAGNOSIS — F17210 Nicotine dependence, cigarettes, uncomplicated: Secondary | ICD-10-CM | POA: Insufficient documentation

## 2015-12-17 LAB — COMPREHENSIVE METABOLIC PANEL
ALBUMIN: 3.7 g/dL (ref 3.5–5.0)
ALT: 26 U/L (ref 14–54)
AST: 28 U/L (ref 15–41)
Alkaline Phosphatase: 39 U/L (ref 38–126)
Anion gap: 9 (ref 5–15)
BUN: 5 mg/dL — AB (ref 6–20)
CHLORIDE: 106 mmol/L (ref 101–111)
CO2: 21 mmol/L — AB (ref 22–32)
CREATININE: 0.68 mg/dL (ref 0.44–1.00)
Calcium: 9.3 mg/dL (ref 8.9–10.3)
GFR calc Af Amer: >60 mL/min (ref >60)
GFR calc non Af Amer: >60 mL/min (ref >60)
GLUCOSE: 87 mg/dL (ref 65–99)
POTASSIUM: 3.7 mmol/L (ref 3.5–5.1)
SODIUM: 136 mmol/L (ref 135–145)
Total Bilirubin: 0.7 mg/dL (ref 0.3–1.2)
Total Protein: 7 g/dL (ref 6.5–8.1)

## 2015-12-17 LAB — CBC WITH DIFFERENTIAL/PLATELET
BASOS ABS: 0 10*3/uL (ref 0.0–0.1)
BASOS PCT: 0 %
EOS ABS: 0.1 10*3/uL (ref 0.0–0.7)
EOS PCT: 1 %
HCT: 33.3 % — ABNORMAL LOW (ref 36.0–46.0)
Hemoglobin: 11.5 g/dL — ABNORMAL LOW (ref 12.0–15.0)
LYMPHS PCT: 16 %
Lymphs Abs: 1.1 10*3/uL (ref 0.7–4.0)
MCH: 29.3 pg (ref 26.0–34.0)
MCHC: 34.5 g/dL (ref 30.0–36.0)
MCV: 84.7 fL (ref 78.0–100.0)
MONO ABS: 0.5 10*3/uL (ref 0.1–1.0)
Monocytes Relative: 7 %
Neutro Abs: 5.1 10*3/uL (ref 1.7–7.7)
Neutrophils Relative %: 76 %
PLATELETS: 288 10*3/uL (ref 150–400)
RBC: 3.93 MIL/uL (ref 3.87–5.11)
RDW: 12.4 % (ref 11.5–15.5)
WBC: 6.8 10*3/uL (ref 4.0–10.5)

## 2015-12-17 LAB — LIPASE, BLOOD: LIPASE: 25 U/L (ref 11–51)

## 2015-12-17 LAB — URINALYSIS, ROUTINE W REFLEX MICROSCOPIC
Bilirubin Urine: NEGATIVE
GLUCOSE, UA: NEGATIVE mg/dL
HGB URINE DIPSTICK: NEGATIVE
Ketones, ur: 40 mg/dL — AB
LEUKOCYTES UA: NEGATIVE
Nitrite: NEGATIVE
PROTEIN: NEGATIVE mg/dL
SPECIFIC GRAVITY, URINE: 1.012 (ref 1.005–1.030)
pH: 6 (ref 5.0–8.0)

## 2015-12-17 MED ORDER — ONDANSETRON 4 MG PO TBDP: 4.0000 mg | ORAL_TABLET | Freq: Three times a day (TID) | ORAL | 0 refills | Status: DC | PRN

## 2015-12-17 MED ORDER — PROMETHAZINE HCL 25 MG/ML IJ SOLN
25.0000 mg | Freq: Once | INTRAMUSCULAR | Status: DC
  Filled 2015-12-17: qty 1

## 2015-12-17 MED ORDER — ACETAMINOPHEN 325 MG PO TABS
650.0000 mg | ORAL_TABLET | Freq: Once | ORAL | Status: AC
  Administered 2015-12-17: 650 mg via ORAL
  Filled 2015-12-17: qty 2

## 2015-12-17 MED ORDER — PROMETHAZINE HCL 25 MG PO TABS
25.0000 mg | ORAL_TABLET | Freq: Once | ORAL | Status: AC
  Administered 2015-12-17: 25 mg via ORAL
  Filled 2015-12-17: qty 1

## 2015-12-17 NOTE — ED Notes (Signed)
Pt given saltine crackers and ice chips.

## 2015-12-17 NOTE — ED Triage Notes (Signed)
Pt to desk asking when she would be moved to a room because other patients are going in front of her. Explained to patient the process of movement according to acuity, pt voiced understanding. Informed patient that she was second in line for room.

## 2015-12-17 NOTE — ED Triage Notes (Signed)
Pt complaining of nausea and vomiting. Headache and stuffy nose. Pt complaining of chills. Pt states [redacted] weeks pregnant.

## 2015-12-17 NOTE — Discharge Instructions (Signed)
Use zofran sparingly and only when absolutely necessary given risk of potential danger to baby. Follow-up with your OB tomorrow. [facility]

## 2015-12-17 NOTE — ED Notes (Signed)
Pt returned from US

## 2015-12-17 NOTE — ED Provider Notes (Signed)
Patient received in sign out from GeorgiaPA Gekas at shift change.  See her note for full H&P.  Briefly, 30 y.o. F approx [redacted] weeks gestation here with flu like symptoms-- cough, sore throat, body aches, headaches, nausea, vomiting, abdominal cramping. No vaginal bleeding or loss of fluids.  Has had prenatal care up to this point.  FHR 166 here, + fetal movement in ED.  Plan:  Labs, ultrasound pending.  Patient received phenergan and tylenol thus far with improvement of symptoms.  Will continue to monitor.  Results for orders placed or performed during the hospital encounter of 12/17/15  CBC with Differential  Result Value Ref Range   WBC 6.8 4.0 - 10.5 K/uL   RBC 3.93 3.87 - 5.11 MIL/uL   Hemoglobin 11.5 (L) 12.0 - 15.0 g/dL   HCT 16.133.3 (L) 09.636.0 - 04.546.0 %   MCV 84.7 78.0 - 100.0 fL   MCH 29.3 26.0 - 34.0 pg   MCHC 34.5 30.0 - 36.0 g/dL   RDW 40.912.4 81.111.5 - 91.415.5 %   Platelets 288 150 - 400 K/uL   Neutrophils Relative % 76 %   Neutro Abs 5.1 1.7 - 7.7 K/uL   Lymphocytes Relative 16 %   Lymphs Abs 1.1 0.7 - 4.0 K/uL   Monocytes Relative 7 %   Monocytes Absolute 0.5 0.1 - 1.0 K/uL   Eosinophils Relative 1 %   Eosinophils Absolute 0.1 0.0 - 0.7 K/uL   Basophils Relative 0 %   Basophils Absolute 0.0 0.0 - 0.1 K/uL  Comprehensive metabolic panel  Result Value Ref Range   Sodium 136 135 - 145 mmol/L   Potassium 3.7 3.5 - 5.1 mmol/L   Chloride 106 101 - 111 mmol/L   CO2 21 (L) 22 - 32 mmol/L   Glucose, Bld 87 65 - 99 mg/dL   BUN 5 (L) 6 - 20 mg/dL   Creatinine, Ser 7.820.68 0.44 - 1.00 mg/dL   Calcium 9.3 8.9 - 95.610.3 mg/dL   Total Protein 7.0 6.5 - 8.1 g/dL   Albumin 3.7 3.5 - 5.0 g/dL   AST 28 15 - 41 U/L   ALT 26 14 - 54 U/L   Alkaline Phosphatase 39 38 - 126 U/L   Total Bilirubin 0.7 0.3 - 1.2 mg/dL   GFR calc non Af Amer >60 >60 mL/min   GFR calc Af Amer >60 >60 mL/min   Anion gap 9 5 - 15  Lipase, blood  Result Value Ref Range   Lipase 25 11 - 51 U/L  Urinalysis, Routine w reflex  microscopic (not [facility])  Result Value Ref Range   Color, Urine YELLOW YELLOW   APPearance CLEAR CLEAR   Specific Gravity, Urine 1.012 1.005 - 1.030   pH 6.0 5.0 - 8.0   Glucose, UA NEGATIVE NEGATIVE mg/dL   Hgb urine dipstick NEGATIVE NEGATIVE   Bilirubin Urine NEGATIVE NEGATIVE   Ketones, ur 40 (A) NEGATIVE mg/dL   Protein, ur NEGATIVE NEGATIVE mg/dL   Nitrite NEGATIVE NEGATIVE   Leukocytes, UA NEGATIVE NEGATIVE   [place] Mfm Fetal Nuchal Translucency  Result Date: 11/21/2015 OBSTETRICAL ULTRASOUND: This exam was performed within a [facility]. The OB US report was generated in the AS system, and faxed to the ordering physician.  This report is available in the [facility] PACS. See the [facility] Obstetric US report via the Image Link.  [place] Abdomen Limited Ruq  Result Date: 12/17/2015 CLINICAL DATA:  Right upper quadrant pain for 3 days. EXAM: [place]  ABDOMEN LIMITED - RIGHT UPPER QUADRANT COMPARISON:  CT abdomen and pelvis 09/08/2010. FINDINGS: Gallbladder: No gallstones or wall thickening visualized. No sonographic Murphy sign noted by sonographer. Common bile duct: Diameter: 0.3 cm. Liver: No focal lesion identified. Within normal limits in parenchymal echogenicity. IMPRESSION: Negative for gallstones.  Negative exam. Electronically Signed   By: Drusilla Kannerhomas  Dalessio M.D.   On: 12/17/2015 21:52    10:11 PM Lab work reassuring.  US negative for gallstones or other acute abnormality.  Patient tolerating oral fluids and crackers here.  Will d/c home.  Was given small supply of nausea meds for home.  Discussed potential birth defects from taking Zofran, recommended to take sparingly and only when absolutely needed. Patient acknowledged these risks and states she understands.  She will follow-up with her OB tomorrow. I've encouraged her to go to [facility] should she have any ongoing issues.   Garlon HatchetLisa M Foye Haggart, PA-C 12/18/15 0002    Nira ConnPedro Eduardo Cardama, MD 12/18/15 [phone]

## 2015-12-17 NOTE — ED Notes (Signed)
Patient transported to Ultrasound 

## 2015-12-17 NOTE — ED Provider Notes (Signed)
[facility]-EMERGENCY DEPT Provider Note   CSN: [id number] Arrival date & time: 12/17/15  1625     History   Chief Complaint Chief Complaint  Patient presents with  . Generalized Body Aches  . Nausea    HPI Brooke Lucas is a 30 y.o. female who presents with flu-like illness. She is [redacted] weeks pregnant and has had regular prenatal care. She reports chills, generalized myalgias, headache, nasal congestion, sore throat, cough, nausea, vomiting, abdominal pain. Her symptoms have been ongoing for the past 4 days. The abdominal pain is generalized and cramping. She has been taking Robitussin with no relief of her symptoms. She denies chest pain, shortness of breath, urinary symptoms, vaginal bleeding. She states she had the flu last January which feels similar. She states she felt her baby move earlier today but hasn't felt anything since. She is anxious about this because she has a history of having a stillborn. Dr. Clearance CootsHarper is her OBGYN - last seen at 12 weeks.  HPI  Past Medical History:  Diagnosis Date  . Anxiety   . Depression    after loss of son  . Sickle cell trait (HCC)   . Trichomonas     Patient Active Problem List   Diagnosis Date Noted  . Supervision of normal pregnancy, antepartum 10/28/2015  . Folliculitis 05/05/2013    Past Surgical History:  Procedure Laterality Date  . extraction wisdom      OB History    Gravida Para Term Preterm AB Living   3 2 2  0 0 1   SAB TAB Ectopic Multiple Live Births   0 0 0 0 1       Home Medications    Prior to Admission medications   Medication Sig Start Date End Date Taking? Authorizing Provider  dextromethorphan (DELSYM) 30 MG/5ML liquid Take 60 mg by mouth as needed for cough.   Yes Historical Provider, MD  Prenatal Vit-Fe Fumarate-FA (PRENATAL MULTIVITAMIN) TABS tablet Take 1 tablet by mouth every evening.    Yes Historical Provider, MD  clindamycin (CLEOCIN) 300 MG capsule Take 1 capsule (300 mg total) by mouth 3 (three)  times daily. Patient not taking: Reported on 11/21/2015 11/05/15   Brock Badharles A Harper, MD  ondansetron (ZOFRAN ODT) 4 MG disintegrating tablet Take 1 tablet (4 mg total) by mouth every 8 (eight) hours as needed for nausea or vomiting. Patient not taking: Reported on 12/17/2015 10/19/15   Barrett HenleNicole Elizabeth Nadeau, PA-C  promethazine (PHENERGAN) 25 MG tablet Take 1 tablet (25 mg total) by mouth every 6 (six) hours as needed for nausea. Patient not taking: Reported on 12/17/2015 10/30/15   Brock Badharles A Harper, MD    Family History Family History  Problem Relation Age of Onset  . Other Neg Hx     Social History Social History  Substance Use Topics  . Smoking status: Current Every Day Smoker    Packs/day: 0.00    Years: 0.00    Types: Cigarettes  . Smokeless tobacco: Never Used     Comment: 1/2 pack per day  . Alcohol use No     Comment: Socially     Allergies   Amoxicillin; Penicillins; and Benadryl [diphenhydramine]   Review of Systems Review of Systems  Constitutional: Positive for chills. Negative for appetite change and fever.  HENT: Positive for congestion, rhinorrhea and sore throat. Negative for ear pain, trouble swallowing and voice change.   Respiratory: Negative for shortness of breath.   Cardiovascular: Negative for chest pain.  Gastrointestinal: Positive for abdominal pain, nausea and vomiting. Negative for constipation and diarrhea.  Genitourinary: Negative for dysuria, flank pain and vaginal bleeding.  All other systems reviewed and are negative.    Physical Exam Updated Vital Signs BP 125/61 (BP Location: Left Arm)   Pulse 92   Temp 98.3 F (36.8 C) (Oral)   Resp 17   LMP 08/23/2015 (Approximate)   SpO2 100%   Physical Exam  Constitutional: She is oriented to person, place, and time. She appears well-developed and well-nourished. No distress.  HENT:  Head: Normocephalic and atraumatic.  Nose: Mucosal edema and rhinorrhea present.  Mouth/Throat: Uvula is  midline and mucous membranes are normal. Posterior oropharyngeal erythema present. No oropharyngeal exudate, posterior oropharyngeal edema or tonsillar abscesses. No tonsillar exudate.  Eyes: Conjunctivae are normal. Pupils are equal, round, and reactive to light. Right eye exhibits no discharge. Left eye exhibits no discharge. No scleral icterus.  Neck: Normal range of motion. Neck supple.  Cardiovascular: Normal rate and regular rhythm.  Exam reveals no gallop and no friction rub.   No murmur heard. Pulmonary/Chest: Effort normal and breath sounds normal. No respiratory distress. She has no wheezes. She has no rales. She exhibits no tenderness.  Abdominal: Soft. Bowel sounds are normal. She exhibits no distension and no mass. There is tenderness. There is no rebound and no guarding. No hernia.  Gravid uterus. RUQ tenderness  Musculoskeletal: She exhibits no edema.  Neurological: She is alert and oriented to person, place, and time.  Skin: Skin is warm and dry.  Psychiatric: Her behavior is normal. Her mood appears anxious.  Nursing note and vitals reviewed.    ED Treatments / Results  Labs (all labs ordered are listed, but only abnormal results are displayed) Labs Reviewed  CBC WITH DIFFERENTIAL/PLATELET  COMPREHENSIVE METABOLIC PANEL  LIPASE, BLOOD  URINALYSIS, ROUTINE W REFLEX MICROSCOPIC (NOT AT [facility])    EKG  EKG Interpretation None       Radiology No results found.  Procedures Procedures (including critical care time)  Medications Ordered in ED Medications  acetaminophen (TYLENOL) tablet 650 mg (650 mg Oral Given 12/17/15 2029)  promethazine (PHENERGAN) tablet 25 mg (25 mg Oral Given 12/17/15 2029)     Initial Impression / Assessment and Plan / ED Course  I have reviewed the triage vital signs and the nursing notes.  Pertinent labs & imaging results that were available during my care of the patient were reviewed by me and considered in my medical decision making  (see chart for details).  Clinical Course    30 year old female with flu like illness. Patient is afebrile, not tachycardic or tachypneic, normotensive, and not hypoxic. FHT checked and were recorded at 166. Phenergan given PO and patient tolerated well. Tylenol given for pain.    On recheck, patient reports feeling much better after meds. Labs and UA are pending. Since patient is tender in RUQ will check US. Anticipate d/c and follow up tomorrow with OBGYN. Signed patient out to L Sanders PA-C at shift change pending results.  Final Clinical Impressions(s) / ED Diagnoses   Final diagnoses:  RUQ pain    New Prescriptions New Prescriptions   No medications on file     Bethel BornKelly Marie Jonathandavid Marlett, PA-C 12/17/15 2047    Nira ConnPedro Eduardo Cardama, MD 12/18/15 2358

## 2015-12-17 NOTE — ED Notes (Addendum)
Patient reports cough and generalized body aches.  Talk to OB and ok'd her to take robitussin reports everytime she takes it she vomits. Patient reports she is coughing a lot.

## 2015-12-18 ENCOUNTER — Encounter: Payer: Self-pay | Admitting: Obstetrics

## 2015-12-18 ENCOUNTER — Ambulatory Visit (INDEPENDENT_AMBULATORY_CARE_PROVIDER_SITE_OTHER): Payer: Medicaid Other | Admitting: Obstetrics

## 2015-12-18 VITALS — BP 118/64 | HR 91 | Temp 97.3°F | Wt 177.6 lb

## 2015-12-18 DIAGNOSIS — Z3492 Encounter for supervision of normal pregnancy, unspecified, second trimester: Secondary | ICD-10-CM

## 2015-12-18 DIAGNOSIS — Z3490 Encounter for supervision of normal pregnancy, unspecified, unspecified trimester: Secondary | ICD-10-CM

## 2015-12-18 MED ORDER — OB COMPLETE PETITE 35-5-1-200 MG PO CAPS: 1.0000 | ORAL_CAPSULE | Freq: Every day | ORAL | 3 refills | Status: DC

## 2015-12-18 NOTE — Progress Notes (Signed)
  Subjective:    Brooke Lucas is a 30 y.o. female being seen today for her obstetrical visit. She is at 2870w5d gestation. Patient reports: nausea.  Problem List Items Addressed This Visit    None    Visit Diagnoses    Encounter for supervision of low-risk pregnancy, antepartum    -  Primary   Relevant Medications   Prenat-FeCbn-FeAspGl-FA-Omega (OB COMPLETE PETITE) 35-5-1-200 MG CAPS   Other Relevant Orders   AFP, Quad Screen   US OB Comp + 14 Wk     Patient Active Problem List   Diagnosis Date Noted  . Supervision of normal pregnancy, antepartum 10/28/2015  . Folliculitis 05/05/2013    Objective:     BP 118/64   Pulse 91   Temp 97.3 F (36.3 C)   Wt 177 lb 9.6 oz (80.6 kg)   LMP 08/23/2015 (Approximate)   BMI 27.82 kg/m  Uterine Size: Below umbilicus     Assessment:    Pregnancy @ 5970w5d  weeks Doing well    Plan:    Problem list reviewed and updated. Labs reviewed.  Follow up in 4 weeks. FIRST/CF mutation testing/NIPT/QUAD SCREEN/fragile X/Ashkenazi Jewish population testing/Spinal muscular atrophy discussed: requested. Role of ultrasound in pregnancy discussed; fetal survey: requested. Amniocentesis discussed: not indicated. 50% of 15 minute visit spent on counseling and coordination of care.

## 2015-12-20 LAB — AFP, QUAD SCREEN
DIA Mom Value: 0.97
DIA Value (EIA): 153.26 pg/mL
DSR (By Age)    1 IN: 716
DSR (Second Trimester) 1 IN: 2444
GESTATIONAL AGE AFP: 16.7 wk
MSAFP MOM: 1.08
MSAFP: 38.4 ng/mL
MSHCG MOM: 1.17
MSHCG: 35379 m[IU]/mL
Maternal Age At EDD: 29.8 YEARS
Osb Risk: 10000
Test Results:: NEGATIVE
UE3 MOM: 0.76
UE3 VALUE: 0.71 ng/mL
WEIGHT: 177 [lb_av]

## 2016-01-01 ENCOUNTER — Encounter (HOSPITAL_COMMUNITY): Payer: Self-pay

## 2016-01-02 ENCOUNTER — Other Ambulatory Visit (HOSPITAL_COMMUNITY): Payer: Self-pay | Admitting: Maternal and Fetal Medicine

## 2016-01-02 ENCOUNTER — Ambulatory Visit (HOSPITAL_COMMUNITY)
Admission: RE | Admit: 2016-01-02 | Discharge: 2016-01-02 | Disposition: A | Discharge status: Home / Self Care | Payer: Medicaid Other | Source: Ambulatory Visit | Attending: Obstetrics | Admitting: Obstetrics

## 2016-01-02 ENCOUNTER — Encounter (HOSPITAL_COMMUNITY): Payer: Self-pay

## 2016-01-02 DIAGNOSIS — Z36 Encounter for antenatal screening for chromosomal anomalies: Secondary | ICD-10-CM | POA: Diagnosis not present

## 2016-01-02 DIAGNOSIS — O09299 Supervision of pregnancy with other poor reproductive or obstetric history, unspecified trimester: Secondary | ICD-10-CM

## 2016-01-02 DIAGNOSIS — O09292 Supervision of pregnancy with other poor reproductive or obstetric history, second trimester: Secondary | ICD-10-CM | POA: Insufficient documentation

## 2016-01-02 DIAGNOSIS — Z3A18 18 weeks gestation of pregnancy: Secondary | ICD-10-CM | POA: Diagnosis not present

## 2016-01-07 ENCOUNTER — Other Ambulatory Visit (HOSPITAL_COMMUNITY): Payer: Self-pay | Admitting: *Deleted

## 2016-01-07 DIAGNOSIS — O09299 Supervision of pregnancy with other poor reproductive or obstetric history, unspecified trimester: Secondary | ICD-10-CM

## 2016-01-15 ENCOUNTER — Ambulatory Visit (INDEPENDENT_AMBULATORY_CARE_PROVIDER_SITE_OTHER): Payer: Medicaid Other | Admitting: Obstetrics

## 2016-01-15 ENCOUNTER — Encounter: Payer: Self-pay | Admitting: Obstetrics

## 2016-01-15 VITALS — BP 100/60 | HR 90 | Temp 97.1°F | Wt 186.9 lb

## 2016-01-15 DIAGNOSIS — Z3492 Encounter for supervision of normal pregnancy, unspecified, second trimester: Secondary | ICD-10-CM | POA: Diagnosis not present

## 2016-01-15 DIAGNOSIS — Z3490 Encounter for supervision of normal pregnancy, unspecified, unspecified trimester: Secondary | ICD-10-CM

## 2016-01-15 NOTE — Addendum Note (Signed)
Addended by: Francene FindersJAMES, Oprah Camarena C on: 01/15/2016 02:58 PM   Modules accepted: Orders

## 2016-01-15 NOTE — Progress Notes (Signed)
Subjective:    Brooke Lucas is a 30 y.o. female being seen today for her obstetrical visit. She is at 3554w5d gestation. Patient reports: no complaints . Fetal movement: normal.  Problem List Items Addressed This Visit    None    Visit Diagnoses    Encounter for supervision of low-risk pregnancy, antepartum    -  Primary     Patient Active Problem List   Diagnosis Date Noted  . Supervision of normal pregnancy, antepartum 10/28/2015  . Folliculitis 05/05/2013   Objective:    BP 100/60   Pulse 90   Temp 97.1 F (36.2 C)   Wt 186 lb 14.4 oz (84.8 kg)   LMP 08/23/2015 (Approximate)   BMI 29.27 kg/m  FHT: 150 BPM  Uterine Size: size equals dates     Assessment:    Pregnancy @ 8954w5d    Plan:    Signs and symptoms of preterm labor: discussed.  Labs, problem list reviewed and updated 2 hr GTT planned Follow up in 4 weeks.

## 2016-01-15 NOTE — Addendum Note (Signed)
Addended by: Francene FindersJAMES, QUINETTA C on: 01/15/2016 02:57 PM   Modules accepted: Orders

## 2016-01-18 LAB — NUSWAB BV AND CANDIDA, NAA
CANDIDA ALBICANS, NAA: NEGATIVE
CANDIDA GLABRATA, NAA: NEGATIVE

## 2016-02-08 ENCOUNTER — Emergency Department (HOSPITAL_COMMUNITY)
Admission: EM | Admit: 2016-02-08 | Discharge: 2016-02-08 | Disposition: A | Discharge status: Home / Self Care | Payer: Medicaid Other | Attending: Emergency Medicine | Admitting: Emergency Medicine

## 2016-02-08 ENCOUNTER — Encounter (HOSPITAL_COMMUNITY): Payer: Self-pay | Admitting: Emergency Medicine

## 2016-02-08 DIAGNOSIS — K529 Noninfective gastroenteritis and colitis, unspecified: Secondary | ICD-10-CM | POA: Insufficient documentation

## 2016-02-08 DIAGNOSIS — F17210 Nicotine dependence, cigarettes, uncomplicated: Secondary | ICD-10-CM | POA: Diagnosis not present

## 2016-02-08 DIAGNOSIS — O99612 Diseases of the digestive system complicating pregnancy, second trimester: Secondary | ICD-10-CM | POA: Diagnosis present

## 2016-02-08 DIAGNOSIS — Z3A24 24 weeks gestation of pregnancy: Secondary | ICD-10-CM | POA: Insufficient documentation

## 2016-02-08 DIAGNOSIS — Z79899 Other long term (current) drug therapy: Secondary | ICD-10-CM | POA: Insufficient documentation

## 2016-02-08 DIAGNOSIS — O0281 Inappropriate change in quantitative human chorionic gonadotropin (hCG) in early pregnancy: Secondary | ICD-10-CM | POA: Diagnosis not present

## 2016-02-08 LAB — CBC WITH DIFFERENTIAL/PLATELET
Basophils Absolute: 0 K/uL (ref 0.0–0.1)
Basophils Relative: 0 %
Eosinophils Absolute: 0.1 K/uL (ref 0.0–0.7)
Eosinophils Relative: 1 %
HCT: 29.5 % — ABNORMAL LOW (ref 36.0–46.0)
Hemoglobin: 10.5 g/dL — ABNORMAL LOW (ref 12.0–15.0)
Lymphocytes Relative: 23 %
Lymphs Abs: 2.2 K/uL (ref 0.7–4.0)
MCH: 30.3 pg (ref 26.0–34.0)
MCHC: 35.6 g/dL (ref 30.0–36.0)
MCV: 85 fL (ref 78.0–100.0)
Monocytes Absolute: 0.4 K/uL (ref 0.1–1.0)
Monocytes Relative: 4 %
Neutro Abs: 7.1 K/uL (ref 1.7–7.7)
Neutrophils Relative %: 72 %
Platelets: 296 K/uL (ref 150–400)
RBC: 3.47 MIL/uL — ABNORMAL LOW (ref 3.87–5.11)
RDW: 12.9 % (ref 11.5–15.5)
WBC: 9.7 K/uL (ref 4.0–10.5)

## 2016-02-08 LAB — COMPREHENSIVE METABOLIC PANEL WITH GFR
ALT: 15 U/L (ref 14–54)
AST: 19 U/L (ref 15–41)
Albumin: 3.2 g/dL — ABNORMAL LOW (ref 3.5–5.0)
Alkaline Phosphatase: 41 U/L (ref 38–126)
Anion gap: 9 (ref 5–15)
BUN: <5 mg/dL — ABNORMAL LOW (ref 6–20)
CO2: 21 mmol/L — ABNORMAL LOW (ref 22–32)
Calcium: 8.6 mg/dL — ABNORMAL LOW (ref 8.9–10.3)
Chloride: 105 mmol/L (ref 101–111)
Creatinine, Ser: 0.71 mg/dL (ref 0.44–1.00)
GFR calc Af Amer: >60 mL/min (ref >60)
GFR calc non Af Amer: >60 mL/min (ref >60)
Glucose, Bld: 86 mg/dL (ref 65–99)
Potassium: 3.4 mmol/L — ABNORMAL LOW (ref 3.5–5.1)
Sodium: 135 mmol/L (ref 135–145)
Total Bilirubin: 0.8 mg/dL (ref 0.3–1.2)
Total Protein: 6.1 g/dL — ABNORMAL LOW (ref 6.5–8.1)

## 2016-02-08 LAB — HCG, QUANTITATIVE, PREGNANCY: hCG, Beta Chain, Quant, S: 9487 m[IU]/mL — ABNORMAL HIGH (ref <5)

## 2016-02-08 MED ORDER — METOCLOPRAMIDE HCL 10 MG PO TABS: 10.0000 mg | ORAL_TABLET | Freq: Four times a day (QID) | ORAL | 0 refills | Status: DC | PRN

## 2016-02-08 MED ORDER — ACETAMINOPHEN 500 MG PO TABS
1000.0000 mg | ORAL_TABLET | Freq: Once | ORAL | Status: AC
  Administered 2016-02-08: 1000 mg via ORAL
  Filled 2016-02-08: qty 2

## 2016-02-08 MED ORDER — ONDANSETRON HCL 4 MG/2ML IJ SOLN
4.0000 mg | Freq: Once | INTRAMUSCULAR | Status: AC
  Administered 2016-02-08: 4 mg via INTRAVENOUS
  Filled 2016-02-08: qty 2

## 2016-02-08 MED ORDER — SODIUM CHLORIDE 0.9 % IV SOLN
Freq: Once | INTRAVENOUS | Status: AC
  Administered 2016-02-08: 04:00:00 via INTRAVENOUS

## 2016-02-08 NOTE — Progress Notes (Signed)
RROB called to [facility] with complaints of nausea, vomiting, and diarrhea that started Wednesday night. Patient is a G3P2 who is 24 and 1/7 days along in her pregnancy at this time; patient denies bleeding, LOF, or contractions at this time; patient states no problem with the pregnancy at this time; Dr Erin FullingHarraway-Smith notified about patient's arrival and complaints and NST orders given then patient can be OB cleared. EFM applied and assessing

## 2016-02-08 NOTE — ED Triage Notes (Addendum)
Patient reports emesis and diarrhea onset 2 days ago , denies fever or chills , she is [redacted] weeks pregnant G3P1, no abdominal contractions or vaginal discharge . Pt. added feeling lightheaded/dizzy this evening.

## 2016-02-08 NOTE — ED Notes (Signed)
Pt tolerating PO fluids

## 2016-02-08 NOTE — ED Provider Notes (Signed)
[facility]-EMERGENCY DEPT Provider Note   CSN: [id number] Arrival date & time: 02/08/16  0310   By signing my name below, I, Clovis PuAvnee Patel, attest that this documentation has been prepared under the direction and in the presence of Gilda Creasehristopher J Pollina, MD  Electronically Signed: Clovis PuAvnee Patel, ED Scribe. 02/08/16. 3:57 AM.   History   Chief Complaint Chief Complaint  Patient presents with  . Emesis    6 months pregnant  . Diarrhea   The history is provided by the patient. No language interpreter was used.   HPI Comments:  Brooke Lucas is a 30 y.o. female who presents to the Emergency Department complaining of sudden onset, persistent vomiting and diarrhea x 2 days. Pt also reports sudden onset, cramping abdominal pain. She notes sick contacts at home. Pt is [redacted] weeks pregnant. No alleviating factors noted. Pt denies any other associated symptoms and modifying factors at this time.    Past Medical History:  Diagnosis Date  . Anxiety   . Depression    after loss of son  . Sickle cell trait (HCC)   . Trichomonas     Patient Active Problem List   Diagnosis Date Noted  . Supervision of normal pregnancy, antepartum 10/28/2015  . Folliculitis 05/05/2013    Past Surgical History:  Procedure Laterality Date  . extraction wisdom      OB History    Gravida Para Term Preterm AB Living   3 2 2  0 0 1   SAB TAB Ectopic Multiple Live Births   0 0 0 0 1       Home Medications    Prior to Admission medications   Medication Sig Start Date End Date Taking? Authorizing Provider  Prenat-FeCbn-FeAspGl-FA-Omega (OB COMPLETE PETITE) 35-5-1-200 MG CAPS Take 1 capsule by mouth daily before breakfast. 12/18/15  Yes Brock Badharles A Harper, MD  clindamycin (CLEOCIN) 300 MG capsule Take 1 capsule (300 mg total) by mouth 3 (three) times daily. Patient not taking: Reported on 02/08/2016 11/05/15   Brock Badharles A Harper, MD  metoCLOPramide (REGLAN) 10 MG tablet Take 1 tablet (10 mg total) by mouth every  6 (six) hours as needed for nausea (nausea/headache). 02/08/16   Gilda Creasehristopher J Pollina, MD  ondansetron (ZOFRAN ODT) 4 MG disintegrating tablet Take 1 tablet (4 mg total) by mouth every 8 (eight) hours as needed for nausea. Patient not taking: Reported on 02/08/2016 12/17/15   Garlon HatchetLisa M Sanders, PA-C  promethazine (PHENERGAN) 25 MG tablet Take 1 tablet (25 mg total) by mouth every 6 (six) hours as needed for nausea. Patient not taking: Reported on 02/08/2016 10/30/15   Brock Badharles A Harper, MD    Family History Family History  Problem Relation Age of Onset  . Other Neg Hx     Social History Social History  Substance Use Topics  . Smoking status: Current Every Day Smoker    Packs/day: 0.25    Years: 0.00    Types: Cigarettes  . Smokeless tobacco: Never Used     Comment: 1/2 pack per day  . Alcohol use No     Comment: Socially     Allergies   Amoxicillin; Penicillins; and Benadryl [diphenhydramine]   Review of Systems Review of Systems  Constitutional: Negative for fever.  Gastrointestinal: Positive for diarrhea, nausea and vomiting.  All other systems reviewed and are negative.  Physical Exam Updated Vital Signs BP (!) 99/46   Pulse 78   Temp 98.7 F (37.1 C) (Oral)   Resp 16   Ht  5\' 7"  (1.702 m)   Wt 185 lb (83.9 kg)   LMP 08/23/2015 (Approximate)   SpO2 100%   BMI 28.98 kg/m   Physical Exam  Constitutional: She is oriented to person, place, and time. She appears well-developed and well-nourished. No distress.  HENT:  Head: Normocephalic and atraumatic.  Right Ear: Hearing normal.  Left Ear: Hearing normal.  Nose: Nose normal.  Mouth/Throat: Oropharynx is clear and moist and mucous membranes are normal.  Eyes: Conjunctivae and EOM are normal. Pupils are equal, round, and reactive to light.  Neck: Normal range of motion. Neck supple.  Cardiovascular: Regular rhythm, S1 normal and S2 normal.  Exam reveals no gallop and no friction rub.   No murmur  heard. Pulmonary/Chest: Effort normal and breath sounds normal. No respiratory distress. She exhibits no tenderness.  Abdominal: Soft. Normal appearance and bowel sounds are normal. There is no hepatosplenomegaly. There is no tenderness. There is no rebound, no guarding, no tenderness at McBurney's point and negative Murphy's sign. No hernia.  Musculoskeletal: Normal range of motion.  Neurological: She is alert and oriented to person, place, and time. She has normal strength. No cranial nerve deficit or sensory deficit. Coordination normal. GCS eye subscore is 4. GCS verbal subscore is 5. GCS motor subscore is 6.  Skin: Skin is warm, dry and intact. No rash noted. No cyanosis.  Psychiatric: She has a normal mood and affect. Her speech is normal and behavior is normal. Thought content normal.  Nursing note and vitals reviewed.    ED Treatments / Results  DIAGNOSTIC STUDIES:  Oxygen Saturation is 99% on RA, normal by my interpretation.    COORDINATION OF CARE:  3:56 AM Discussed treatment plan with pt at bedside and pt agreed to plan.  Labs (all labs ordered are listed, but only abnormal results are displayed) Labs Reviewed  CBC WITH DIFFERENTIAL/PLATELET - Abnormal; Notable for the following:       Result Value   RBC 3.47 (*)    Hemoglobin 10.5 (*)    HCT 29.5 (*)    All other components within normal limits  COMPREHENSIVE METABOLIC PANEL - Abnormal; Notable for the following:    Potassium 3.4 (*)    CO2 21 (*)    BUN <5 (*)    Calcium 8.6 (*)    Total Protein 6.1 (*)    Albumin 3.2 (*)    All other components within normal limits  HCG, QUANTITATIVE, PREGNANCY - Abnormal; Notable for the following:    hCG, Beta Chain, Quant, S 9,487 (*)    All other components within normal limits    EKG  EKG Interpretation None       Radiology No results found.  Procedures Procedures (including critical care time)  Medications Ordered in ED Medications  0.9 %  sodium chloride  infusion ( Intravenous Stopped 02/08/16 0518)  ondansetron (ZOFRAN) injection 4 mg (4 mg Intravenous Given 02/08/16 0343)  acetaminophen (TYLENOL) tablet 1,000 mg (1,000 mg Oral Given 02/08/16 0538)     Initial Impression / Assessment and Plan / ED Course  I have reviewed the triage vital signs and the nursing notes.  Pertinent labs & imaging results that were available during my care of the patient were reviewed by me and considered in my medical decision making (see chart for details).  Clinical Course    Patient history this [redacted] weeks pregnant resents to the emergency department with complaints of nausea, vomiting, diarrhea. Symptoms ongoing for 2 days. She has some  headache but no neck pain or stiffness. Has not had a fever. No upper respiratory infection symptoms. She has not had any vaginal bleeding or discharge. OB/GYN has cleared her from an obstetric standpoint. Patient feeling better after IV fluids. Will continue symptomatic treatment.  Final Clinical Impressions(s) / ED Diagnoses   Final diagnoses:  Gastroenteritis    New Prescriptions New Prescriptions   METOCLOPRAMIDE (REGLAN) 10 MG TABLET    Take 1 tablet (10 mg total) by mouth every 6 (six) hours as needed for nausea (nausea/headache).  I personally performed the services described in this documentation, which was scribed in my presence. The recorded information has been reviewed and is accurate.     Gilda Creasehristopher J Pollina, MD 02/08/16 [phone]

## 2016-02-08 NOTE — ED Notes (Signed)
Nurse will draw labs. 

## 2016-02-08 NOTE — ED Notes (Signed)
Pt given water to drink, c/o headache

## 2016-02-11 NOTE — L&D Delivery Note (Signed)
Delivery Note At 3:16 PM a viable female was delivered via  (Presentation: ROA).  APGAR:8, 9; weight pending.   Placenta status: Delivered intact with gentle traction. Cord: 3 vessels with the following complications: None.  Cord pH: not collected  Anesthesia: Epidural   Episiotomy:  None  Lacerations:  None Suture Repair: N/A Est. Blood Loss (mL):  150 ml  Mom to postpartum.  Baby to Couplet care / Skin to Skin.  [name], PGY-1 05/22/2016, 3:27 PM  OB FELLOW DELIVERY ATTESTATION  I was gloved and present for the delivery in its entirety, and I agree with the above resident's note.    Ernestina Penna, MD 3:37 PM

## 2016-02-12 ENCOUNTER — Encounter: Payer: Self-pay | Admitting: Obstetrics

## 2016-02-12 ENCOUNTER — Ambulatory Visit (INDEPENDENT_AMBULATORY_CARE_PROVIDER_SITE_OTHER): Payer: Medicaid Other | Admitting: Obstetrics

## 2016-02-12 VITALS — BP 148/77 | HR 105 | Wt 190.0 lb

## 2016-02-12 DIAGNOSIS — Z3480 Encounter for supervision of other normal pregnancy, unspecified trimester: Secondary | ICD-10-CM

## 2016-02-12 DIAGNOSIS — Z3490 Encounter for supervision of normal pregnancy, unspecified, unspecified trimester: Secondary | ICD-10-CM

## 2016-02-12 DIAGNOSIS — Z3482 Encounter for supervision of other normal pregnancy, second trimester: Secondary | ICD-10-CM

## 2016-02-12 NOTE — Progress Notes (Signed)
Subjective:    Brooke GottronJovona W Lucas is a 31 y.o. female being seen today for her obstetrical visit. She is at 1458w5d gestation. Patient reports: no complaints . Fetal movement: normal.  Problem List Items Addressed This Visit    Supervision of normal pregnancy, antepartum     Patient Active Problem List   Diagnosis Date Noted  . Supervision of normal pregnancy, antepartum 10/28/2015  . Folliculitis 05/05/2013   Objective:    BP (!) 148/77   Pulse (!) 105   Wt 190 lb (86.2 kg)   LMP 08/23/2015 (Approximate)   BMI 29.76 kg/m  FHT: 150 BPM  Uterine Size: size equals dates     Assessment:    Pregnancy @ 5058w5d    Plan:    OBGCT: ordered for next visit. Signs and symptoms of preterm labor: discussed.  Labs, problem list reviewed and updated 2 hr GTT planned Follow up in 4 weeks.  Patient ID: Brooke Lucas, female   DOB: Jul 11, 1985, 31 y.o.   MRN: [id number]

## 2016-02-15 ENCOUNTER — Encounter (HOSPITAL_COMMUNITY): Payer: Self-pay

## 2016-02-27 ENCOUNTER — Ambulatory Visit (HOSPITAL_COMMUNITY)
Admission: RE | Admit: 2016-02-27 | Discharge: 2016-02-27 | Disposition: A | Discharge status: Home / Self Care | Payer: Medicaid Other | Source: Ambulatory Visit | Attending: Obstetrics | Admitting: Obstetrics

## 2016-03-06 ENCOUNTER — Ambulatory Visit (HOSPITAL_COMMUNITY)
Admission: RE | Admit: 2016-03-06 | Discharge: 2016-03-06 | Disposition: A | Discharge status: Home / Self Care | Payer: Medicaid Other | Source: Ambulatory Visit | Attending: Obstetrics | Admitting: Obstetrics

## 2016-03-06 ENCOUNTER — Encounter (HOSPITAL_COMMUNITY): Payer: Self-pay

## 2016-03-06 DIAGNOSIS — O09293 Supervision of pregnancy with other poor reproductive or obstetric history, third trimester: Secondary | ICD-10-CM | POA: Diagnosis not present

## 2016-03-06 DIAGNOSIS — O09299 Supervision of pregnancy with other poor reproductive or obstetric history, unspecified trimester: Secondary | ICD-10-CM

## 2016-03-06 DIAGNOSIS — F17200 Nicotine dependence, unspecified, uncomplicated: Secondary | ICD-10-CM | POA: Insufficient documentation

## 2016-03-06 DIAGNOSIS — Z3A28 28 weeks gestation of pregnancy: Secondary | ICD-10-CM | POA: Insufficient documentation

## 2016-03-06 DIAGNOSIS — O99333 Smoking (tobacco) complicating pregnancy, third trimester: Secondary | ICD-10-CM | POA: Diagnosis not present

## 2016-03-06 DIAGNOSIS — Z3490 Encounter for supervision of normal pregnancy, unspecified, unspecified trimester: Secondary | ICD-10-CM

## 2016-03-07 ENCOUNTER — Other Ambulatory Visit (HOSPITAL_COMMUNITY): Payer: Self-pay | Admitting: *Deleted

## 2016-03-07 DIAGNOSIS — O09293 Supervision of pregnancy with other poor reproductive or obstetric history, third trimester: Secondary | ICD-10-CM

## 2016-03-11 ENCOUNTER — Ambulatory Visit (INDEPENDENT_AMBULATORY_CARE_PROVIDER_SITE_OTHER): Payer: Medicaid Other | Admitting: Obstetrics

## 2016-03-11 ENCOUNTER — Encounter: Payer: Self-pay | Admitting: Obstetrics

## 2016-03-11 ENCOUNTER — Telehealth: Payer: Self-pay | Admitting: *Deleted

## 2016-03-11 ENCOUNTER — Other Ambulatory Visit: Payer: Medicaid Other

## 2016-03-11 DIAGNOSIS — Z3493 Encounter for supervision of normal pregnancy, unspecified, third trimester: Secondary | ICD-10-CM

## 2016-03-11 DIAGNOSIS — Z3490 Encounter for supervision of normal pregnancy, unspecified, unspecified trimester: Secondary | ICD-10-CM

## 2016-03-11 NOTE — Telephone Encounter (Signed)
error 

## 2016-03-11 NOTE — Telephone Encounter (Signed)
Pt came this am and Dr Clearance CootsHarper told her if her heartburn got any worse that he would call her something in to the pharmacy.... Please advise.Marland Kitchen.Marland Kitchen..Marland Kitchen

## 2016-03-11 NOTE — Progress Notes (Signed)
Subjective:  Brooke Lucas is a 31 y.o. G3P2001 at 7237w5d being seen today for ongoing prenatal care.  She is currently monitored for the following issues for this low-risk pregnancy and has Folliculitis and Supervision of normal pregnancy, antepartum on her problem list.  Patient reports no complaints.  Contractions: Not present. Vag. Bleeding: None.  Movement: Present. Denies leaking of fluid.   The following portions of the patient's history were reviewed and updated as appropriate: allergies, current medications, past family history, past medical history, past social history, past surgical history and problem list. Problem list updated.  Objective:   Vitals:   03/11/16 0854  BP: (!) 111/56  Pulse: 96  Temp: 97 F (36.1 C)  Weight: 194 lb 12.8 oz (88.4 kg)    Fetal Status: Fetal Heart Rate (bpm): 150   Movement: Present     General:  Alert, oriented and cooperative. Patient is in no acute distress.  Skin: Skin is warm and dry. No rash noted.   Cardiovascular: Normal heart rate noted  Respiratory: Normal respiratory effort, no problems with respiration noted  Abdomen: Soft, gravid, appropriate for gestational age. Pain/Pressure: Present     Pelvic:  Cervical exam deferred        Extremities: Normal range of motion.  Edema: Trace  Mental Status: Normal mood and affect. Normal behavior. Normal judgment and thought content.   Urinalysis: Urine Protein: Negative Urine Glucose: Negative  Assessment and Plan:  Pregnancy: G3P2001 at 2437w5d  1. Encounter for supervision of normal pregnancy, antepartum, unspecified gravidity Labs today: - Glucose Tolerance, 2 Hours w/1 Hour - CBC - HIV antibody - RPR  Preterm labor symptoms and general obstetric precautions including but not limited to vaginal bleeding, contractions, leaking of fluid and fetal movement were reviewed in detail with the patient. Please refer to After Visit Summary for other counseling recommendations.  Return in  about 2 weeks (around 03/25/2016).   Brock Badharles A Jakob Kimberlin, MD

## 2016-03-12 LAB — RPR: RPR: NONREACTIVE

## 2016-03-12 LAB — GLUCOSE TOLERANCE, 2 HOURS W/ 1HR
GLUCOSE, 2 HOUR: 114 mg/dL (ref 65–152)
Glucose, 1 hour: 139 mg/dL (ref 65–179)
Glucose, Fasting: 81 mg/dL (ref 65–91)

## 2016-03-12 LAB — CBC
HEMATOCRIT: 31.2 % — AB (ref 34.0–46.6)
Hemoglobin: 10.2 g/dL — ABNORMAL LOW (ref 11.1–15.9)
MCH: 29.2 pg (ref 26.6–33.0)
MCHC: 32.7 g/dL (ref 31.5–35.7)
MCV: 89 fL (ref 79–97)
Platelets: 289 10*3/uL (ref 150–379)
RBC: 3.49 x10E6/uL — ABNORMAL LOW (ref 3.77–5.28)
RDW: 14.4 % (ref 12.3–15.4)
WBC: 8.3 10*3/uL (ref 3.4–10.8)

## 2016-03-12 LAB — HIV ANTIBODY (ROUTINE TESTING W REFLEX): HIV Screen 4th Generation wRfx: NONREACTIVE

## 2016-03-13 NOTE — Telephone Encounter (Signed)
Please advise or sent Rx.

## 2016-03-14 ENCOUNTER — Other Ambulatory Visit: Payer: Self-pay | Admitting: Obstetrics

## 2016-03-14 DIAGNOSIS — O26899 Other specified pregnancy related conditions, unspecified trimester: Secondary | ICD-10-CM

## 2016-03-14 DIAGNOSIS — R12 Heartburn: Principal | ICD-10-CM

## 2016-03-14 MED ORDER — RANITIDINE HCL 150 MG PO TABS: 150.0000 mg | ORAL_TABLET | Freq: Two times a day (BID) | ORAL | 5 refills | Status: DC

## 2016-03-14 NOTE — Telephone Encounter (Signed)
Zantac Rx 

## 2016-03-17 NOTE — Telephone Encounter (Signed)
Pt.notified

## 2016-03-25 ENCOUNTER — Encounter: Payer: Self-pay | Admitting: Obstetrics & Gynecology

## 2016-03-25 ENCOUNTER — Ambulatory Visit (INDEPENDENT_AMBULATORY_CARE_PROVIDER_SITE_OTHER): Payer: Medicaid Other | Admitting: Obstetrics & Gynecology

## 2016-03-25 VITALS — BP 120/70 | HR 110 | Wt 191.6 lb

## 2016-03-25 DIAGNOSIS — O0990 Supervision of high risk pregnancy, unspecified, unspecified trimester: Secondary | ICD-10-CM

## 2016-03-25 DIAGNOSIS — O09299 Supervision of pregnancy with other poor reproductive or obstetric history, unspecified trimester: Secondary | ICD-10-CM | POA: Insufficient documentation

## 2016-03-25 DIAGNOSIS — O09293 Supervision of pregnancy with other poor reproductive or obstetric history, third trimester: Secondary | ICD-10-CM

## 2016-03-25 NOTE — Progress Notes (Signed)
   PRENATAL VISIT NOTE  Subjective:  Brooke Lucas is a 31 y.o. G3P2001 at 755w5d being seen today for ongoing prenatal care.  She is currently monitored for the following issues for this high-risk pregnancy and has Folliculitis; Supervision of high risk pregnancy, antepartum; and Prior pregnancy with fetal demise on her problem list.  Patient reports no complaints.  Contractions: Not present. Vag. Bleeding: None.  Movement: Present. Denies leaking of fluid.   The following portions of the patient's history were reviewed and updated as appropriate: allergies, current medications, past family history, past medical history, past social history, past surgical history and problem list. Problem list updated.  Objective:   Vitals:   03/25/16 1056  BP: 120/70  Pulse: (!) 110  Weight: 191 lb 9.6 oz (86.9 kg)    Fetal Status: Fetal Heart Rate (bpm): 140 Fundal Height: 32 cm Movement: Present     General:  Alert, oriented and cooperative. Patient is in no acute distress.  Skin: Skin is warm and dry. No rash noted.   Cardiovascular: Normal heart rate noted  Respiratory: Normal respiratory effort, no problems with respiration noted  Abdomen: Soft, gravid, appropriate for gestational age. Pain/Pressure: Present     Pelvic:  Cervical exam deferred        Extremities: Normal range of motion.  Edema: None  Mental Status: Normal mood and affect. Normal behavior. Normal judgment and thought content.   Assessment and Plan:  Pregnancy: G3P2001 at 785w5d  1. Supervision of high risk pregnancy, antepartum Normal fetal anatomy  2. Prior pregnancy with fetal demise Start fetal testing 32 weeks  Preterm labor symptoms and general obstetric precautions including but not limited to vaginal bleeding, contractions, leaking of fluid and fetal movement were reviewed in detail with the patient. Please refer to After Visit Summary for other counseling recommendations.  Return in about 30 years (around  03/25/2025) for nst and visit.   Adam PhenixJames G Ahana Najera, MD

## 2016-03-25 NOTE — Patient Instructions (Signed)
Third Trimester of Pregnancy The third trimester is from week 29 through week 40 (months 7 through 9). The third trimester is a time when the unborn baby (fetus) is growing rapidly. At the end of the ninth month, the fetus is about 20 inches in length and weighs 6-10 pounds. Body changes during your third trimester Your body goes through many changes during pregnancy. The changes vary from woman to woman. During the third trimester:  Your weight will continue to increase. You can expect to gain 25-35 pounds (11-16 kg) by the end of the pregnancy.  You may begin to get stretch marks on your hips, abdomen, and breasts.  You may urinate more often because the fetus is moving lower into your pelvis and pressing on your bladder.  You may develop or continue to have heartburn. This is caused by increased hormones that slow down muscles in the digestive tract.  You may develop or continue to have constipation because increased hormones slow digestion and cause the muscles that push waste through your intestines to relax.  You may develop hemorrhoids. These are swollen veins (varicose veins) in the rectum that can itch or be painful.  You may develop swollen, bulging veins (varicose veins) in your legs.  You may have increased body aches in the pelvis, back, or thighs. This is due to weight gain and increased hormones that are relaxing your joints.  You may have changes in your hair. These can include thickening of your hair, rapid growth, and changes in texture. Some women also have hair loss during or after pregnancy, or hair that feels dry or thin. Your hair will most likely return to normal after your baby is born.  Your breasts will continue to grow and they will continue to become tender. A yellow fluid (colostrum) may leak from your breasts. This is the first milk you are producing for your baby.  Your belly button may stick out.  You may notice more swelling in your hands, face, or  ankles.  You may have increased tingling or numbness in your hands, arms, and legs. The skin on your belly may also feel numb.  You may feel short of breath because of your expanding uterus.  You may have more problems sleeping. This can be caused by the size of your belly, increased need to urinate, and an increase in your body's metabolism.  You may notice the fetus "dropping," or moving lower in your abdomen.  You may have increased vaginal discharge.  Your cervix becomes thin and soft (effaced) near your due date. What to expect at prenatal visits You will have prenatal exams every 2 weeks until week 36. Then you will have weekly prenatal exams. During a routine prenatal visit:  You will be weighed to make sure you and the fetus are growing normally.  Your blood pressure will be taken.  Your abdomen will be measured to track your baby's growth.  The fetal heartbeat will be listened to.  Any test results from the previous visit will be discussed.  You may have a cervical check near your due date to see if you have effaced. At around 36 weeks, your health care provider will check your cervix. At the same time, your health care provider will also perform a test on the secretions of the vaginal tissue. This test is to determine if a type of bacteria, Group B streptococcus, is present. Your health care provider will explain this further. Your health care provider may ask you:    What your birth plan is.  How you are feeling.  If you are feeling the baby move.  If you have had any abnormal symptoms, such as leaking fluid, bleeding, severe headaches, or abdominal cramping.  If you are using any tobacco products, including cigarettes, chewing tobacco, and electronic cigarettes.  If you have any questions. Other tests or screenings that may be performed during your third trimester include:  Blood tests that check for low iron levels (anemia).  Fetal testing to check the health,  activity level, and growth of the fetus. Testing is done if you have certain medical conditions or if there are problems during the pregnancy.  Nonstress test (NST). This test checks the health of your baby to make sure there are no signs of problems, such as the baby not getting enough oxygen. During this test, a belt is placed around your belly. The baby is made to move, and its heart rate is monitored during movement. What is false labor? False labor is a condition in which you feel small, irregular tightenings of the muscles in the womb (contractions) that eventually go away. These are called Braxton Hicks contractions. Contractions may last for hours, days, or even weeks before true labor sets in. If contractions come at regular intervals, become more frequent, increase in intensity, or become painful, you should see your health care provider. What are the signs of labor?  Abdominal cramps.  Regular contractions that start at 10 minutes apart and become stronger and more frequent with time.  Contractions that start on the top of the uterus and spread down to the lower abdomen and back.  Increased pelvic pressure and dull back pain.  A watery or bloody mucus discharge that comes from the vagina.  Leaking of amniotic fluid. This is also known as your "water breaking." It could be a slow trickle or a gush. Let your doctor know if it has a color or strange odor. If you have any of these signs, call your health care provider right away, even if it is before your due date. Follow these instructions at home: Eating and drinking  Continue to eat regular, healthy meals.  Do not eat:  Raw meat or meat spreads.  Unpasteurized milk or cheese.  Unpasteurized juice.  Store-made salad.  Refrigerated smoked seafood.  Hot dogs or deli meat, unless they are piping hot.  More than 6 ounces of albacore tuna a week.  Shark, swordfish, king mackerel, or tile fish.  Store-made salads.  Raw  sprouts, such as mung bean or alfalfa sprouts.  Take prenatal vitamins as told by your health care provider.  Take 1000 mg of calcium daily as told by your health care provider.  If you develop constipation:  Take over-the-counter or prescription medicines.  Drink enough fluid to keep your urine clear or pale yellow.  Eat foods that are high in fiber, such as fresh fruits and vegetables, whole grains, and beans.  Limit foods that are high in fat and processed sugars, such as fried and sweet foods. Activity  Exercise only as directed by your health care provider. Healthy pregnant women should aim for 2 hours and 30 minutes of moderate exercise per week. If you experience any pain or discomfort while exercising, stop.  Avoid heavy lifting.  Do not exercise in extreme heat or humidity, or at high altitudes.  Wear low-heel, comfortable shoes.  Practice good posture.  Do not travel far distances unless it is absolutely necessary and only with the approval   of your health care provider.  Wear your seat belt at all times while in a car, on a bus, or on a plane.  Take frequent breaks and rest with your legs elevated if you have leg cramps or low back pain.  Do not use hot tubs, steam rooms, or saunas.  You may continue to have sex unless your health care provider tells you otherwise. Lifestyle  Do not use any products that contain nicotine or tobacco, such as cigarettes and e-cigarettes. If you need help quitting, ask your health care provider.  Do not drink alcohol.  Do not use any medicinal herbs or unprescribed drugs. These chemicals affect the formation and growth of the baby.  If you develop varicose veins:  Wear support pantyhose or compression stockings as told by your healthcare provider.  Elevate your feet for 15 minutes, 3-4 times a day.  Wear a supportive maternity bra to help with breast tenderness. General instructions  Take over-the-counter and prescription  medicines only as told by your health care provider. There are medicines that are either safe or unsafe to take during pregnancy.  Take warm sitz baths to soothe any pain or discomfort caused by hemorrhoids. Use hemorrhoid cream or witch hazel if your health care provider approves.  Avoid cat litter boxes and soil used by cats. These carry germs that can cause birth defects in the baby. If you have a cat, ask someone to clean the litter box for you.  To prepare for the arrival of your baby:  Take prenatal classes to understand, practice, and ask questions about the labor and delivery.  Make a trial run to the hospital.  Visit the hospital and tour the maternity area.  Arrange for maternity or paternity leave through employers.  Arrange for family and friends to take care of pets while you are in the hospital.  Purchase a rear-facing car seat and make sure you know how to install it in your car.  Pack your hospital bag.  Prepare the baby's nursery. Make sure to remove all pillows and stuffed animals from the baby's crib to prevent suffocation.  Visit your dentist if you have not gone during your pregnancy. Use a soft toothbrush to brush your teeth and be gentle when you floss.  Keep all prenatal follow-up visits as told by your health care provider. This is important. Contact a health care provider if:  You are unsure if you are in labor or if your water has broken.  You become dizzy.  You have mild pelvic cramps, pelvic pressure, or nagging pain in your abdominal area.  You have lower back pain.  You have persistent nausea, vomiting, or diarrhea.  You have an unusual or bad smelling vaginal discharge.  You have pain when you urinate. Get help right away if:  You have a fever.  You are leaking fluid from your vagina.  You have spotting or bleeding from your vagina.  You have severe abdominal pain or cramping.  You have rapid weight loss or weight gain.  You have  shortness of breath with chest pain.  You notice sudden or extreme swelling of your face, hands, ankles, feet, or legs.  Your baby makes fewer than 10 movements in 2 hours.  You have severe headaches that do not go away with medicine.  You have vision changes. Summary  The third trimester is from week 29 through week 40, months 7 through 9. The third trimester is a time when the unborn baby (fetus)   is growing rapidly.  During the third trimester, your discomfort may increase as you and your baby continue to gain weight. You may have abdominal, leg, and back pain, sleeping problems, and an increased need to urinate.  During the third trimester your breasts will keep growing and they will continue to become tender. A yellow fluid (colostrum) may leak from your breasts. This is the first milk you are producing for your baby.  False labor is a condition in which you feel small, irregular tightenings of the muscles in the womb (contractions) that eventually go away. These are called Braxton Hicks contractions. Contractions may last for hours, days, or even weeks before true labor sets in.  Signs of labor can include: abdominal cramps; regular contractions that start at 10 minutes apart and become stronger and more frequent with time; watery or bloody mucus discharge that comes from the vagina; increased pelvic pressure and dull back pain; and leaking of amniotic fluid. This information is not intended to replace advice given to you by your health care provider. Make sure you discuss any questions you have with your health care provider. Document Released: 01/21/2001 Document Revised: 07/05/2015 Document Reviewed: 03/30/2012 [employer] Interactive Patient Education  2017 [employer]  

## 2016-04-03 ENCOUNTER — Ambulatory Visit (INDEPENDENT_AMBULATORY_CARE_PROVIDER_SITE_OTHER): Payer: Medicaid Other | Admitting: Obstetrics & Gynecology

## 2016-04-03 VITALS — BP 118/69 | HR 93 | Wt 189.0 lb

## 2016-04-03 DIAGNOSIS — Z23 Encounter for immunization: Secondary | ICD-10-CM

## 2016-04-03 DIAGNOSIS — O09293 Supervision of pregnancy with other poor reproductive or obstetric history, third trimester: Secondary | ICD-10-CM

## 2016-04-03 DIAGNOSIS — O09299 Supervision of pregnancy with other poor reproductive or obstetric history, unspecified trimester: Secondary | ICD-10-CM

## 2016-04-03 DIAGNOSIS — O0990 Supervision of high risk pregnancy, unspecified, unspecified trimester: Secondary | ICD-10-CM

## 2016-04-03 NOTE — Progress Notes (Signed)
   PRENATAL VISIT NOTE  Subjective:  Brooke Lucas is a 31 y.o. G3P2001 at 4258w0d being seen today for ongoing prenatal care.  She is currently monitored for the following issues for this high-risk pregnancy and has Folliculitis; Supervision of high risk pregnancy, antepartum; and Prior pregnancy with fetal demise on her problem list.  Patient reports no complaints.  Contractions: Regular. Vag. Bleeding: None.  Movement: Present. Denies leaking of fluid.   The following portions of the patient's history were reviewed and updated as appropriate: allergies, current medications, past family history, past medical history, past social history, past surgical history and problem list. Problem list updated.  Objective:   Vitals:   04/03/16 1108  BP: 118/69  Pulse: 93  Weight: 189 lb (85.7 kg)    Fetal Status: Fetal Heart Rate (bpm): 140   Movement: Present     General:  Alert, oriented and cooperative. Patient is in no acute distress.  Skin: Skin is warm and dry. No rash noted.   Cardiovascular: Normal heart rate noted  Respiratory: Normal respiratory effort, no problems with respiration noted  Abdomen: Soft, gravid, appropriate for gestational age. Pain/Pressure: Present     Pelvic:  Cervical exam deferred        Extremities: Normal range of motion.  Edema: None  Mental Status: Normal mood and affect. Normal behavior. Normal judgment and thought content.   NST performed today was reviewed and was found to be reactive.  Continue recommended antenatal testing and prenatal care.   Assessment and Plan:  Pregnancy: G3P2001 at 7458w0d  1. Prior pregnancy with fetal demise Will get weekly BPP at [facility] and weekly OB visit/NST here.  - US [facility] FETAL BPP WO NON STRESS; Future Growth scan scheduled 04/17/16.  2. Supervision of high risk pregnancy, antepartum - Tdap vaccine greater than or equal to 7yo IM given today.  Preterm labor symptoms and general obstetric precautions including but not  limited to vaginal bleeding, contractions, leaking of fluid and fetal movement were reviewed in detail with the patient. Please refer to After Visit Summary for other counseling recommendations.  Return in about 1 week (around 04/10/2016) for OB Visit and NST (needs this every Thursday).   Tereso NewcomerUgonna A Theo Krumholz, MD

## 2016-04-03 NOTE — Patient Instructions (Signed)
Return to clinic for any scheduled appointments or obstetric concerns, or go to [facility] for evaluation  

## 2016-04-07 ENCOUNTER — Ambulatory Visit (HOSPITAL_COMMUNITY)
Admission: RE | Admit: 2016-04-07 | Discharge: 2016-04-07 | Disposition: A | Discharge status: Home / Self Care | Payer: Medicaid Other | Source: Ambulatory Visit | Attending: Obstetrics & Gynecology | Admitting: Obstetrics & Gynecology

## 2016-04-07 ENCOUNTER — Other Ambulatory Visit: Payer: Medicaid Other

## 2016-04-07 DIAGNOSIS — Z3A32 32 weeks gestation of pregnancy: Secondary | ICD-10-CM | POA: Diagnosis not present

## 2016-04-07 DIAGNOSIS — O352XX0 Maternal care for (suspected) hereditary disease in fetus, not applicable or unspecified: Secondary | ICD-10-CM | POA: Insufficient documentation

## 2016-04-07 DIAGNOSIS — O99333 Smoking (tobacco) complicating pregnancy, third trimester: Secondary | ICD-10-CM | POA: Insufficient documentation

## 2016-04-07 DIAGNOSIS — O09299 Supervision of pregnancy with other poor reproductive or obstetric history, unspecified trimester: Secondary | ICD-10-CM

## 2016-04-07 DIAGNOSIS — O09293 Supervision of pregnancy with other poor reproductive or obstetric history, third trimester: Secondary | ICD-10-CM | POA: Diagnosis present

## 2016-04-07 NOTE — Addendum Note (Signed)
Addended by: Dalphine HandingGARDNER, Zavior Thomason L on: 04/07/2016 11:31 AM   Modules accepted: Orders

## 2016-04-08 ENCOUNTER — Other Ambulatory Visit: Payer: Self-pay | Admitting: *Deleted

## 2016-04-08 DIAGNOSIS — Z3493 Encounter for supervision of normal pregnancy, unspecified, third trimester: Secondary | ICD-10-CM

## 2016-04-08 NOTE — Progress Notes (Signed)
BPP ordered weekly as per Dr Macon LargeAnyanwu.

## 2016-04-10 ENCOUNTER — Ambulatory Visit (INDEPENDENT_AMBULATORY_CARE_PROVIDER_SITE_OTHER): Payer: Medicaid Other | Admitting: Obstetrics and Gynecology

## 2016-04-10 VITALS — BP 110/71 | HR 101 | Wt 187.0 lb

## 2016-04-10 DIAGNOSIS — O09299 Supervision of pregnancy with other poor reproductive or obstetric history, unspecified trimester: Secondary | ICD-10-CM

## 2016-04-10 DIAGNOSIS — O0990 Supervision of high risk pregnancy, unspecified, unspecified trimester: Secondary | ICD-10-CM

## 2016-04-10 DIAGNOSIS — O09293 Supervision of pregnancy with other poor reproductive or obstetric history, third trimester: Secondary | ICD-10-CM

## 2016-04-10 NOTE — Patient Instructions (Signed)
Third Trimester of Pregnancy The third trimester is from week 28 through week 40 (months 7 through 9). The third trimester is a time when the unborn baby (fetus) is growing rapidly. At the end of the ninth month, the fetus is about 20 inches in length and weighs 6-10 pounds. Body changes during your third trimester Your body will continue to go through many changes during pregnancy. The changes vary from woman to woman. During the third trimester:  Your weight will continue to increase. You can expect to gain 25-35 pounds (11-16 kg) by the end of the pregnancy.  You may begin to get stretch marks on your hips, abdomen, and breasts.  You may urinate more often because the fetus is moving lower into your pelvis and pressing on your bladder.  You may develop or continue to have heartburn. This is caused by increased hormones that slow down muscles in the digestive tract.  You may develop or continue to have constipation because increased hormones slow digestion and cause the muscles that push waste through your intestines to relax.  You may develop hemorrhoids. These are swollen veins (varicose veins) in the rectum that can itch or be painful.  You may develop swollen, bulging veins (varicose veins) in your legs.  You may have increased body aches in the pelvis, back, or thighs. This is due to weight gain and increased hormones that are relaxing your joints.  You may have changes in your hair. These can include thickening of your hair, rapid growth, and changes in texture. Some women also have hair loss during or after pregnancy, or hair that feels dry or thin. Your hair will most likely return to normal after your baby is born.  Your breasts will continue to grow and they will continue to become tender. A yellow fluid (colostrum) may leak from your breasts. This is the first milk you are producing for your baby.  Your belly button may stick out.  You may notice more swelling in your hands,  face, or ankles.  You may have increased tingling or numbness in your hands, arms, and legs. The skin on your belly may also feel numb.  You may feel short of breath because of your expanding uterus.  You may have more problems sleeping. This can be caused by the size of your belly, increased need to urinate, and an increase in your body's metabolism.  You may notice the fetus "dropping," or moving lower in your abdomen (lightening).  You may have increased vaginal discharge.  You may notice your joints feel loose and you may have pain around your pelvic bone.  What to expect at prenatal visits You will have prenatal exams every 2 weeks until week 36. Then you will have weekly prenatal exams. During a routine prenatal visit:  You will be weighed to make sure you and the baby are growing normally.  Your blood pressure will be taken.  Your abdomen will be measured to track your baby's growth.  The fetal heartbeat will be listened to.  Any test results from the previous visit will be discussed.  You may have a cervical check near your due date to see if your cervix has softened or thinned (effaced).  You will be tested for Group B streptococcus. This happens between 35 and 37 weeks.  Your health care provider may ask you:  What your birth plan is.  How you are feeling.  If you are feeling the baby move.  If you have had   any abnormal symptoms, such as leaking fluid, bleeding, severe headaches, or abdominal cramping.  If you are using any tobacco products, including cigarettes, chewing tobacco, and electronic cigarettes.  If you have any questions.  Other tests or screenings that may be performed during your third trimester include:  Blood tests that check for low iron levels (anemia).  Fetal testing to check the health, activity level, and growth of the fetus. Testing is done if you have certain medical conditions or if there are problems during the  pregnancy.  Nonstress test (NST). This test checks the health of your baby to make sure there are no signs of problems, such as the baby not getting enough oxygen. During this test, a belt is placed around your belly. The baby is made to move, and its heart rate is monitored during movement.  What is false labor? False labor is a condition in which you feel small, irregular tightenings of the muscles in the womb (contractions) that usually go away with rest, changing position, or drinking water. These are called Braxton Hicks contractions. Contractions may last for hours, days, or even weeks before true labor sets in. If contractions come at regular intervals, become more frequent, increase in intensity, or become painful, you should see your health care provider. What are the signs of labor?  Abdominal cramps.  Regular contractions that start at 10 minutes apart and become stronger and more frequent with time.  Contractions that start on the top of the uterus and spread down to the lower abdomen and back.  Increased pelvic pressure and dull back pain.  A watery or bloody mucus discharge that comes from the vagina.  Leaking of amniotic fluid. This is also known as your "water breaking." It could be a slow trickle or a gush. Let your health care provider know if it has a color or strange odor. If you have any of these signs, call your health care provider right away, even if it is before your due date. Follow these instructions at home: Medicines  Follow your health care provider's instructions regarding medicine use. Specific medicines may be either safe or unsafe to take during pregnancy.  Take a prenatal vitamin that contains at least 600 micrograms (mcg) of folic acid.  If you develop constipation, try taking a stool softener if your health care provider approves. Eating and drinking  Eat a balanced diet that includes fresh fruits and vegetables, whole grains, good sources of protein  such as meat, eggs, or tofu, and low-fat dairy. Your health care provider will help you determine the amount of weight gain that is right for you.  Avoid raw meat and uncooked cheese. These carry germs that can cause birth defects in the baby.  If you have low calcium intake from food, talk to your health care provider about whether you should take a daily calcium supplement.  Eat four or five small meals rather than three large meals a day.  Limit foods that are high in fat and processed sugars, such as fried and sweet foods.  To prevent constipation: ? Drink enough fluid to keep your urine clear or pale yellow. ? Eat foods that are high in fiber, such as fresh fruits and vegetables, whole grains, and beans. Activity  Exercise only as directed by your health care provider. Most women can continue their usual exercise routine during pregnancy. Try to exercise for 30 minutes at least 5 days a week. Stop exercising if you experience uterine contractions.  Avoid heavy   lifting.  Do not exercise in extreme heat or humidity, or at high altitudes.  Wear low-heel, comfortable shoes.  Practice good posture.  You may continue to have sex unless your health care provider tells you otherwise. Relieving pain and discomfort  Take frequent breaks and rest with your legs elevated if you have leg cramps or low back pain.  Take warm sitz baths to soothe any pain or discomfort caused by hemorrhoids. Use hemorrhoid cream if your health care provider approves.  Wear a good support bra to prevent discomfort from breast tenderness.  If you develop varicose veins: ? Wear support pantyhose or compression stockings as told by your healthcare provider. ? Elevate your feet for 15 minutes, 3-4 times a day. Prenatal care  Write down your questions. Take them to your prenatal visits.  Keep all your prenatal visits as told by your health care provider. This is important. Safety  Wear your seat belt at  all times when driving.  Make a list of emergency phone numbers, including numbers for family, friends, the hospital, and police and fire departments. General instructions  Avoid cat litter boxes and soil used by cats. These carry germs that can cause birth defects in the baby. If you have a cat, ask someone to clean the litter box for you.  Do not travel far distances unless it is absolutely necessary and only with the approval of your health care provider.  Do not use hot tubs, steam rooms, or saunas.  Do not drink alcohol.  Do not use any products that contain nicotine or tobacco, such as cigarettes and e-cigarettes. If you need help quitting, ask your health care provider.  Do not use any medicinal herbs or unprescribed drugs. These chemicals affect the formation and growth of the baby.  Do not douche or use tampons or scented sanitary pads.  Do not cross your legs for long periods of time.  To prepare for the arrival of your baby: ? Take prenatal classes to understand, practice, and ask questions about labor and delivery. ? Make a trial run to the hospital. ? Visit the hospital and tour the maternity area. ? Arrange for maternity or paternity leave through employers. ? Arrange for family and friends to take care of pets while you are in the hospital. ? Purchase a rear-facing car seat and make sure you know how to install it in your car. ? Pack your hospital bag. ? Prepare the baby's nursery. Make sure to remove all pillows and stuffed animals from the baby's crib to prevent suffocation.  Visit your dentist if you have not gone during your pregnancy. Use a soft toothbrush to brush your teeth and be gentle when you floss. Contact a health care provider if:  You are unsure if you are in labor or if your water has broken.  You become dizzy.  You have mild pelvic cramps, pelvic pressure, or nagging pain in your abdominal area.  You have lower back pain.  You have persistent  nausea, vomiting, or diarrhea.  You have an unusual or bad smelling vaginal discharge.  You have pain when you urinate. Get help right away if:  Your water breaks before 37 weeks.  You have regular contractions less than 5 minutes apart before 37 weeks.  You have a fever.  You are leaking fluid from your vagina.  You have spotting or bleeding from your vagina.  You have severe abdominal pain or cramping.  You have rapid weight loss or weight gain.    You have shortness of breath with chest pain.  You notice sudden or extreme swelling of your face, hands, ankles, feet, or legs.  Your baby makes fewer than 10 movements in 2 hours.  You have severe headaches that do not go away when you take medicine.  You have vision changes. Summary  The third trimester is from week 28 through week 40, months 7 through 9. The third trimester is a time when the unborn baby (fetus) is growing rapidly.  During the third trimester, your discomfort may increase as you and your baby continue to gain weight. You may have abdominal, leg, and back pain, sleeping problems, and an increased need to urinate.  During the third trimester your breasts will keep growing and they will continue to become tender. A yellow fluid (colostrum) may leak from your breasts. This is the first milk you are producing for your baby.  False labor is a condition in which you feel small, irregular tightenings of the muscles in the womb (contractions) that eventually go away. These are called Braxton Hicks contractions. Contractions may last for hours, days, or even weeks before true labor sets in.  Signs of labor can include: abdominal cramps; regular contractions that start at 10 minutes apart and become stronger and more frequent with time; watery or bloody mucus discharge that comes from the vagina; increased pelvic pressure and dull back pain; and leaking of amniotic fluid. This information is not intended to replace advice  given to you by your health care provider. Make sure you discuss any questions you have with your health care provider. Document Released: 01/21/2001 Document Revised: 07/05/2015 Document Reviewed: 03/30/2012 Elsevier Interactive Patient Education  2017 [employer]  

## 2016-04-10 NOTE — Progress Notes (Signed)
Subjective:  Brooke Lucas is a 31 y.o. G3P2001 at 8142w0d being seen today for ongoing prenatal care.  She is currently monitored for the following issues for this high-risk pregnancy and has Supervision of high risk pregnancy, antepartum and Prior pregnancy with fetal demise on her problem list.  Patient reports no complaints.  Contractions: Not present. Vag. Bleeding: None.  Movement: Present. Denies leaking of fluid.   The following portions of the patient's history were reviewed and updated as appropriate: allergies, current medications, past family history, past medical history, past social history, past surgical history and problem list. Problem list updated.  Objective:   Vitals:   04/10/16 1126  BP: 110/71  Pulse: (!) 101  Weight: 187 lb (84.8 kg)    Fetal Status:     Movement: Present     General:  Alert, oriented and cooperative. Patient is in no acute distress.  Skin: Skin is warm and dry. No rash noted.   Cardiovascular: Normal heart rate noted  Respiratory: Normal respiratory effort, no problems with respiration noted  Abdomen: Soft, gravid, appropriate for gestational age. Pain/Pressure: Present     Pelvic:  Cervical exam deferred        Extremities: Normal range of motion.     Mental Status: Normal mood and affect. Normal behavior. Normal judgment and thought content.   Urinalysis:      Assessment and Plan:  Pregnancy: G3P2001 at 2942w0d  1. Prior pregnancy with fetal demise Continue with twice weekly testing BPP on Monday/NST on Thursday - Fetal nonstress test; Future IOL at 39 weeks 2. Supervision of high risk pregnancy, antepartum Growth U/S 04/17/16  Preterm labor symptoms and general obstetric precautions including but not limited to vaginal bleeding, contractions, leaking of fluid and fetal movement were reviewed in detail with the patient. Please refer to After Visit Summary for other counseling recommendations.  Return in about 1 week (around 04/17/2016)  for OB visit.   Hermina StaggersMichael L Caron Tardif, MD

## 2016-04-14 ENCOUNTER — Ambulatory Visit (HOSPITAL_COMMUNITY)
Admission: RE | Admit: 2016-04-14 | Discharge: 2016-04-14 | Disposition: A | Discharge status: Home / Self Care | Payer: Medicaid Other | Source: Ambulatory Visit | Attending: Obstetrics & Gynecology | Admitting: Obstetrics & Gynecology

## 2016-04-14 ENCOUNTER — Encounter (HOSPITAL_COMMUNITY): Payer: Self-pay

## 2016-04-14 ENCOUNTER — Other Ambulatory Visit: Payer: Medicaid Other

## 2016-04-14 DIAGNOSIS — O99333 Smoking (tobacco) complicating pregnancy, third trimester: Secondary | ICD-10-CM | POA: Insufficient documentation

## 2016-04-14 DIAGNOSIS — O352XX0 Maternal care for (suspected) hereditary disease in fetus, not applicable or unspecified: Secondary | ICD-10-CM | POA: Diagnosis not present

## 2016-04-14 DIAGNOSIS — Z3A33 33 weeks gestation of pregnancy: Secondary | ICD-10-CM | POA: Insufficient documentation

## 2016-04-14 DIAGNOSIS — O09293 Supervision of pregnancy with other poor reproductive or obstetric history, third trimester: Secondary | ICD-10-CM | POA: Diagnosis not present

## 2016-04-14 DIAGNOSIS — O0990 Supervision of high risk pregnancy, unspecified, unspecified trimester: Secondary | ICD-10-CM

## 2016-04-14 DIAGNOSIS — O09299 Supervision of pregnancy with other poor reproductive or obstetric history, unspecified trimester: Secondary | ICD-10-CM

## 2016-04-15 ENCOUNTER — Other Ambulatory Visit (HOSPITAL_COMMUNITY): Payer: Medicaid Other

## 2016-04-17 ENCOUNTER — Encounter (HOSPITAL_COMMUNITY): Payer: Self-pay

## 2016-04-17 ENCOUNTER — Ambulatory Visit (HOSPITAL_COMMUNITY): Payer: Medicaid Other

## 2016-04-17 ENCOUNTER — Ambulatory Visit (INDEPENDENT_AMBULATORY_CARE_PROVIDER_SITE_OTHER): Payer: Medicaid Other | Admitting: Obstetrics & Gynecology

## 2016-04-17 VITALS — BP 114/69 | HR 92 | Wt 195.0 lb

## 2016-04-17 DIAGNOSIS — O09293 Supervision of pregnancy with other poor reproductive or obstetric history, third trimester: Secondary | ICD-10-CM

## 2016-04-17 DIAGNOSIS — O09299 Supervision of pregnancy with other poor reproductive or obstetric history, unspecified trimester: Secondary | ICD-10-CM

## 2016-04-17 DIAGNOSIS — O0990 Supervision of high risk pregnancy, unspecified, unspecified trimester: Secondary | ICD-10-CM

## 2016-04-17 NOTE — Progress Notes (Signed)
   PRENATAL VISIT NOTE  Subjective:  Brooke Lucas is a 31 y.o. G3P2001 at 4238w0d being seen today for ongoing prenatal care.  She is currently monitored for the following issues for this high-risk pregnancy and has Supervision of high risk pregnancy, antepartum and Prior pregnancy with fetal demise on her problem list.  Patient reports no complaints.  Contractions: Not present. Vag. Bleeding: None.  Movement: Present. Denies leaking of fluid.   The following portions of the patient's history were reviewed and updated as appropriate: allergies, current medications, past family history, past medical history, past social history, past surgical history and problem list. Problem list updated.  Objective:   Vitals:   04/17/16 0911  BP: 114/69  Pulse: 92  Weight: 195 lb (88.5 kg)    Fetal Status:     Movement: Present     General:  Alert, oriented and cooperative. Patient is in no acute distress.  Skin: Skin is warm and dry. No rash noted.   Cardiovascular: Normal heart rate noted  Respiratory: Normal respiratory effort, no problems with respiration noted  Abdomen: Soft, gravid, appropriate for gestational age. Pain/Pressure: Absent     Pelvic:  Cervical exam deferred        Extremities: Normal range of motion.     Mental Status: Normal mood and affect. Normal behavior. Normal judgment and thought content.   Assessment and Plan:  Pregnancy: G3P2001 at 7038w0d  1. Prior pregnancy with fetal demise - Fetal nonstress test performed today was reviewed and was found to be reactive.  Continue recommended antenatal testing and prenatal care.  2. Supervision of high risk pregnancy, antepartum Preterm labor symptoms and general obstetric precautions including but not limited to vaginal bleeding, contractions, leaking of fluid and fetal movement were reviewed in detail with the patient. Please refer to After Visit Summary for other counseling recommendations.  Return for Weekly OB visits and  weekly MFM BPPs as scheduled.Tereso Newcomer.   Ugonna A Anyanwu, MD

## 2016-04-17 NOTE — Patient Instructions (Signed)
Return to clinic for any scheduled appointments or obstetric concerns, or go to [facility] for evaluation  

## 2016-04-18 ENCOUNTER — Encounter (HOSPITAL_COMMUNITY): Payer: Self-pay

## 2016-04-21 ENCOUNTER — Encounter (HOSPITAL_COMMUNITY): Payer: Self-pay

## 2016-04-21 ENCOUNTER — Other Ambulatory Visit: Payer: Medicaid Other

## 2016-04-21 ENCOUNTER — Ambulatory Visit (HOSPITAL_COMMUNITY)
Admission: RE | Admit: 2016-04-21 | Discharge: 2016-04-21 | Disposition: A | Discharge status: Home / Self Care | Payer: Medicaid Other | Source: Ambulatory Visit | Attending: Obstetrics | Admitting: Obstetrics

## 2016-04-21 ENCOUNTER — Other Ambulatory Visit (HOSPITAL_COMMUNITY): Payer: Medicaid Other

## 2016-04-21 DIAGNOSIS — O09293 Supervision of pregnancy with other poor reproductive or obstetric history, third trimester: Secondary | ICD-10-CM | POA: Diagnosis not present

## 2016-04-21 DIAGNOSIS — O99333 Smoking (tobacco) complicating pregnancy, third trimester: Secondary | ICD-10-CM | POA: Insufficient documentation

## 2016-04-21 DIAGNOSIS — Z3493 Encounter for supervision of normal pregnancy, unspecified, third trimester: Secondary | ICD-10-CM

## 2016-04-21 DIAGNOSIS — Z3A34 34 weeks gestation of pregnancy: Secondary | ICD-10-CM | POA: Insufficient documentation

## 2016-04-21 DIAGNOSIS — O09299 Supervision of pregnancy with other poor reproductive or obstetric history, unspecified trimester: Secondary | ICD-10-CM

## 2016-04-21 NOTE — Procedures (Signed)
Brooke GottronJovona W Lucas [birth year] 4589w4d  Fetus A Non-Stress Test Interpretation for 04/21/16  Indication: Unsatisfactory BPP  Fetal Heart Rate A Mode: External Baseline Rate (A): 125 bpm Variability: Moderate Accelerations: 15 x 15 Decelerations: None  Uterine Activity Mode: Toco Contraction Frequency (min): none noted  Interpretation (Fetal Testing) Nonstress Test Interpretation: Reactive Comments: FHR tracing rev'd by Dr. Claudean SeveranceWhitecar.

## 2016-04-24 ENCOUNTER — Ambulatory Visit (INDEPENDENT_AMBULATORY_CARE_PROVIDER_SITE_OTHER): Payer: Medicaid Other | Admitting: Obstetrics and Gynecology

## 2016-04-24 VITALS — BP 129/78 | HR 81 | Wt 200.0 lb

## 2016-04-24 DIAGNOSIS — O09299 Supervision of pregnancy with other poor reproductive or obstetric history, unspecified trimester: Secondary | ICD-10-CM

## 2016-04-24 DIAGNOSIS — Z3009 Encounter for other general counseling and advice on contraception: Secondary | ICD-10-CM | POA: Insufficient documentation

## 2016-04-24 DIAGNOSIS — O09293 Supervision of pregnancy with other poor reproductive or obstetric history, third trimester: Secondary | ICD-10-CM

## 2016-04-24 DIAGNOSIS — O0990 Supervision of high risk pregnancy, unspecified, unspecified trimester: Secondary | ICD-10-CM

## 2016-04-24 NOTE — Progress Notes (Signed)
Subjective:  Brooke Lucas is a 31 y.o. G3P2001 at 7172w0d being seen today for ongoing prenatal care.  She is currently monitored for the following issues for this high-risk pregnancy and has Supervision of high risk pregnancy, antepartum; Prior pregnancy with fetal demise; and Unwanted fertility on her problem list.  Patient reports no complaints.  Contractions: Not present. Vag. Bleeding: None.  Movement: Present. Denies leaking of fluid.   The following portions of the patient's history were reviewed and updated as appropriate: allergies, current medications, past family history, past medical history, past social history, past surgical history and problem list. Problem list updated.  Objective:   Vitals:   04/24/16 0859  BP: 129/78  Pulse: 81  Weight: 200 lb (90.7 kg)    Fetal Status: Fetal Heart Rate (bpm): NST   Movement: Present     General:  Alert, oriented and cooperative. Patient is in no acute distress.  Skin: Skin is warm and dry. No rash noted.   Cardiovascular: Normal heart rate noted  Respiratory: Normal respiratory effort, no problems with respiration noted  Abdomen: Soft, gravid, appropriate for gestational age. Pain/Pressure: Absent     Pelvic:  Cervical exam deferred        Extremities: Normal range of motion.     Mental Status: Normal mood and affect. Normal behavior. Normal judgment and thought content.   Urinalysis: Urine Protein: Negative Urine Glucose: Negative  Assessment and Plan:  Pregnancy: G3P2001 at 2472w0d  1. Unwanted fertility BTL papers already signed  2. Prior fetal demise RNST today Continue with Weekly BPP and NST  3. Supervision IUP GBS GC/C testing next OB visit  Preterm labor symptoms and general obstetric precautions including but not limited to vaginal bleeding, contractions, leaking of fluid and fetal movement were reviewed in detail with the patient. Please refer to After Visit Summary for other counseling recommendations.  Return  in about 1 week (around 05/01/2016) for OB visit.   Hermina StaggersMichael L Toshiro Hanken, MD

## 2016-04-28 ENCOUNTER — Ambulatory Visit (HOSPITAL_COMMUNITY)
Admission: RE | Admit: 2016-04-28 | Discharge: 2016-04-28 | Disposition: A | Discharge status: Home / Self Care | Payer: Medicaid Other | Source: Ambulatory Visit | Attending: Obstetrics & Gynecology | Admitting: Obstetrics & Gynecology

## 2016-04-28 ENCOUNTER — Other Ambulatory Visit: Payer: Self-pay | Admitting: Obstetrics & Gynecology

## 2016-04-28 ENCOUNTER — Encounter (HOSPITAL_COMMUNITY): Payer: Self-pay

## 2016-04-28 ENCOUNTER — Other Ambulatory Visit: Payer: Medicaid Other

## 2016-04-28 DIAGNOSIS — Z3A35 35 weeks gestation of pregnancy: Secondary | ICD-10-CM

## 2016-04-28 DIAGNOSIS — O09299 Supervision of pregnancy with other poor reproductive or obstetric history, unspecified trimester: Secondary | ICD-10-CM

## 2016-04-28 DIAGNOSIS — O09293 Supervision of pregnancy with other poor reproductive or obstetric history, third trimester: Secondary | ICD-10-CM | POA: Insufficient documentation

## 2016-04-28 DIAGNOSIS — F17200 Nicotine dependence, unspecified, uncomplicated: Secondary | ICD-10-CM | POA: Insufficient documentation

## 2016-04-28 DIAGNOSIS — O99333 Smoking (tobacco) complicating pregnancy, third trimester: Secondary | ICD-10-CM

## 2016-04-28 DIAGNOSIS — O0990 Supervision of high risk pregnancy, unspecified, unspecified trimester: Secondary | ICD-10-CM

## 2016-05-01 ENCOUNTER — Ambulatory Visit (INDEPENDENT_AMBULATORY_CARE_PROVIDER_SITE_OTHER): Payer: Medicaid Other | Admitting: Obstetrics & Gynecology

## 2016-05-01 ENCOUNTER — Other Ambulatory Visit (HOSPITAL_COMMUNITY)
Admission: RE | Admit: 2016-05-01 | Discharge: 2016-05-01 | Disposition: A | Discharge status: Home / Self Care | Payer: Medicaid Other | Source: Ambulatory Visit | Attending: Obstetrics & Gynecology | Admitting: Obstetrics & Gynecology

## 2016-05-01 VITALS — BP 115/75 | HR 101 | Wt 197.0 lb

## 2016-05-01 DIAGNOSIS — O09293 Supervision of pregnancy with other poor reproductive or obstetric history, third trimester: Secondary | ICD-10-CM

## 2016-05-01 DIAGNOSIS — Z3A36 36 weeks gestation of pregnancy: Secondary | ICD-10-CM | POA: Insufficient documentation

## 2016-05-01 DIAGNOSIS — O0993 Supervision of high risk pregnancy, unspecified, third trimester: Secondary | ICD-10-CM | POA: Diagnosis not present

## 2016-05-01 DIAGNOSIS — O09299 Supervision of pregnancy with other poor reproductive or obstetric history, unspecified trimester: Secondary | ICD-10-CM

## 2016-05-01 DIAGNOSIS — O0990 Supervision of high risk pregnancy, unspecified, unspecified trimester: Secondary | ICD-10-CM

## 2016-05-01 NOTE — Progress Notes (Signed)
   PRENATAL VISIT NOTE  Subjective:  Brooke Lucas is a 31 y.o. G3P2001 at 549w0d being seen today for ongoing prenatal care.  She is currently monitored for the following issues for this high-risk pregnancy and has Supervision of high risk pregnancy, antepartum; Prior pregnancy with fetal demise; and Unwanted fertility on her problem list.  Patient reports no complaints.  Contractions: Irregular. Vag. Bleeding: None.  Movement: Present. Denies leaking of fluid.   The following portions of the patient's history were reviewed and updated as appropriate: allergies, current medications, past family history, past medical history, past social history, past surgical history and problem list. Problem list updated.  Objective:   Vitals:   05/01/16 0901  BP: 115/75  Pulse: (!) 101  Weight: 197 lb (89.4 kg)    Fetal Status: Fetal Heart Rate (bpm): NST   Movement: Present     General:  Alert, oriented and cooperative. Patient is in no acute distress.  Skin: Skin is warm and dry. No rash noted.   Cardiovascular: Normal heart rate noted  Respiratory: Normal respiratory effort, no problems with respiration noted  Abdomen: Soft, gravid, appropriate for gestational age. Pain/Pressure: Present     Pelvic:  Cervical exam performed        Extremities: Normal range of motion.     Mental Status: Normal mood and affect. Normal behavior. Normal judgment and thought content.   Assessment and Plan:  Pregnancy: G3P2001 at 289w0d  1. Supervision of high risk pregnancy, antepartum Normal growth on last f/u US  2. Prior pregnancy with fetal demise NST reactive today, BPP in 4 days  Preterm labor symptoms and general obstetric precautions including but not limited to vaginal bleeding, contractions, leaking of fluid and fetal movement were reviewed in detail with the patient. Please refer to After Visit Summary for other counseling recommendations.  Return in about 1 week (around 05/08/2016) for NST  2/week. BPP weekly and weekly NST  Adam PhenixJames G Arnold, MD

## 2016-05-01 NOTE — Patient Instructions (Signed)
Third Trimester of Pregnancy The third trimester is from week 28 through week 40 (months 7 through 9). The third trimester is a time when the unborn baby (fetus) is growing rapidly. At the end of the ninth month, the fetus is about 20 inches in length and weighs 6-10 pounds. Body changes during your third trimester Your body will continue to go through many changes during pregnancy. The changes vary from woman to woman. During the third trimester:  Your weight will continue to increase. You can expect to gain 25-35 pounds (11-16 kg) by the end of the pregnancy.  You may begin to get stretch marks on your hips, abdomen, and breasts.  You may urinate more often because the fetus is moving lower into your pelvis and pressing on your bladder.  You may develop or continue to have heartburn. This is caused by increased hormones that slow down muscles in the digestive tract.  You may develop or continue to have constipation because increased hormones slow digestion and cause the muscles that push waste through your intestines to relax.  You may develop hemorrhoids. These are swollen veins (varicose veins) in the rectum that can itch or be painful.  You may develop swollen, bulging veins (varicose veins) in your legs.  You may have increased body aches in the pelvis, back, or thighs. This is due to weight gain and increased hormones that are relaxing your joints.  You may have changes in your hair. These can include thickening of your hair, rapid growth, and changes in texture. Some women also have hair loss during or after pregnancy, or hair that feels dry or thin. Your hair will most likely return to normal after your baby is born.  Your breasts will continue to grow and they will continue to become tender. A yellow fluid (colostrum) may leak from your breasts. This is the first milk you are producing for your baby.  Your belly button may stick out.  You may notice more swelling in your hands,  face, or ankles.  You may have increased tingling or numbness in your hands, arms, and legs. The skin on your belly may also feel numb.  You may feel short of breath because of your expanding uterus.  You may have more problems sleeping. This can be caused by the size of your belly, increased need to urinate, and an increase in your body's metabolism.  You may notice the fetus "dropping," or moving lower in your abdomen (lightening).  You may have increased vaginal discharge.  You may notice your joints feel loose and you may have pain around your pelvic bone.  What to expect at prenatal visits You will have prenatal exams every 2 weeks until week 36. Then you will have weekly prenatal exams. During a routine prenatal visit:  You will be weighed to make sure you and the baby are growing normally.  Your blood pressure will be taken.  Your abdomen will be measured to track your baby's growth.  The fetal heartbeat will be listened to.  Any test results from the previous visit will be discussed.  You may have a cervical check near your due date to see if your cervix has softened or thinned (effaced).  You will be tested for Group B streptococcus. This happens between 35 and 37 weeks.  Your health care provider may ask you:  What your birth plan is.  How you are feeling.  If you are feeling the baby move.  If you have had   any abnormal symptoms, such as leaking fluid, bleeding, severe headaches, or abdominal cramping.  If you are using any tobacco products, including cigarettes, chewing tobacco, and electronic cigarettes.  If you have any questions.  Other tests or screenings that may be performed during your third trimester include:  Blood tests that check for low iron levels (anemia).  Fetal testing to check the health, activity level, and growth of the fetus. Testing is done if you have certain medical conditions or if there are problems during the  pregnancy.  Nonstress test (NST). This test checks the health of your baby to make sure there are no signs of problems, such as the baby not getting enough oxygen. During this test, a belt is placed around your belly. The baby is made to move, and its heart rate is monitored during movement.  What is false labor? False labor is a condition in which you feel small, irregular tightenings of the muscles in the womb (contractions) that usually go away with rest, changing position, or drinking water. These are called Braxton Hicks contractions. Contractions may last for hours, days, or even weeks before true labor sets in. If contractions come at regular intervals, become more frequent, increase in intensity, or become painful, you should see your health care provider. What are the signs of labor?  Abdominal cramps.  Regular contractions that start at 10 minutes apart and become stronger and more frequent with time.  Contractions that start on the top of the uterus and spread down to the lower abdomen and back.  Increased pelvic pressure and dull back pain.  A watery or bloody mucus discharge that comes from the vagina.  Leaking of amniotic fluid. This is also known as your "water breaking." It could be a slow trickle or a gush. Let your health care provider know if it has a color or strange odor. If you have any of these signs, call your health care provider right away, even if it is before your due date. Follow these instructions at home: Medicines  Follow your health care provider's instructions regarding medicine use. Specific medicines may be either safe or unsafe to take during pregnancy.  Take a prenatal vitamin that contains at least 600 micrograms (mcg) of folic acid.  If you develop constipation, try taking a stool softener if your health care provider approves. Eating and drinking  Eat a balanced diet that includes fresh fruits and vegetables, whole grains, good sources of protein  such as meat, eggs, or tofu, and low-fat dairy. Your health care provider will help you determine the amount of weight gain that is right for you.  Avoid raw meat and uncooked cheese. These carry germs that can cause birth defects in the baby.  If you have low calcium intake from food, talk to your health care provider about whether you should take a daily calcium supplement.  Eat four or five small meals rather than three large meals a day.  Limit foods that are high in fat and processed sugars, such as fried and sweet foods.  To prevent constipation: ? Drink enough fluid to keep your urine clear or pale yellow. ? Eat foods that are high in fiber, such as fresh fruits and vegetables, whole grains, and beans. Activity  Exercise only as directed by your health care provider. Most women can continue their usual exercise routine during pregnancy. Try to exercise for 30 minutes at least 5 days a week. Stop exercising if you experience uterine contractions.  Avoid heavy   lifting.  Do not exercise in extreme heat or humidity, or at high altitudes.  Wear low-heel, comfortable shoes.  Practice good posture.  You may continue to have sex unless your health care provider tells you otherwise. Relieving pain and discomfort  Take frequent breaks and rest with your legs elevated if you have leg cramps or low back pain.  Take warm sitz baths to soothe any pain or discomfort caused by hemorrhoids. Use hemorrhoid cream if your health care provider approves.  Wear a good support bra to prevent discomfort from breast tenderness.  If you develop varicose veins: ? Wear support pantyhose or compression stockings as told by your healthcare provider. ? Elevate your feet for 15 minutes, 3-4 times a day. Prenatal care  Write down your questions. Take them to your prenatal visits.  Keep all your prenatal visits as told by your health care provider. This is important. Safety  Wear your seat belt at  all times when driving.  Make a list of emergency phone numbers, including numbers for family, friends, the hospital, and police and fire departments. General instructions  Avoid cat litter boxes and soil used by cats. These carry germs that can cause birth defects in the baby. If you have a cat, ask someone to clean the litter box for you.  Do not travel far distances unless it is absolutely necessary and only with the approval of your health care provider.  Do not use hot tubs, steam rooms, or saunas.  Do not drink alcohol.  Do not use any products that contain nicotine or tobacco, such as cigarettes and e-cigarettes. If you need help quitting, ask your health care provider.  Do not use any medicinal herbs or unprescribed drugs. These chemicals affect the formation and growth of the baby.  Do not douche or use tampons or scented sanitary pads.  Do not cross your legs for long periods of time.  To prepare for the arrival of your baby: ? Take prenatal classes to understand, practice, and ask questions about labor and delivery. ? Make a trial run to the hospital. ? Visit the hospital and tour the maternity area. ? Arrange for maternity or paternity leave through employers. ? Arrange for family and friends to take care of pets while you are in the hospital. ? Purchase a rear-facing car seat and make sure you know how to install it in your car. ? Pack your hospital bag. ? Prepare the baby's nursery. Make sure to remove all pillows and stuffed animals from the baby's crib to prevent suffocation.  Visit your dentist if you have not gone during your pregnancy. Use a soft toothbrush to brush your teeth and be gentle when you floss. Contact a health care provider if:  You are unsure if you are in labor or if your water has broken.  You become dizzy.  You have mild pelvic cramps, pelvic pressure, or nagging pain in your abdominal area.  You have lower back pain.  You have persistent  nausea, vomiting, or diarrhea.  You have an unusual or bad smelling vaginal discharge.  You have pain when you urinate. Get help right away if:  Your water breaks before 37 weeks.  You have regular contractions less than 5 minutes apart before 37 weeks.  You have a fever.  You are leaking fluid from your vagina.  You have spotting or bleeding from your vagina.  You have severe abdominal pain or cramping.  You have rapid weight loss or weight gain.    You have shortness of breath with chest pain.  You notice sudden or extreme swelling of your face, hands, ankles, feet, or legs.  Your baby makes fewer than 10 movements in 2 hours.  You have severe headaches that do not go away when you take medicine.  You have vision changes. Summary  The third trimester is from week 28 through week 40, months 7 through 9. The third trimester is a time when the unborn baby (fetus) is growing rapidly.  During the third trimester, your discomfort may increase as you and your baby continue to gain weight. You may have abdominal, leg, and back pain, sleeping problems, and an increased need to urinate.  During the third trimester your breasts will keep growing and they will continue to become tender. A yellow fluid (colostrum) may leak from your breasts. This is the first milk you are producing for your baby.  False labor is a condition in which you feel small, irregular tightenings of the muscles in the womb (contractions) that eventually go away. These are called Braxton Hicks contractions. Contractions may last for hours, days, or even weeks before true labor sets in.  Signs of labor can include: abdominal cramps; regular contractions that start at 10 minutes apart and become stronger and more frequent with time; watery or bloody mucus discharge that comes from the vagina; increased pelvic pressure and dull back pain; and leaking of amniotic fluid. This information is not intended to replace advice  given to you by your health care provider. Make sure you discuss any questions you have with your health care provider. Document Released: 01/21/2001 Document Revised: 07/05/2015 Document Reviewed: 03/30/2012 Elsevier Interactive Patient Education  2017 [employer]  

## 2016-05-02 LAB — CERVICOVAGINAL ANCILLARY ONLY
Bacterial vaginitis: NEGATIVE
Candida vaginitis: POSITIVE — AB
Chlamydia: NEGATIVE
Neisseria Gonorrhea: NEGATIVE
Trichomonas: POSITIVE — AB

## 2016-05-03 LAB — STREP GP B NAA: STREP GROUP B AG: POSITIVE — AB

## 2016-05-04 ENCOUNTER — Encounter (HOSPITAL_COMMUNITY): Payer: Self-pay

## 2016-05-04 ENCOUNTER — Inpatient Hospital Stay (HOSPITAL_COMMUNITY)
Admission: AD | Admit: 2016-05-04 | Discharge: 2016-05-04 | Disposition: A | Discharge status: Home / Self Care | Payer: Medicaid Other | Source: Ambulatory Visit | Attending: Obstetrics and Gynecology | Admitting: Obstetrics and Gynecology

## 2016-05-04 DIAGNOSIS — R112 Nausea with vomiting, unspecified: Secondary | ICD-10-CM | POA: Diagnosis present

## 2016-05-04 DIAGNOSIS — O99343 Other mental disorders complicating pregnancy, third trimester: Secondary | ICD-10-CM | POA: Insufficient documentation

## 2016-05-04 DIAGNOSIS — O98813 Other maternal infectious and parasitic diseases complicating pregnancy, third trimester: Secondary | ICD-10-CM | POA: Insufficient documentation

## 2016-05-04 DIAGNOSIS — O479 False labor, unspecified: Secondary | ICD-10-CM

## 2016-05-04 DIAGNOSIS — Z3689 Encounter for other specified antenatal screening: Secondary | ICD-10-CM

## 2016-05-04 DIAGNOSIS — O368130 Decreased fetal movements, third trimester, not applicable or unspecified: Secondary | ICD-10-CM | POA: Insufficient documentation

## 2016-05-04 DIAGNOSIS — Z880 Allergy status to penicillin: Secondary | ICD-10-CM | POA: Diagnosis not present

## 2016-05-04 DIAGNOSIS — O09293 Supervision of pregnancy with other poor reproductive or obstetric history, third trimester: Secondary | ICD-10-CM | POA: Insufficient documentation

## 2016-05-04 DIAGNOSIS — O4703 False labor before 37 completed weeks of gestation, third trimester: Secondary | ICD-10-CM | POA: Insufficient documentation

## 2016-05-04 DIAGNOSIS — O99013 Anemia complicating pregnancy, third trimester: Secondary | ICD-10-CM | POA: Insufficient documentation

## 2016-05-04 DIAGNOSIS — O9989 Other specified diseases and conditions complicating pregnancy, childbirth and the puerperium: Secondary | ICD-10-CM | POA: Diagnosis not present

## 2016-05-04 DIAGNOSIS — O212 Late vomiting of pregnancy: Secondary | ICD-10-CM | POA: Diagnosis not present

## 2016-05-04 DIAGNOSIS — O99333 Smoking (tobacco) complicating pregnancy, third trimester: Secondary | ICD-10-CM | POA: Diagnosis not present

## 2016-05-04 DIAGNOSIS — B3731 Acute candidiasis of vulva and vagina: Secondary | ICD-10-CM

## 2016-05-04 DIAGNOSIS — D573 Sickle-cell trait: Secondary | ICD-10-CM | POA: Insufficient documentation

## 2016-05-04 DIAGNOSIS — B373 Candidiasis of vulva and vagina: Secondary | ICD-10-CM | POA: Insufficient documentation

## 2016-05-04 DIAGNOSIS — O0990 Supervision of high risk pregnancy, unspecified, unspecified trimester: Secondary | ICD-10-CM

## 2016-05-04 DIAGNOSIS — F17210 Nicotine dependence, cigarettes, uncomplicated: Secondary | ICD-10-CM | POA: Diagnosis not present

## 2016-05-04 DIAGNOSIS — O219 Vomiting of pregnancy, unspecified: Secondary | ICD-10-CM

## 2016-05-04 DIAGNOSIS — Z3A36 36 weeks gestation of pregnancy: Secondary | ICD-10-CM | POA: Insufficient documentation

## 2016-05-04 DIAGNOSIS — Z888 Allergy status to other drugs, medicaments and biological substances status: Secondary | ICD-10-CM | POA: Insufficient documentation

## 2016-05-04 DIAGNOSIS — F419 Anxiety disorder, unspecified: Secondary | ICD-10-CM | POA: Diagnosis not present

## 2016-05-04 DIAGNOSIS — O09299 Supervision of pregnancy with other poor reproductive or obstetric history, unspecified trimester: Secondary | ICD-10-CM

## 2016-05-04 LAB — URINALYSIS, ROUTINE W REFLEX MICROSCOPIC
Bilirubin Urine: NEGATIVE
Glucose, UA: NEGATIVE mg/dL
Ketones, ur: NEGATIVE mg/dL
Nitrite: NEGATIVE
PH: 7 (ref 5.0–8.0)
Protein, ur: NEGATIVE mg/dL
SPECIFIC GRAVITY, URINE: 1.003 — AB (ref 1.005–1.030)

## 2016-05-04 LAB — RAPID URINE DRUG SCREEN, HOSP PERFORMED
Amphetamines: NOT DETECTED
BARBITURATES: NOT DETECTED
BENZODIAZEPINES: NOT DETECTED
COCAINE: NOT DETECTED
OPIATES: NOT DETECTED
Tetrahydrocannabinol: POSITIVE — AB

## 2016-05-04 LAB — WET PREP, GENITAL
CLUE CELLS WET PREP: NONE SEEN
SPERM: NONE SEEN
Trich, Wet Prep: NONE SEEN

## 2016-05-04 MED ORDER — TERCONAZOLE 0.4 % VA CREA: 1.0000 | TOPICAL_CREAM | Freq: Every day | VAGINAL | 0 refills | Status: DC

## 2016-05-04 MED ORDER — ONDANSETRON 8 MG PO TBDP
8.0000 mg | ORAL_TABLET | Freq: Once | ORAL | Status: AC
  Administered 2016-05-04: 8 mg via ORAL
  Filled 2016-05-04: qty 1

## 2016-05-04 MED ORDER — ONDANSETRON HCL 4 MG PO TABS: 4.0000 mg | ORAL_TABLET | Freq: Three times a day (TID) | ORAL | 0 refills | Status: DC | PRN

## 2016-05-04 NOTE — MAU Provider Note (Signed)
Chief Complaint:  Emesis During Pregnancy and Abdominal Pain   First Provider Initiated Contact with Patient 05/04/16 0454      HPI: Brooke Lucas is a 31 y.o. G3P2001 at [redacted]w[redacted]d who presents to maternity admissions reporting onset of n/v yesterday at 2 am, with vomiting x 2 in 24 hours.  She also reports intermittent pelvic pressure but is unsure if she is having contractions. She has decreased fetal movement for the last 24 hours but is feeling more movement since monitors were applied in MAU.  She reports shortness of breath in the MAU and reports this is intermittent and comes and goes every few minutes.  She has not tried any treatments.  Nothing makes her symptoms better or worse.  There are no other associated symptoms.  She reports that her IUFD started like this with irregular cramping and n/v so she is worried. She denies LOF, vaginal bleeding, vaginal itching/burning, urinary symptoms, h/a, dizziness, or fever/chills.    HPI  Past Medical History: Past Medical History:  Diagnosis Date  . Anxiety   . Depression    after loss of son  . Sickle cell trait (HCC)   . 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 extraction wisdom      Family History: Family History  Problem Relation Age of Onset  . Other Neg Hx     Social History: Social History  Substance Use Topics  . Smoking status: Current Every Day Smoker    Packs/day: 0.25    Years: 0.00    Types: Cigarettes  . Smokeless tobacco: Never Used     Comment: 1/2 pack per day   . Alcohol use No     Comment: Socially    Allergies:  Allergies  Allergen Reactions  . Amoxicillin Anaphylaxis  . Penicillins Anaphylaxis  . Benadryl [Diphenhydramine] Anxiety    Meds:  Prescriptions Prior to Admission  Medication Sig Dispense Refill Last Dose  . Prenat-FeCbn-FeAspGl-FA-Omega (OB COMPLETE PETITE) 35-5-1-200 MG CAPS Take 1 capsule by mouth daily before breakfast. 90 capsule 3 05/03/2016 at Unknown time  . ranitidine (ZANTAC) 150 MG tablet Take 1 tablet (150 mg total) by mouth 2 (two) times daily. 60 tablet 5 Past Week at Unknown time    ROS:  Review of Systems  Constitutional: Negative for chills, fatigue and fever.  Eyes: Negative for visual disturbance.  Respiratory: Positive for shortness of breath.   Cardiovascular: Negative for chest pain.  Gastrointestinal: Positive for nausea and vomiting. Negative for abdominal pain.  Genitourinary: Positive for pelvic pain. Negative for difficulty urinating, dysuria, flank pain, vaginal bleeding, vaginal discharge and vaginal pain.  Neurological: Negative for dizziness and headaches.  Psychiatric/Behavioral: Negative.      I have reviewed patient's Past Medical Hx, Surgical Hx, Family Hx, Social Hx, medications and allergies.   Physical  Exam  Patient Vitals for the past 24 hrs:  BP Temp Temp src Pulse Resp  05/04/16 0417 112/76 98 F (36.7 C) Oral 97 19   O2 sats 100% on room air  Constitutional: Well-developed, well-nourished female in no acute distress.  HEART: normal rate, heart sounds, regular rhythm RESP: normal effort, lung sounds clear and equal bilaterally GI: Abd soft, non-tender, gravid appropriate for gestational age.  MS: Extremities nontender, no edema, normal ROM Neurologic: Alert and oriented x 4.  GU: Neg CVAT.   Dilation: 1 Effacement (%): 50 Cervical Position: Posterior Station: -3 Exam by:: Sharen Counter CNM  FHT:  Baseline 125, moderate variability, accelerations present, no  decelerations Contractions: None on toco or to palpation   Labs:  B/Positive/-- (09/19 1340) Results for orders placed or performed during the hospital encounter of 05/04/16 (from the past 48 hour(s))  Urinalysis, Routine w reflex microscopic     Status: Abnormal   Collection Time: 05/04/16  4:04 AM  Result Value Ref Range   Color, Urine YELLOW YELLOW   APPearance HAZY (A) CLEAR   Specific Gravity, Urine 1.003 (L) 1.005 - 1.030   pH 7.0 5.0 - 8.0   Glucose, UA NEGATIVE NEGATIVE mg/dL   Hgb urine dipstick SMALL (A) NEGATIVE   Bilirubin Urine NEGATIVE NEGATIVE   Ketones, ur NEGATIVE NEGATIVE mg/dL   Protein, ur NEGATIVE NEGATIVE mg/dL   Nitrite NEGATIVE NEGATIVE   Leukocytes, UA LARGE (A) NEGATIVE   RBC / HPF 0-5 0 - 5 RBC/hpf   WBC, UA 6-30 0 - 5 WBC/hpf   Bacteria, UA RARE (A) NONE SEEN   Squamous Epithelial / LPF 0-5 (A) NONE SEEN  Urine rapid drug screen (hosp performed)     Status: Abnormal   Collection Time: 05/04/16  4:04 AM  Result Value Ref Range   Opiates NONE DETECTED NONE DETECTED   Cocaine NONE DETECTED NONE DETECTED   Benzodiazepines NONE DETECTED NONE DETECTED   Amphetamines NONE DETECTED NONE DETECTED   Tetrahydrocannabinol POSITIVE (A) NONE DETECTED   Barbiturates NONE DETECTED NONE DETECTED    Comment:        DRUG SCREEN FOR MEDICAL PURPOSES ONLY.  IF CONFIRMATION IS NEEDED FOR ANY PURPOSE, NOTIFY LAB WITHIN 5 DAYS.        LOWEST DETECTABLE LIMITS FOR URINE DRUG SCREEN Drug Class       Cutoff (ng/mL) Amphetamine      1000 Barbiturate      200 Benzodiazepine   200 Tricyclics       300 Opiates          300 Cocaine          300 THC              50   Wet prep, genital     Status: Abnormal   Collection Time: 05/04/16  6:02 AM  Result Value Ref Range   Yeast Wet Prep HPF POC PRESENT (A) NONE SEEN   Trich, Wet Prep NONE SEEN NONE SEEN   Clue Cells Wet Prep HPF POC NONE SEEN NONE SEEN   WBC, Wet Prep HPF POC TOO NUMEROUS TO COUNT (A) NONE SEEN     Comment: MANY BACTERIA SEEN   Sperm NONE SEEN    Imaging:    MAU Course/MDM: I have ordered labs and reviewed results.  NST reviewed and reactive No evidence of labor today.  Treated nausea with Zofran 4 mg PO x 1 dose and pt tolerated PO food/fluids well.  Pt  feeling fetal movement now in MAU so is reassured.  Pt reports she has anxiety since her IUFD and is having increased anxiety in this pregnancy. Encouraged outpatient counseling, continue current prenatal care with [facility].  Will treat for candidiasis since it may contribute to pt discomfort, Terazol 7 Rx sent to pharmacy. Pt stable at time of discharge.    Assessment: No diagnosis found. 1. Nausea and vomiting during pregnancy   2. Decreased fetal movements in third trimester, single or unspecified fetus   3. NST (non-stress test) reactive   4. Braxton Hicks contractions   5. Vaginal candidiasis   6. Prior pregnancy with fetal demise   7. Anxiety during pregnancy in third trimester, antepartum   8. Supervision of high risk pregnancy, antepartum    Plan: Discharge home Labor precautions and fetal kick counts   Allergies as of 05/04/2016      Reactions   Amoxicillin Anaphylaxis   Penicillins Anaphylaxis   Benadryl [diphenhydramine] Anxiety      Medication List    TAKE these medications   OB COMPLETE PETITE 35-5-1-200 MG Caps Take 1 capsule by mouth daily before breakfast.   ondansetron 4 MG tablet Commonly known as:  ZOFRAN Take 1 tablet (4 mg total) by mouth every 8 (eight) hours as needed for nausea or vomiting.   ranitidine 150 MG tablet Commonly known as:  ZANTAC Take 1 tablet (150 mg total) by mouth 2 (two) times daily.   terconazole 0.4 % vaginal cream Commonly known as:  TERAZOL 7 Place 1 applicator vaginally at bedtime.       Sharen CounterLisa Leftwich-Kirby Certified Nurse-Midwife 05/04/2016 5:06 AM

## 2016-05-04 NOTE — MAU Note (Signed)
Pt c/o that she woke up out of her sleep with vomiting and upper abdominal pain tonight. States she vomited only twice tonight. +FM. Pt denies contractions or vag bleeding. Has "a lot of fluid coming." States she has been urinating a lot and is unsure if she peed on herself or if her water broke.

## 2016-05-05 ENCOUNTER — Ambulatory Visit (HOSPITAL_COMMUNITY)
Admission: RE | Admit: 2016-05-05 | Discharge: 2016-05-05 | Disposition: A | Discharge status: Home / Self Care | Payer: Medicaid Other | Source: Ambulatory Visit | Attending: Obstetrics & Gynecology | Admitting: Obstetrics & Gynecology

## 2016-05-05 ENCOUNTER — Other Ambulatory Visit (HOSPITAL_COMMUNITY): Payer: Self-pay | Admitting: Obstetrics and Gynecology

## 2016-05-05 ENCOUNTER — Other Ambulatory Visit: Payer: Medicaid Other

## 2016-05-05 ENCOUNTER — Encounter (HOSPITAL_COMMUNITY): Payer: Self-pay

## 2016-05-05 DIAGNOSIS — O09293 Supervision of pregnancy with other poor reproductive or obstetric history, third trimester: Secondary | ICD-10-CM

## 2016-05-05 DIAGNOSIS — Z3A37 37 weeks gestation of pregnancy: Secondary | ICD-10-CM

## 2016-05-05 DIAGNOSIS — Z3A36 36 weeks gestation of pregnancy: Secondary | ICD-10-CM

## 2016-05-05 DIAGNOSIS — O99333 Smoking (tobacco) complicating pregnancy, third trimester: Secondary | ICD-10-CM | POA: Insufficient documentation

## 2016-05-05 DIAGNOSIS — O352XX0 Maternal care for (suspected) hereditary disease in fetus, not applicable or unspecified: Secondary | ICD-10-CM

## 2016-05-05 DIAGNOSIS — Z3A38 38 weeks gestation of pregnancy: Secondary | ICD-10-CM

## 2016-05-05 LAB — CULTURE, OB URINE

## 2016-05-05 NOTE — Addendum Note (Signed)
Encounter addended by: Vivien Rotaachael H Denyse Fillion, RT on: 05/05/2016 10:34 AM<BR>    Actions taken: Imaging Exam ended

## 2016-05-07 ENCOUNTER — Other Ambulatory Visit: Payer: Self-pay | Admitting: *Deleted

## 2016-05-07 DIAGNOSIS — B3731 Acute candidiasis of vulva and vagina: Secondary | ICD-10-CM

## 2016-05-07 DIAGNOSIS — A599 Trichomoniasis, unspecified: Secondary | ICD-10-CM

## 2016-05-07 DIAGNOSIS — B373 Candidiasis of vulva and vagina: Secondary | ICD-10-CM

## 2016-05-07 MED ORDER — METRONIDAZOLE 500 MG PO TABS: 2000.0000 mg | ORAL_TABLET | Freq: Once | ORAL | 0 refills | Status: AC

## 2016-05-07 MED ORDER — FLUCONAZOLE 150 MG PO TABS: 150.0000 mg | ORAL_TABLET | Freq: Once | ORAL | 0 refills | Status: DC

## 2016-05-08 ENCOUNTER — Ambulatory Visit (INDEPENDENT_AMBULATORY_CARE_PROVIDER_SITE_OTHER): Payer: Medicaid Other | Admitting: Obstetrics & Gynecology

## 2016-05-08 VITALS — BP 125/77 | HR 83 | Wt 201.0 lb

## 2016-05-08 DIAGNOSIS — O09293 Supervision of pregnancy with other poor reproductive or obstetric history, third trimester: Secondary | ICD-10-CM

## 2016-05-08 DIAGNOSIS — O0990 Supervision of high risk pregnancy, unspecified, unspecified trimester: Secondary | ICD-10-CM

## 2016-05-08 DIAGNOSIS — O09299 Supervision of pregnancy with other poor reproductive or obstetric history, unspecified trimester: Secondary | ICD-10-CM

## 2016-05-08 NOTE — Progress Notes (Signed)
NST reactive   PRENATAL VISIT NOTE  Subjective:  Brooke Lucas is a 31 y.o. G3P2001 at 6769w0d being seen today for ongoing prenatal care.  She is currently monitored for the following issues for this high-risk pregnancy and has Supervision of high risk pregnancy, antepartum; Prior pregnancy with fetal demise; and Unwanted fertility on her problem list.  Patient reports no complaints.  Contractions: Not present. Vag. Bleeding: None.  Movement: Present. Denies leaking of fluid.   The following portions of the patient's history were reviewed and updated as appropriate: allergies, current medications, past family history, past medical history, past social history, past surgical history and problem list. Problem list updated.  Objective:   Vitals:   05/08/16 0905  BP: 125/77  Pulse: 83  Weight: 201 lb (91.2 kg)    Fetal Status:     Movement: Present     General:  Alert, oriented and cooperative. Patient is in no acute distress.  Skin: Skin is warm and dry. No rash noted.   Cardiovascular: Normal heart rate noted  Respiratory: Normal respiratory effort, no problems with respiration noted  Abdomen: Soft, gravid, appropriate for gestational age. Pain/Pressure: Absent     Pelvic:  Cervical exam deferred        Extremities: Normal range of motion.  Edema: Trace  Mental Status: Normal mood and affect. Normal behavior. Normal judgment and thought content.   Assessment and Plan:  Pregnancy: G3P2001 at 3669w0d  1. Supervision of high risk pregnancy, antepartum NST reactive and BPP 8/8 on 3/26 - Fetal nonstress test; Future  2. Prior pregnancy with fetal demise IOL 39 weeks  Term labor symptoms and general obstetric precautions including but not limited to vaginal bleeding, contractions, leaking of fluid and fetal movement were reviewed in detail with the patient. Please refer to After Visit Summary for other counseling recommendations.  Return in about 1 week (around  05/15/2016).   Adam PhenixJames G Arnold, MD

## 2016-05-08 NOTE — Patient Instructions (Signed)
Labor Induction Labor induction is when steps are taken to cause a pregnant woman to begin the labor process. Most women go into labor on their own between 37 weeks and 42 weeks of the pregnancy. When this does not happen or when there is a medical need, methods may be used to induce labor. Labor induction causes a pregnant woman's uterus to contract. It also causes the cervix to soften (ripen), open (dilate), and thin out (efface). Usually, labor is not induced before 39 weeks of the pregnancy unless there is a problem with the baby or mother. Before inducing labor, your health care provider will consider a number of factors, including the following:  The medical condition of you and the baby.  How many weeks along you are.  The status of the baby's lung maturity.  The condition of the cervix.  The position of the baby. What are the reasons for labor induction? Labor may be induced for the following reasons:  The health of the baby or mother is at risk.  The pregnancy is overdue by 1 week or more.  The water breaks but labor does not start on its own.  The mother has a health condition or serious illness, such as high blood pressure, infection, placental abruption, or diabetes.  The amniotic fluid amounts are low around the baby.  The baby is distressed. Convenience or wanting the baby to be born on a certain date is not a reason for inducing labor. What methods are used for labor induction? Several methods of labor induction may be used, such as:  Prostaglandin medicine. This medicine causes the cervix to dilate and ripen. The medicine will also start contractions. It can be taken by mouth or by inserting a suppository into the vagina.  Inserting a thin tube (catheter) with a balloon on the end into the vagina to dilate the cervix. Once inserted, the balloon is expanded with water, which causes the cervix to open.  Stripping the membranes. Your health care provider separates  amniotic sac tissue from the cervix, causing the cervix to be stretched and causing the release of a hormone called progesterone. This may cause the uterus to contract. It is often done during an office visit. You will be sent home to wait for the contractions to begin. You will then come in for an induction.  Breaking the water. Your health care provider makes a hole in the amniotic sac using a small instrument. Once the amniotic sac breaks, contractions should begin. This may still take hours to see an effect.  Medicine to trigger or strengthen contractions. This medicine is given through an IV access tube inserted into a vein in your arm. All of the methods of induction, besides stripping the membranes, will be done in the hospital. Induction is done in the hospital so that you and the baby can be carefully monitored. How long does it take for labor to be induced? Some inductions can take up to 2-3 days. Depending on the cervix, it usually takes less time. It takes longer when you are induced early in the pregnancy or if this is your first pregnancy. If a mother is still pregnant and the induction has been going on for 2-3 days, either the mother will be sent home or a cesarean delivery will be needed. What are the risks associated with labor induction? Some of the risks of induction include:  Changes in fetal heart rate, such as too high, too low, or erratic.  Fetal distress.    Chance of infection for the mother and baby.  Increased chance of having a cesarean delivery.  Breaking off (abruption) of the placenta from the uterus (rare).  Uterine rupture (very rare). When induction is needed for medical reasons, the benefits of induction may outweigh the risks. What are some reasons for not inducing labor? Labor induction should not be done if:  It is shown that your baby does not tolerate labor.  You have had previous surgeries on your uterus, such as a myomectomy or the removal of  fibroids.  Your placenta lies very low in the uterus and blocks the opening of the cervix (placenta previa).  Your baby is not in a head-down position.  The umbilical cord drops down into the birth canal in front of the baby. This could cut off the baby's blood and oxygen supply.  You have had a previous cesarean delivery.  There are unusual circumstances, such as the baby being extremely premature. This information is not intended to replace advice given to you by your health care provider. Make sure you discuss any questions you have with your health care provider. Document Released: 06/18/2006 Document Revised: 07/05/2015 Document Reviewed: 08/26/2012 Elsevier Interactive Patient Education  2017 [employer]  

## 2016-05-12 ENCOUNTER — Encounter (HOSPITAL_COMMUNITY): Payer: Self-pay

## 2016-05-12 ENCOUNTER — Ambulatory Visit (HOSPITAL_COMMUNITY)
Admission: RE | Admit: 2016-05-12 | Discharge: 2016-05-12 | Disposition: A | Discharge status: Home / Self Care | Payer: Medicaid Other | Source: Ambulatory Visit | Attending: Obstetrics & Gynecology | Admitting: Obstetrics & Gynecology

## 2016-05-12 DIAGNOSIS — O0993 Supervision of high risk pregnancy, unspecified, third trimester: Secondary | ICD-10-CM | POA: Diagnosis present

## 2016-05-12 DIAGNOSIS — O99333 Smoking (tobacco) complicating pregnancy, third trimester: Secondary | ICD-10-CM | POA: Insufficient documentation

## 2016-05-12 DIAGNOSIS — Z3A37 37 weeks gestation of pregnancy: Secondary | ICD-10-CM | POA: Diagnosis not present

## 2016-05-12 DIAGNOSIS — O09293 Supervision of pregnancy with other poor reproductive or obstetric history, third trimester: Secondary | ICD-10-CM | POA: Insufficient documentation

## 2016-05-15 ENCOUNTER — Ambulatory Visit (INDEPENDENT_AMBULATORY_CARE_PROVIDER_SITE_OTHER): Payer: Medicaid Other | Admitting: Obstetrics & Gynecology

## 2016-05-15 ENCOUNTER — Encounter (HOSPITAL_COMMUNITY): Payer: Self-pay | Admitting: *Deleted

## 2016-05-15 ENCOUNTER — Telehealth (HOSPITAL_COMMUNITY): Payer: Self-pay | Admitting: *Deleted

## 2016-05-15 VITALS — BP 126/79 | HR 86 | Wt 199.2 lb

## 2016-05-15 DIAGNOSIS — O0990 Supervision of high risk pregnancy, unspecified, unspecified trimester: Secondary | ICD-10-CM

## 2016-05-15 DIAGNOSIS — O09299 Supervision of pregnancy with other poor reproductive or obstetric history, unspecified trimester: Secondary | ICD-10-CM

## 2016-05-15 NOTE — Progress Notes (Signed)
BPP and f/u US 4/2 reviewed NST reactive today   PRENATAL VISIT NOTE  Subjective:  Brooke Lucas is a 31 y.o. G3P2001 at [redacted]w[redacted]d being seen today for ongoing prenatal care.  She is currently monitored for the following issues for this high-risk pregnancy and has Supervision of high risk pregnancy, antepartum; Prior pregnancy with fetal demise; and Unwanted fertility on her problem list.  Patient reports no complaints.  Contractions: Irregular. Vag. Bleeding: None.  Movement: Present. Denies leaking of fluid.   The following portions of the patient's history were reviewed and updated as appropriate: allergies, current medications, past family history, past medical history, past social history, past surgical history and problem list. Problem list updated.  Objective:   Vitals:   05/15/16 0858  BP: 126/79  Pulse: 86  Weight: 199 lb 3.2 oz (90.4 kg)    Fetal Status: Fetal Heart Rate (bpm): NST   Movement: Present     General:  Alert, oriented and cooperative. Patient is in no acute distress.  Skin: Skin is warm and dry. No rash noted.   Cardiovascular: Normal heart rate noted  Respiratory: Normal respiratory effort, no problems with respiration noted  Abdomen: Soft, gravid, appropriate for gestational age. Pain/Pressure: Present     Pelvic:  Cervical exam deferred        Extremities: Normal range of motion.  Edema: Trace  Mental Status: Normal mood and affect. Normal behavior. Normal judgment and thought content.   Assessment and Plan:  Pregnancy: G3P2001 at [redacted]w[redacted]d  1. Supervision of high risk pregnancy, antepartum Good fetal surveillance  Term labor symptoms and general obstetric precautions including but not limited to vaginal bleeding, contractions, leaking of fluid and fetal movement were reviewed in detail with the patient. Please refer to After Visit Summary for other counseling recommendations.  No Follow-up on file. BPP 4/9 and IOL 4/12  Adam Phenix, MD

## 2016-05-15 NOTE — Telephone Encounter (Signed)
Preadmission screen  

## 2016-05-15 NOTE — Patient Instructions (Signed)
Labor Induction Labor induction is when steps are taken to cause a pregnant woman to begin the labor process. Most women go into labor on their own between 37 weeks and 42 weeks of the pregnancy. When this does not happen or when there is a medical need, methods may be used to induce labor. Labor induction causes a pregnant woman's uterus to contract. It also causes the cervix to soften (ripen), open (dilate), and thin out (efface). Usually, labor is not induced before 39 weeks of the pregnancy unless there is a problem with the baby or mother. Before inducing labor, your health care provider will consider a number of factors, including the following:  The medical condition of you and the baby.  How many weeks along you are.  The status of the baby's lung maturity.  The condition of the cervix.  The position of the baby. What are the reasons for labor induction? Labor may be induced for the following reasons:  The health of the baby or mother is at risk.  The pregnancy is overdue by 1 week or more.  The water breaks but labor does not start on its own.  The mother has a health condition or serious illness, such as high blood pressure, infection, placental abruption, or diabetes.  The amniotic fluid amounts are low around the baby.  The baby is distressed. Convenience or wanting the baby to be born on a certain date is not a reason for inducing labor. What methods are used for labor induction? Several methods of labor induction may be used, such as:  Prostaglandin medicine. This medicine causes the cervix to dilate and ripen. The medicine will also start contractions. It can be taken by mouth or by inserting a suppository into the vagina.  Inserting a thin tube (catheter) with a balloon on the end into the vagina to dilate the cervix. Once inserted, the balloon is expanded with water, which causes the cervix to open.  Stripping the membranes. Your health care provider separates  amniotic sac tissue from the cervix, causing the cervix to be stretched and causing the release of a hormone called progesterone. This may cause the uterus to contract. It is often done during an office visit. You will be sent home to wait for the contractions to begin. You will then come in for an induction.  Breaking the water. Your health care provider makes a hole in the amniotic sac using a small instrument. Once the amniotic sac breaks, contractions should begin. This may still take hours to see an effect.  Medicine to trigger or strengthen contractions. This medicine is given through an IV access tube inserted into a vein in your arm. All of the methods of induction, besides stripping the membranes, will be done in the hospital. Induction is done in the hospital so that you and the baby can be carefully monitored. How long does it take for labor to be induced? Some inductions can take up to 2-3 days. Depending on the cervix, it usually takes less time. It takes longer when you are induced early in the pregnancy or if this is your first pregnancy. If a mother is still pregnant and the induction has been going on for 2-3 days, either the mother will be sent home or a cesarean delivery will be needed. What are the risks associated with labor induction? Some of the risks of induction include:  Changes in fetal heart rate, such as too high, too low, or erratic.  Fetal distress.    Chance of infection for the mother and baby.  Increased chance of having a cesarean delivery.  Breaking off (abruption) of the placenta from the uterus (rare).  Uterine rupture (very rare). When induction is needed for medical reasons, the benefits of induction may outweigh the risks. What are some reasons for not inducing labor? Labor induction should not be done if:  It is shown that your baby does not tolerate labor.  You have had previous surgeries on your uterus, such as a myomectomy or the removal of  fibroids.  Your placenta lies very low in the uterus and blocks the opening of the cervix (placenta previa).  Your baby is not in a head-down position.  The umbilical cord drops down into the birth canal in front of the baby. This could cut off the baby's blood and oxygen supply.  You have had a previous cesarean delivery.  There are unusual circumstances, such as the baby being extremely premature. This information is not intended to replace advice given to you by your health care provider. Make sure you discuss any questions you have with your health care provider. Document Released: 06/18/2006 Document Revised: 07/05/2015 Document Reviewed: 08/26/2012 Elsevier Interactive Patient Education  2017 [employer]  

## 2016-05-15 NOTE — Progress Notes (Signed)
Patient is in the office, reports good fetal movement with contractions that come and go.

## 2016-05-17 ENCOUNTER — Inpatient Hospital Stay (HOSPITAL_COMMUNITY)
Admission: AD | Admit: 2016-05-17 | Discharge: 2016-05-17 | Discharge status: Against Medical Advice | Payer: Medicaid Other | Source: Ambulatory Visit | Attending: Obstetrics and Gynecology | Admitting: Obstetrics and Gynecology

## 2016-05-17 DIAGNOSIS — R079 Chest pain, unspecified: Secondary | ICD-10-CM | POA: Insufficient documentation

## 2016-05-17 DIAGNOSIS — Z5321 Procedure and treatment not carried out due to patient leaving prior to being seen by health care provider: Secondary | ICD-10-CM | POA: Insufficient documentation

## 2016-05-17 DIAGNOSIS — O26893 Other specified pregnancy related conditions, third trimester: Secondary | ICD-10-CM | POA: Diagnosis not present

## 2016-05-17 DIAGNOSIS — Z3A38 38 weeks gestation of pregnancy: Secondary | ICD-10-CM | POA: Insufficient documentation

## 2016-05-17 NOTE — MAU Note (Signed)
Pt reports she woke up having pin in her right chest and back . Feeling SOB (lik I cant catch my breath. Reports she has had a chronic cough since October.Pt is worse when she breaths in ans is in a sitting position. Feels better when she stands up . Good fetal movement reported.Feels like she might be having a panic attack.

## 2016-05-17 NOTE — MAU Note (Signed)
Pt is very anxious. Stated she was afraid to go into treatment room and be"strapped" to the monitor. Just the though makes her feel panicky again and she also did not want the door closed if she went back to a room. Assured her we could leave the door open and if she did not want to be monitored we would not. Agreed to a doppler HR which was 125b/m. Notified Provider pt did not want to go into treatment room and asked for her to bee seen in Triage. Provider talked to pt extensive ly and suggested we get an EKG . Pt refused and stated her chest pain is gone and wants to take a walk to calm down and go home. AMA form signed and instructed pt she could return at any time.

## 2016-05-17 NOTE — MAU Provider Note (Signed)
Brooke Lucas is a 31 y.o. G3P2001 at [redacted]w[redacted]d who presents with chest pain. Pain woke her from her sleep this morning. Pain worse on inspiration. She thinks the pain is indigestion but also feels like she's having a panic attack. Has hx of anxiety, no meds. Patient refuses to go to a room or be placed on fetal monitor. I spoke with her in triage. She states the pain is gone since coming to MAU but now feels very anxious & would like to leave. Patient is signing out AMA.

## 2016-05-19 ENCOUNTER — Ambulatory Visit (HOSPITAL_COMMUNITY)
Admission: RE | Admit: 2016-05-19 | Discharge: 2016-05-19 | Disposition: A | Discharge status: Home / Self Care | Payer: Medicaid Other | Source: Ambulatory Visit | Attending: Obstetrics & Gynecology | Admitting: Obstetrics & Gynecology

## 2016-05-19 ENCOUNTER — Encounter (HOSPITAL_COMMUNITY): Payer: Self-pay

## 2016-05-19 ENCOUNTER — Other Ambulatory Visit (HOSPITAL_COMMUNITY): Payer: Self-pay | Admitting: Obstetrics and Gynecology

## 2016-05-19 ENCOUNTER — Other Ambulatory Visit: Payer: Medicaid Other

## 2016-05-19 DIAGNOSIS — O99333 Smoking (tobacco) complicating pregnancy, third trimester: Secondary | ICD-10-CM | POA: Insufficient documentation

## 2016-05-19 DIAGNOSIS — O09299 Supervision of pregnancy with other poor reproductive or obstetric history, unspecified trimester: Secondary | ICD-10-CM

## 2016-05-19 DIAGNOSIS — F17200 Nicotine dependence, unspecified, uncomplicated: Secondary | ICD-10-CM | POA: Diagnosis not present

## 2016-05-19 DIAGNOSIS — O09293 Supervision of pregnancy with other poor reproductive or obstetric history, third trimester: Secondary | ICD-10-CM | POA: Insufficient documentation

## 2016-05-19 DIAGNOSIS — Z3A38 38 weeks gestation of pregnancy: Secondary | ICD-10-CM | POA: Insufficient documentation

## 2016-05-19 DIAGNOSIS — O0990 Supervision of high risk pregnancy, unspecified, unspecified trimester: Secondary | ICD-10-CM

## 2016-05-21 ENCOUNTER — Other Ambulatory Visit: Payer: Self-pay | Admitting: Obstetrics and Gynecology

## 2016-05-22 ENCOUNTER — Encounter (HOSPITAL_COMMUNITY)
Admission: RE | Disposition: A | Discharge status: Home / Self Care | Payer: Self-pay | Source: Ambulatory Visit | Attending: Obstetrics and Gynecology

## 2016-05-22 ENCOUNTER — Inpatient Hospital Stay (HOSPITAL_COMMUNITY)
Admission: RE | Admit: 2016-05-22 | Discharge: 2016-05-23 | DRG: 767 | Disposition: A | Discharge status: Home / Self Care | Payer: Medicaid Other | Source: Ambulatory Visit | Attending: Obstetrics and Gynecology | Admitting: Obstetrics and Gynecology

## 2016-05-22 ENCOUNTER — Inpatient Hospital Stay (HOSPITAL_COMMUNITY): Payer: Medicaid Other | Admitting: Anesthesiology

## 2016-05-22 ENCOUNTER — Encounter (HOSPITAL_COMMUNITY): Payer: Self-pay

## 2016-05-22 DIAGNOSIS — O9962 Diseases of the digestive system complicating childbirth: Secondary | ICD-10-CM | POA: Diagnosis present

## 2016-05-22 DIAGNOSIS — F17210 Nicotine dependence, cigarettes, uncomplicated: Secondary | ICD-10-CM | POA: Diagnosis present

## 2016-05-22 DIAGNOSIS — Z3A39 39 weeks gestation of pregnancy: Secondary | ICD-10-CM

## 2016-05-22 DIAGNOSIS — Z8249 Family history of ischemic heart disease and other diseases of the circulatory system: Secondary | ICD-10-CM | POA: Diagnosis not present

## 2016-05-22 DIAGNOSIS — K219 Gastro-esophageal reflux disease without esophagitis: Secondary | ICD-10-CM | POA: Diagnosis present

## 2016-05-22 DIAGNOSIS — Z880 Allergy status to penicillin: Secondary | ICD-10-CM | POA: Diagnosis not present

## 2016-05-22 DIAGNOSIS — Z302 Encounter for sterilization: Secondary | ICD-10-CM

## 2016-05-22 DIAGNOSIS — O0990 Supervision of high risk pregnancy, unspecified, unspecified trimester: Secondary | ICD-10-CM

## 2016-05-22 DIAGNOSIS — O99334 Smoking (tobacco) complicating childbirth: Secondary | ICD-10-CM | POA: Diagnosis present

## 2016-05-22 DIAGNOSIS — O9902 Anemia complicating childbirth: Secondary | ICD-10-CM | POA: Diagnosis present

## 2016-05-22 DIAGNOSIS — D573 Sickle-cell trait: Secondary | ICD-10-CM | POA: Diagnosis present

## 2016-05-22 DIAGNOSIS — O99824 Streptococcus B carrier state complicating childbirth: Principal | ICD-10-CM | POA: Diagnosis present

## 2016-05-22 DIAGNOSIS — Z3493 Encounter for supervision of normal pregnancy, unspecified, third trimester: Secondary | ICD-10-CM | POA: Diagnosis present

## 2016-05-22 HISTORY — PX: TUBAL LIGATION: SHX77

## 2016-05-22 LAB — TYPE AND SCREEN
ABO/RH(D): B POS
Antibody Screen: NEGATIVE

## 2016-05-22 LAB — RAPID URINE DRUG SCREEN, HOSP PERFORMED
AMPHETAMINES: NOT DETECTED
Barbiturates: NOT DETECTED
Benzodiazepines: NOT DETECTED
Cocaine: NOT DETECTED
Opiates: NOT DETECTED
TETRAHYDROCANNABINOL: POSITIVE — AB

## 2016-05-22 LAB — CBC
HCT: 28.7 % — ABNORMAL LOW (ref 36.0–46.0)
Hemoglobin: 10.1 g/dL — ABNORMAL LOW (ref 12.0–15.0)
MCH: 30.1 pg (ref 26.0–34.0)
MCHC: 35.2 g/dL (ref 30.0–36.0)
MCV: 85.7 fL (ref 78.0–100.0)
PLATELETS: 268 10*3/uL (ref 150–400)
RBC: 3.35 MIL/uL — AB (ref 3.87–5.11)
RDW: 13.4 % (ref 11.5–15.5)
WBC: 6.6 10*3/uL (ref 4.0–10.5)

## 2016-05-22 LAB — OB RESULTS CONSOLE GBS: STREP GROUP B AG: POSITIVE

## 2016-05-22 LAB — RPR: RPR Ser Ql: NONREACTIVE

## 2016-05-22 LAB — ABO/RH: ABO/RH(D): B POS

## 2016-05-22 SURGERY — LIGATION, FALLOPIAN TUBE, POSTPARTUM
Anesthesia: Epidural

## 2016-05-22 MED ORDER — FENTANYL CITRATE (PF) 100 MCG/2ML IJ SOLN
INTRAMUSCULAR | Status: AC
  Filled 2016-05-22: qty 2

## 2016-05-22 MED ORDER — LIDOCAINE HCL (PF) 1 % IJ SOLN
INTRAMUSCULAR | Status: DC | PRN
  Administered 2016-05-22 (×2): 6 mL via EPIDURAL

## 2016-05-22 MED ORDER — FENTANYL CITRATE (PF) 100 MCG/2ML IJ SOLN: 100.0000 ug | INTRAMUSCULAR | Status: DC | PRN

## 2016-05-22 MED ORDER — PHENYLEPHRINE 40 MCG/ML (10ML) SYRINGE FOR IV PUSH (FOR BLOOD PRESSURE SUPPORT)
80.0000 ug | PREFILLED_SYRINGE | INTRAVENOUS | Status: DC | PRN
  Filled 2016-05-22: qty 10

## 2016-05-22 MED ORDER — OXYCODONE HCL 5 MG PO TABS
5.0000 mg | ORAL_TABLET | ORAL | Status: DC | PRN
  Administered 2016-05-22 – 2016-05-23 (×4): 5 mg via ORAL
  Filled 2016-05-22 (×4): qty 1

## 2016-05-22 MED ORDER — ONDANSETRON HCL 4 MG/2ML IJ SOLN
4.0000 mg | Freq: Four times a day (QID) | INTRAMUSCULAR | Status: DC | PRN
  Administered 2016-05-22: 4 mg via INTRAVENOUS
  Filled 2016-05-22: qty 2

## 2016-05-22 MED ORDER — LACTATED RINGERS IV SOLN
INTRAVENOUS | Status: DC
  Administered 2016-05-22 (×2): via INTRAVENOUS

## 2016-05-22 MED ORDER — FAMOTIDINE 20 MG PO TABS
20.0000 mg | ORAL_TABLET | Freq: Two times a day (BID) | ORAL | Status: DC
  Administered 2016-05-22 – 2016-05-23 (×2): 20 mg via ORAL
  Filled 2016-05-22 (×2): qty 1

## 2016-05-22 MED ORDER — PRENATAL MULTIVITAMIN CH
1.0000 | ORAL_TABLET | Freq: Every day | ORAL | Status: DC
  Administered 2016-05-23: 1 via ORAL
  Filled 2016-05-22: qty 1

## 2016-05-22 MED ORDER — LACTATED RINGERS IV SOLN
INTRAVENOUS | Status: DC | PRN
  Administered 2016-05-22: 17:00:00 via INTRAVENOUS

## 2016-05-22 MED ORDER — DIPHENHYDRAMINE HCL 50 MG/ML IJ SOLN: 12.5000 mg | INTRAMUSCULAR | Status: DC | PRN

## 2016-05-22 MED ORDER — PNEUMOCOCCAL VAC POLYVALENT 25 MCG/0.5ML IJ INJ
0.5000 mL | INJECTION | INTRAMUSCULAR | Status: AC
  Administered 2016-05-23: 0.5 mL via INTRAMUSCULAR
  Filled 2016-05-22 (×2): qty 0.5

## 2016-05-22 MED ORDER — OXYCODONE-ACETAMINOPHEN 5-325 MG PO TABS: 1.0000 | ORAL_TABLET | ORAL | Status: DC | PRN

## 2016-05-22 MED ORDER — MIDAZOLAM HCL 2 MG/2ML IJ SOLN
INTRAMUSCULAR | Status: AC
  Filled 2016-05-22: qty 2

## 2016-05-22 MED ORDER — PHENYLEPHRINE 40 MCG/ML (10ML) SYRINGE FOR IV PUSH (FOR BLOOD PRESSURE SUPPORT): 80.0000 ug | PREFILLED_SYRINGE | INTRAVENOUS | Status: DC | PRN

## 2016-05-22 MED ORDER — FENTANYL 2.5 MCG/ML BUPIVACAINE 1/10 % EPIDURAL INFUSION (WH - ANES)
14.0000 mL/h | INTRAMUSCULAR | Status: DC | PRN
  Administered 2016-05-22: 14 mL/h via EPIDURAL
  Filled 2016-05-22: qty 100

## 2016-05-22 MED ORDER — SIMETHICONE 80 MG PO CHEW: 80.0000 mg | CHEWABLE_TABLET | ORAL | Status: DC | PRN

## 2016-05-22 MED ORDER — DIBUCAINE 1 % RE OINT: 1.0000 "application " | TOPICAL_OINTMENT | RECTAL | Status: DC | PRN

## 2016-05-22 MED ORDER — EPHEDRINE 5 MG/ML INJ: 10.0000 mg | INTRAVENOUS | Status: DC | PRN

## 2016-05-22 MED ORDER — OXYTOCIN 40 UNITS IN LACTATED RINGERS INFUSION - SIMPLE MED
1.0000 m[IU]/min | INTRAVENOUS | Status: DC
  Administered 2016-05-22: 2 m[IU]/min via INTRAVENOUS
  Filled 2016-05-22: qty 1000

## 2016-05-22 MED ORDER — ONDANSETRON HCL 4 MG/2ML IJ SOLN: 4.0000 mg | INTRAMUSCULAR | Status: DC | PRN

## 2016-05-22 MED ORDER — ACETAMINOPHEN 325 MG PO TABS: 650.0000 mg | ORAL_TABLET | ORAL | Status: DC | PRN

## 2016-05-22 MED ORDER — SODIUM BICARBONATE 8.4 % IV SOLN
INTRAVENOUS | Status: AC
  Filled 2016-05-22: qty 50

## 2016-05-22 MED ORDER — OXYCODONE-ACETAMINOPHEN 5-325 MG PO TABS: 2.0000 | ORAL_TABLET | ORAL | Status: DC | PRN

## 2016-05-22 MED ORDER — ZOLPIDEM TARTRATE 5 MG PO TABS: 5.0000 mg | ORAL_TABLET | Freq: Every evening | ORAL | Status: DC | PRN

## 2016-05-22 MED ORDER — TETANUS-DIPHTH-ACELL PERTUSSIS 5-2.5-18.5 LF-MCG/0.5 IM SUSP: 0.5000 mL | Freq: Once | INTRAMUSCULAR | Status: DC

## 2016-05-22 MED ORDER — COCONUT OIL OIL: 1.0000 "application " | TOPICAL_OIL | Status: DC | PRN

## 2016-05-22 MED ORDER — KETOROLAC TROMETHAMINE 30 MG/ML IJ SOLN
INTRAMUSCULAR | Status: DC | PRN
  Administered 2016-05-22: 30 mg via INTRAVENOUS

## 2016-05-22 MED ORDER — LIDOCAINE-EPINEPHRINE (PF) 2 %-1:200000 IJ SOLN
INTRAMUSCULAR | Status: DC | PRN
  Administered 2016-05-22: 5 mL via EPIDURAL
  Administered 2016-05-22: 3 mL via EPIDURAL
  Administered 2016-05-22: 10 mL via EPIDURAL

## 2016-05-22 MED ORDER — KETOROLAC TROMETHAMINE 30 MG/ML IJ SOLN
INTRAMUSCULAR | Status: AC
  Filled 2016-05-22: qty 1

## 2016-05-22 MED ORDER — BUPIVACAINE HCL (PF) 0.25 % IJ SOLN
INTRAMUSCULAR | Status: DC | PRN
Start: 2016-05-22 — End: 2016-05-22
  Administered 2016-05-22: 30 mL

## 2016-05-22 MED ORDER — OXYTOCIN BOLUS FROM INFUSION: 500.0000 mL | Freq: Once | INTRAVENOUS | Status: DC

## 2016-05-22 MED ORDER — LACTATED RINGERS IV SOLN: 500.0000 mL | INTRAVENOUS | Status: DC | PRN

## 2016-05-22 MED ORDER — FLEET ENEMA 7-19 GM/118ML RE ENEM: 1.0000 | ENEMA | RECTAL | Status: DC | PRN

## 2016-05-22 MED ORDER — SOD CITRATE-CITRIC ACID 500-334 MG/5ML PO SOLN
30.0000 mL | ORAL | Status: DC | PRN
  Administered 2016-05-22: 30 mL via ORAL
  Filled 2016-05-22: qty 15

## 2016-05-22 MED ORDER — LIDOCAINE HCL (PF) 1 % IJ SOLN
30.0000 mL | INTRAMUSCULAR | Status: DC | PRN
  Filled 2016-05-22: qty 30

## 2016-05-22 MED ORDER — VANCOMYCIN HCL IN DEXTROSE 1-5 GM/200ML-% IV SOLN
1000.0000 mg | Freq: Two times a day (BID) | INTRAVENOUS | Status: DC
  Administered 2016-05-22: 1000 mg via INTRAVENOUS
  Filled 2016-05-22 (×2): qty 200

## 2016-05-22 MED ORDER — OXYTOCIN 40 UNITS IN LACTATED RINGERS INFUSION - SIMPLE MED: 2.5000 [IU]/h | INTRAVENOUS | Status: DC

## 2016-05-22 MED ORDER — TERBUTALINE SULFATE 1 MG/ML IJ SOLN: 0.2500 mg | Freq: Once | INTRAMUSCULAR | Status: DC | PRN

## 2016-05-22 MED ORDER — LACTATED RINGERS IV SOLN: 500.0000 mL | Freq: Once | INTRAVENOUS | Status: DC

## 2016-05-22 MED ORDER — ACETAMINOPHEN 325 MG PO TABS
650.0000 mg | ORAL_TABLET | ORAL | Status: DC | PRN
  Administered 2016-05-22: 650 mg via ORAL
  Filled 2016-05-22: qty 2

## 2016-05-22 MED ORDER — BENZOCAINE-MENTHOL 20-0.5 % EX AERO: 1.0000 "application " | INHALATION_SPRAY | CUTANEOUS | Status: DC | PRN

## 2016-05-22 MED ORDER — MIDAZOLAM HCL 5 MG/5ML IJ SOLN
INTRAMUSCULAR | Status: DC | PRN
  Administered 2016-05-22 (×2): 1 mg via INTRAVENOUS

## 2016-05-22 MED ORDER — IBUPROFEN 600 MG PO TABS
600.0000 mg | ORAL_TABLET | Freq: Four times a day (QID) | ORAL | Status: DC
  Administered 2016-05-22 – 2016-05-23 (×3): 600 mg via ORAL
  Filled 2016-05-22 (×3): qty 1

## 2016-05-22 MED ORDER — SENNOSIDES-DOCUSATE SODIUM 8.6-50 MG PO TABS
2.0000 | ORAL_TABLET | ORAL | Status: DC
  Administered 2016-05-22: 2 via ORAL
  Filled 2016-05-22: qty 2

## 2016-05-22 MED ORDER — DIPHENHYDRAMINE HCL 25 MG PO CAPS: 25.0000 mg | ORAL_CAPSULE | Freq: Four times a day (QID) | ORAL | Status: DC | PRN

## 2016-05-22 MED ORDER — BUPIVACAINE HCL (PF) 0.25 % IJ SOLN
INTRAMUSCULAR | Status: AC
  Filled 2016-05-22: qty 30

## 2016-05-22 MED ORDER — FENTANYL CITRATE (PF) 100 MCG/2ML IJ SOLN
INTRAMUSCULAR | Status: DC | PRN
  Administered 2016-05-22 (×2): 50 ug via INTRAVENOUS

## 2016-05-22 MED ORDER — DIPHENHYDRAMINE HCL 50 MG/ML IJ SOLN
12.5000 mg | INTRAMUSCULAR | Status: DC | PRN
Start: 2016-05-22 — End: 2016-05-22

## 2016-05-22 MED ORDER — LIDOCAINE-EPINEPHRINE (PF) 2 %-1:200000 IJ SOLN
INTRAMUSCULAR | Status: AC
  Filled 2016-05-22: qty 20

## 2016-05-22 MED ORDER — MISOPROSTOL 25 MCG QUARTER TABLET
25.0000 ug | ORAL_TABLET | ORAL | Status: DC | PRN
  Administered 2016-05-22: 25 ug via VAGINAL
  Filled 2016-05-22: qty 1

## 2016-05-22 MED ORDER — WITCH HAZEL-GLYCERIN EX PADS: 1.0000 "application " | MEDICATED_PAD | CUTANEOUS | Status: DC | PRN

## 2016-05-22 MED ORDER — ONDANSETRON HCL 4 MG PO TABS: 4.0000 mg | ORAL_TABLET | ORAL | Status: DC | PRN

## 2016-05-22 SURGICAL SUPPLY — 21 items
CLOTH BEACON ORANGE TIMEOUT ST (SAFETY) ×3 IMPLANT
DRSG OPSITE POSTOP 3X4 (GAUZE/BANDAGES/DRESSINGS) ×3 IMPLANT
DURAPREP 26ML APPLICATOR (WOUND CARE) ×3 IMPLANT
GLOVE BIO SURGEON STRL SZ7.5 (GLOVE) ×3 IMPLANT
GLOVE BIOGEL PI IND STRL 7.0 (GLOVE) ×2 IMPLANT
GLOVE BIOGEL PI INDICATOR 7.0 (GLOVE) ×4
GOWN STRL REUS W/TWL LRG LVL3 (GOWN DISPOSABLE) ×3 IMPLANT
GOWN STRL REUS W/TWL XL LVL3 (GOWN DISPOSABLE) ×3 IMPLANT
NEEDLE HYPO 22GX1.5 SAFETY (NEEDLE) ×3 IMPLANT
NS IRRIG 1000ML POUR BTL (IV SOLUTION) ×3 IMPLANT
PACK ABDOMINAL MINOR (CUSTOM PROCEDURE TRAY) ×3 IMPLANT
PROTECTOR NERVE ULNAR (MISCELLANEOUS) ×3 IMPLANT
SPONGE LAP 4X18 X RAY DECT (DISPOSABLE) IMPLANT
SUT MNCRL AB 4-0 PS2 18 (SUTURE) ×3 IMPLANT
SUT PLAIN 0 NONE (SUTURE) ×3 IMPLANT
SUT VIC AB 0 CT1 27 (SUTURE) ×3
SUT VIC AB 0 CT1 27XBRD ANBCTR (SUTURE) ×1 IMPLANT
SYR CONTROL 10ML LL (SYRINGE) ×3 IMPLANT
TOWEL OR 17X24 6PK STRL BLUE (TOWEL DISPOSABLE) ×6 IMPLANT
TRAY FOLEY CATH SILVER 14FR (SET/KITS/TRAYS/PACK) ×3 IMPLANT
WATER STERILE IRR 1000ML POUR (IV SOLUTION) ×3 IMPLANT

## 2016-05-22 NOTE — H&P (Signed)
I confirm that I have verified the information documented in the resident's note and that I have also personally reperformed the physical exam and all medical decision making activities. Kenard Gower, CNM 05/22/2016 10:15 AM   LABOR AND DELIVERY ADMISSION HISTORY AND PHYSICAL NOTE  Brooke Lucas is a 31 y.o. female G3P2001 with IUP at [redacted]w[redacted]d by LMP presenting for IOL.Patien has a history of IUFD. Cytotec started on admission. Patient is GBS positive and will be started on vancomycin due to allergy to penicillin.  She reports positive fetal movement. She denies leakage of fluid or vaginal bleeding.  Prenatal History/Complications:  Past Medical History: Past Medical History:  Diagnosis Date  . Anxiety   . Depression    after loss of son  . Sickle cell trait (HCC)   . Trichomonas     Past Surgical History: Past Surgical History:  Procedure Laterality Date  . extraction wisdom      Obstetrical History: OB History    Gravida Para Term Preterm AB Living   0 0 1   SAB TAB Ectopic Multiple Live Births   0 0 0 0 1      Social History: Social History   Social History  . Marital status: Single    Spouse name: N/A  . Number of children: N/A  . Years of education: N/A   Social History Main Topics  . Smoking status: Current Every Day Smoker    Packs/day: 0.25    Years: 0.00    Types: Cigarettes  . Smokeless tobacco: Never Used     Comment: 1/2 pack per day  . Alcohol use No     Comment: Socially  . Drug use: No  . Sexual activity: Yes    Partners: Male    Birth control/ protection: None   Other Topics Concern  . Not on file   Social History Narrative  . No narrative on file    Family History: Family History  Problem Relation Age of Onset  . Cancer Brother   . Birth defects Son     heart  . Heart disease Maternal Grandmother   . Arthritis Paternal Grandmother   . Other Neg Hx     Allergies: Allergies  Allergen Reactions  . Amoxicillin  Anaphylaxis  . Penicillins Anaphylaxis  . Benadryl [Diphenhydramine] Anxiety    Prescriptions Prior to Admission  Medication Sig Dispense Refill Last Dose  . ondansetron (ZOFRAN) 4 MG tablet Take 1 tablet (4 mg total) by mouth every 8 (eight) hours as needed for nausea or vomiting. (Patient not taking: Reported on 05/19/2016) 20 tablet 0 Not Taking  . Prenat-FeCbn-FeAspGl-FA-Omega (OB COMPLETE PETITE) 35-5-1-200 MG CAPS Take 1 capsule by mouth daily before breakfast. 90 capsule 3 Taking  . ranitidine (ZANTAC) 150 MG tablet Take 1 tablet (150 mg total) by mouth 2 (two) times daily. 60 tablet 5 Taking  . terconazole (TERAZOL 7) 0.4 % vaginal cream Place 1 applicator vaginally at bedtime. (Patient not taking: Reported on 05/15/2016) 45 g 0 Not Taking     Review of Systems   All systems reviewed and negative except as stated in HPI  Last menstrual period 08/23/2015. General appearance: alert, cooperative and appears stated age Lungs: Normal effort and no audible Wheezing Heart: regular rate and pulses are palpated distally Abdomen: soft, non-tender, Extremities: No calf swelling or tenderness Presentation: cephalic by nurse exam Fetal monitoring: FHR 130 Uterine activity: irregular contractions      Prenatal labs: ABO, Rh:  B/Positive/-- (09/19 1340) Antibody: Negative (09/19 1340) Rubella: Immune RPR: Non Reactive (01/30 1015)  HBsAg: Negative (09/19 1340)  HIV: Non Reactive (01/30 1015)  GBS: Positive (03/22 1118)  1 hr Glucola: within normal limit Genetic screening: Normal Anatomy US: Normal   Prenatal Transfer Tool  Maternal Diabetes: No Genetic Screening: Normal Maternal Ultrasounds/Referrals: Normal Fetal Ultrasounds or other Referrals:  None Maternal Substance Abuse:  No Significant Maternal Medications:  None Significant Maternal Lab Results: Lab values include: Group B Strep positive  No results found for this or any previous visit (from the past 24  hour(s)).  Patient Active Problem List   Diagnosis Date Noted  . Unwanted fertility 04/24/2016  . Prior pregnancy with fetal demise 03/25/2016  . Supervision of high risk pregnancy, antepartum 10/28/2015    Assessment: Brooke Lucas is a 31 y.o. G3P2001 at [redacted]w[redacted]d here for IOL. Patient with history of fetal demise. On admission, patient is doing well, fetal movement appreciated, pain is well controlled, mild contractions.  #Labor: Augmentation of labor with cytotec and pitocin #Pain: IV pain med, epidural #FWB:  Category 1  #ID:  GBS+ tx w/vanc #MOF: Bottle #MOC: BTL #Circ:  Outpatient  Abdoulaye Diallo 05/22/2016, 7:14 AM

## 2016-05-22 NOTE — Anesthesia Postprocedure Evaluation (Signed)
Anesthesia Post Note  Patient: Brooke Lucas  Procedure(s) Performed: Procedure(s) (LRB): POST PARTUM TUBAL LIGATION (N/A)  Patient location during evaluation: PACU Anesthesia Type: Epidural Level of consciousness: awake and alert Pain management: pain level controlled Vital Signs Assessment: post-procedure vital signs reviewed and stable Respiratory status: spontaneous breathing, nonlabored ventilation and respiratory function stable Cardiovascular status: stable and blood pressure returned to baseline Anesthetic complications: no        Last Vitals:  Vitals:   05/22/16 1835 05/22/16 1940  BP: 127/70 131/72  Pulse: 78 73  Resp: 16 18  Temp: 36.9 C 36.7 C    Last Pain:  Vitals:   05/22/16 2027  TempSrc:   PainSc: 7    Pain Goal: Patients Stated Pain Goal: 3 (05/22/16 1730)               Lowella Curb

## 2016-05-22 NOTE — Anesthesia Preprocedure Evaluation (Signed)
Anesthesia Evaluation  Patient identified by MRN, date of birth, ID band Patient awake    Reviewed: Allergy & Precautions, NPO status , Patient's Chart, lab work & pertinent test results  History of Anesthesia Complications Negative for: history of anesthetic complications  Airway Mallampati: IV  TM Distance: >3 FB Neck ROM: Full    Dental  (+) Dental Advisory Given   Pulmonary Current Smoker,    breath sounds clear to auscultation       Cardiovascular negative cardio ROS   Rhythm:Regular Rate:Normal     Neuro/Psych negative neurological ROS     GI/Hepatic Neg liver ROS, GERD  Poorly Controlled,  Endo/Other  negative endocrine ROS  Renal/GU negative Renal ROS     Musculoskeletal   Abdominal   Peds  Hematology  (+) Sickle cell trait , Hb 10.1, plt 268k   Anesthesia Other Findings   Reproductive/Obstetrics (+) Pregnancy                             Anesthesia Physical Anesthesia Plan  ASA: II  Anesthesia Plan: Epidural   Post-op Pain Management:    Induction:   Airway Management Planned: Natural Airway  Additional Equipment:   Intra-op Plan:   Post-operative Plan:   Informed Consent: I have reviewed the patients History and Physical, chart, labs and discussed the procedure including the risks, benefits and alternatives for the proposed anesthesia with the patient or authorized representative who has indicated his/her understanding and acceptance.   Dental advisory given  Plan Discussed with:   Anesthesia Plan Comments: (Patient identified. Risks/Benefits/Options discussed with patient including but not limited to bleeding, infection, nerve damage, paralysis, failed block, incomplete pain control, headache, blood pressure changes, nausea, vomiting, reactions to medication both or allergic, itching and postpartum back pain. Confirmed with bedside nurse the patient's most  recent platelet count. Confirmed with patient that they are not currently taking any anticoagulation, have any bleeding history or any family history of bleeding disorders. Patient expressed understanding and wished to proceed. All questions were answered. )        Anesthesia Quick Evaluation

## 2016-05-22 NOTE — Transfer of Care (Signed)
Immediate Anesthesia Transfer of Care Note  Patient: Brooke Lucas  Procedure(s) Performed: Procedure(s): POST PARTUM TUBAL LIGATION (N/A)  Patient Location: PACU  Anesthesia Type:Epidural  Level of Consciousness: awake, alert , oriented and patient cooperative  Airway & Oxygen Therapy: Patient Spontanous Breathing  Post-op Assessment: Report given to RN and Post -op Vital signs reviewed and stable  Post vital signs: Reviewed and stable  Last Vitals:  Vitals:   05/22/16 1616 05/22/16 1722  BP: (!) 77/55 (!) 109/59  Pulse: 93 84  Resp:  15  Temp:      Last Pain:  Vitals:   05/22/16 1722  TempSrc:   PainSc: 0-No pain         Complications: No apparent anesthesia complications

## 2016-05-22 NOTE — Anesthesia Pain Management Evaluation Note (Signed)
  CRNA Pain Management Visit Note  Patient: Brooke Lucas, 30 y.o., female  "Hello I am a member of the anesthesia team at [facility]. We have an anesthesia team available at all times to provide care throughout the hospital, including epidural management and anesthesia for C-section. I don't know your plan for the delivery whether it a natural birth, water birth, IV sedation, nitrous supplementation, doula or epidural, but we want to meet your pain goals."   1.Was your pain managed to your expectations on prior hospitalizations?   Yes   2.What is your expectation for pain management during this hospitalization?     Epidural  3.How can we help you reach that goal? epidural  Record the patient's initial score and the patient's pain goal.   Pain: 7  Pain Goal: 4 [facility] wants you to be able to say your pain was always managed very well.  Madison Hickman 05/22/2016

## 2016-05-22 NOTE — Progress Notes (Signed)
Spoke with Dr. Redmond Baseman regarding pt's request to continue zantac for reflux. Dr. Redmond Baseman to place med order.

## 2016-05-22 NOTE — Op Note (Signed)
  05/22/2016  PREOPERATIVE DIAGNOSIS:  Undesired fertility  POSTOPERATIVE DIAGNOSIS:  Undesired fertility  PROCEDURE:  Postpartum Bilateral Tubal Sterilization using Pomeroy method   ANESTHESIA:  Epidural  COMPLICATIONS:  None immediate.  ESTIMATED BLOOD LOSS:  Less than 20cc.  INDICATIONS: 31 y.o. yo [id number]  with undesired fertility,status post vaginal delivery, desires permanent sterilization. Risks and benefits of procedure discussed with patient including permanence of method, bleeding, infection, injury to surrounding organs and need for additional procedures. Risk failure of 0.5-1% with increased risk of ectopic gestation if pregnancy occurs was also discussed with patient.   FINDINGS:  Normal uterus, tubes, and ovaries.  TECHNIQUE: After informed consent was obtained, the patient was taken to the operating room where anesthesia was induced and found to be adequate. A small transverse, infraumbilical skin incision was made with the scalpel. This incision was carried down to the underlying layer of fascia. The fascia was grasped with Kocher clamps tented up and entered sharply with Mayo scissors. Underlying peritoneum was then identified tented up and entered sharply with Metzenbaum scissors. The patient's left fallopian tube was then identified, brought to the incision, and grasped with a Babcock clamp. The tube was then followed out to the fimbria. The Babcock clamp was then used to grasp the tube approximately 4 cm from the cornual region. A 3 cm segment of the tube was then ligated with free tie of plain gut suture, transected and excised. Good hemostasis was noted and the tube was returned to the abdomen. The right fallopian tube was then identified to its fimbriated end, ligated, and a 3 cm segment excised in a similar fashion. Excellent hemostasis was noted, and the tube returned to the abdomen. The fascia was re-approximated with 0 Vicryl. The skin was closed in a subcuticular fashion  with 3-0 Vicryl. Quarter percent Marcaine solution was then injected at the incision site. The patient tolerated the procedure well. Sponge, lap, and needle count were correct x2. The patient was taken to recovery room in stable condition.  Ernestina Penna, MD 05/22/16 5:28 PM

## 2016-05-22 NOTE — Progress Notes (Addendum)
LABOR PROGRESS NOTE  Brooke Lucas is a 31 y.o. G3P2001 at [redacted]w[redacted]d  admitted for IOL for a history of IUFd.  Subjective: Patient is comfortable at the time, she is feeling contractions and endorses fetal movement. Pain level is well controlled at the moment.  Objective: BP 121/78   Pulse 67   Temp 98.9 F (37.2 C) (Oral)   Resp 18   Ht  (1.702 m)   Wt 196 lb (88.9 kg)   LMP 08/23/2015 (Approximate)   SpO2 100%   BMI 30.70 kg/m  or  Vitals:   05/22/16 1301 05/22/16 1331 05/22/16 1334 05/22/16 1400  BP: 127/69 125/76  121/78  Pulse: 70 73  67  Resp:      Temp:   98.9 F (37.2 C)   TempSrc:   Oral   SpO2:      Weight:      Height:         Dilation: 3 Effacement (%): 50 Station: -2 Presentation: Vertex Exam by:: J.Cox, RN  Labs: Lab Results  Component Value Date   WBC 6.6 05/22/2016   HGB 10.1 (L) 05/22/2016   HCT 28.7 (L) 05/22/2016   MCV 85.7 05/22/2016   PLT 268 05/22/2016    Patient Active Problem List   Diagnosis Date Noted  . Unwanted fertility 04/24/2016  . Prior pregnancy with fetal demise 03/25/2016  . Supervision of high risk pregnancy, antepartum 10/28/2015    Assessment / Plan: 31 y.o. G3P2001 at [redacted]w[redacted]d here for IOL for a history of IUFD. Baby is still ballotable will check in 2 hours before considering AROM.  Labor: augmentation with pitocin Fetal Wellbeing: Category 1 Pain Control:  IV pain meds Anticipated MOD:  Vaginal  Brooke Neighbours, MD 05/22/2016, 2:08 PM

## 2016-05-22 NOTE — Anesthesia Procedure Notes (Signed)
Epidural Patient location during procedure: OB Start time: 05/22/2016 11:33 AM End time: 05/22/2016 11:57 AM  Staffing Anesthesiologist: Jairo Ben Performed: anesthesiologist   Preanesthetic Checklist Completed: patient identified, surgical consent, pre-op evaluation, timeout performed, IV checked, risks and benefits discussed and monitors and equipment checked  Epidural Patient position: sitting Prep: site prepped and draped and DuraPrep Patient monitoring: blood pressure, continuous pulse ox and heart rate Approach: midline Location: L3-L4 Injection technique: LOR air  Needle:  Needle type: Tuohy  Needle gauge: 17 G Needle length: 9 cm Needle insertion depth: 5 cm Catheter type: closed end flexible Catheter size: 19 Gauge Catheter at skin depth: 10 cm Test dose: negative (1% lidocaine)  Assessment Events: blood not aspirated, injection not painful, no injection resistance, negative IV test and no paresthesia  Additional Notes Pt identified in Labor room.  Monitors applied. Working IV access confirmed. Sterile prep, drape lumbar spine.  1% lido local L 3,4.  #17ga Touhy LOR air at 5 cm L 3,4, cath in easily to 10 cm skin. Test dose OK, cath dosed and infusion begun.  Patient asymptomatic, VSS, no heme aspirated, tolerated well.  Sandford Craze, MDReason for block:procedure for pain

## 2016-05-23 MED ORDER — IBUPROFEN 600 MG PO TABS: 600.0000 mg | ORAL_TABLET | Freq: Four times a day (QID) | ORAL | 0 refills | Status: DC

## 2016-05-23 MED ORDER — OXYCODONE-ACETAMINOPHEN 5-325 MG PO TABS: 1.0000 | ORAL_TABLET | Freq: Four times a day (QID) | ORAL | 0 refills | Status: DC | PRN

## 2016-05-23 MED ORDER — OXYCODONE HCL 5 MG PO TABS: 5.0000 mg | ORAL_TABLET | ORAL | 0 refills | Status: DC | PRN

## 2016-05-23 NOTE — Plan of Care (Signed)
Problem: Activity: Goal: Will verbalize the importance of balancing activity with adequate rest periods Outcome: Completed/Met Date Met: 05/23/16 Patient ambulating in hallway frequently.

## 2016-05-23 NOTE — Addendum Note (Signed)
Addendum  created 05/23/16 0757 by Rica Records, CRNA   Charge Capture section accepted, Sign clinical note

## 2016-05-23 NOTE — Clinical Social Work Maternal (Signed)
CLINICAL SOCIAL WORK MATERNAL/CHILD NOTE  Patient Details  Name: Brooke Lucas MRN: [id number] Date of Birth: Jan 28, 1986  Date:  05/23/2016  Clinical Social Worker Initiating Note:  Terri Piedra, [place] Date/ Time Initiated:  05/23/16/0940     Child's Name:  Shaaron Adler   Legal Guardian:  Other (Comment) (Parents: Lesia Sago and Murriel Hopper)   Need for Interpreter:  None   Date of Referral:  05/23/16     Reason for Referral:  Current Substance Use/Substance Use During Pregnancy , Other (Comment) (hx Anx/Dep related to hx of term IUFD)   Referral Source:  [facility]   Address:  [address] [place], [place] [address]  Phone number:  [phone]   Household Members:  Significant Other, Minor Children (MOB has an 61 year old son: [place])   Natural Supports (not living in the home):  Friends, Immediate Family, Extended Family, Spouse/significant other   Professional Supports: Transport planner (MOB states she sees Lenon Curt through [employer])   Employment:     Type of Work:     Education:      Museum/gallery curator Resources:  [employer]   Other Resources:      Cultural/Religious Considerations Which May Impact Care: None stated.  MOB's facesheet notes religion as Psychologist, forensic.  Strengths:  Ability to meet basic needs , Pediatrician chosen , Home prepared for child  ([facility])   Risk Factors/Current Problems:  Mental Health Concerns , Substance Use    Cognitive State:  Able to Concentrate , Alert , Linear Thinking , Insightful , Goal Oriented    Mood/Affect:  Calm , Interested    CSW Assessment: CSW met with MOB in her first floor room/155 to offer support and complete assessment due to marijuana use and Anx/Dep.  MOB was extremely pleasant and receptive to CSW's intervention.  CSW found MOB to be easy to engage. MOB reports that she and baby are doing well.  When asked how many children she has, MOB answered that she has an 17 year  old living son and a son who was a stillborn who would be 9 this year.  CSW thanked MOB for telling CSW about all of her children.  MOB was open to talking about the grief, anxiety and depression she dealt with/deals with after the loss of her second son.  She reports feeling "happy" at this time and describes this baby as "a blessing."  She states that symptoms of anxiety were increased during pregnancy due to her hx, but states she spoke with a counselor at her OB appointments, which she found helpful.  She plans to continue seeing her counselor at [facility].  She states she was prescribed Zoloft after her baby died, but she did not like the way it made her feel, so she stopped taking it.  She admits to using marijuana to cope with her emotions, but states she stopped smoking in November 2017, but remained around others who smoked.  She states the smell of marijuana helped her have an appetite during pregnancy.  CSW informed MOB of hospital drug screen policy and mandated reporting to CPS.  CSW also informed MOB of her positive screen on admission and baby's positive UDS.  MOB was appropriately concerned and asked CSW what to expect from CPS involvement.  She denies hx of CPS involvement.  CSW discussed what she might expect and informed her that she will receive a call from a CPS worker, however, CPS involvement will not delay discharge from  the hospital.  She understands baby's umbilical cord is also tested and denies all other substance use. MOB was able to identify positive coping mechanisms she utilizes to calm down and reports often waking up feeling panicked.  She states cool air and sitting still helps her calm down, as well as mindfulness and grounding exercises.  She states she will "think about the big picture" to remember that her feelings do not just affect herself, but her children as well.  She notes that her children are motivation for her to not allow herself to drown in her  emotions.  CSW provided education regarding PMADs, which MOB stated interest in and was attentive to.  CSW discussed Baby Blues vs PMADs and gave MOB information for support groups held at [facility] as well as a new mom checklist as a way to self-evaluate in the postpartum time period.  MOB was extremely appreciative and extended her hand to thank CSW for the visit and support offered.   MOB reports that she has all necessary items for baby at home and was aware of SIDS precautions as reviewed by CSW.   CSW made report to [place] due to baby's positive UDS for marijuana.  CSW will monitor CDS results and follow up accordingly.  CSW Plan/Description:  Child Protective Service Report , Information/Referral to [employer] , No Further Intervention Required/No Barriers to Discharge, Patient/Family Education     Alphonzo Cruise, [place] 05/23/2016, 10:45 AM

## 2016-05-23 NOTE — Plan of Care (Signed)
Problem: Education: Goal: Knowledge of condition will improve Discharge education reviewed with patient. Patient verbalized understanding of information.   

## 2016-05-23 NOTE — Anesthesia Postprocedure Evaluation (Signed)
Anesthesia Post Note  Patient: Brooke Lucas  Procedure(s) Performed: Procedure(s) (LRB): POST PARTUM TUBAL LIGATION (N/A)  Patient location during evaluation: Mother Baby Anesthesia Type: Epidural Level of consciousness: awake and alert Pain management: pain level controlled Vital Signs Assessment: post-procedure vital signs reviewed and stable Respiratory status: spontaneous breathing, nonlabored ventilation and respiratory function stable Cardiovascular status: stable Postop Assessment: no headache, no backache, epidural receding and patient able to bend at knees Anesthetic complications: no        Last Vitals:  Vitals:   05/22/16 2323 05/23/16 0345  BP: 120/64 122/68  Pulse: 66 67  Resp: 18 18  Temp: 36.3 C 36.7 C    Last Pain:  Vitals:   05/23/16 0624  TempSrc:   PainSc: 8    Pain Goal: Patients Stated Pain Goal: 3 (05/22/16 1730)               Rica Records

## 2016-05-23 NOTE — Anesthesia Postprocedure Evaluation (Signed)
Anesthesia Post Note  Patient: Brooke Lucas  Procedure(s) Performed: Procedure(s) (LRB): POST PARTUM TUBAL LIGATION (N/A)  Anesthesia Type: Epidural        Last Vitals:  Vitals:   05/22/16 2323 05/23/16 0345  BP: 120/64 122/68  Pulse: 66 67  Resp: 18 18  Temp: 36.3 C 36.7 C    Last Pain:  Vitals:   05/23/16 0624  TempSrc:   PainSc: 8    Pain Goal: Patients Stated Pain Goal: 3 (05/22/16 1730)               Rica Records

## 2016-05-23 NOTE — Discharge Summary (Signed)
OB Discharge Summary  Patient Name: Brooke Lucas DOB: [birth year] MRN: [id number]  Date of admission: 05/22/2016 Delivering MD: [name]   Date of discharge: 05/23/2016  Admitting diagnosis: INDUCTION Intrauterine pregnancy: [redacted]w[redacted]d     Secondary diagnosis:Active Problems:   * No active hospital problems. *  Additional problems:h/o IUFD     Discharge diagnosis: Term Pregnancy Delivered                                                                     Post partum procedures:postpartum tubal ligation  Augmentation: Cytotec  Complications: None  Hospital course:  Induction of Labor With Vaginal Delivery   31 y.o. yo [id number] at [redacted]w[redacted]d was admitted to the hospital 05/22/2016 for induction of labor.  Indication for induction: h/o IUFD.  Patient had an uncomplicated labor course as follows: Membrane Rupture Time/Date: 3:07 PM ,05/22/2016   Intrapartum Procedures: Episiotomy: None [1]                                         Lacerations:  None [1]  Patient had delivery of a Viable infant.  Information for the patient's newborn:  Awanda, Wilcock [[id number]]  Delivery Method: Vaginal, Spontaneous Delivery (Filed from Delivery Summary)   05/22/2016  Details of delivery can be found in separate delivery note.  Patient had a routine postpartum course. Patient is discharged home 05/23/16.  Physical exam  Vitals:   05/22/16 1835 05/22/16 1940 05/22/16 2323 05/23/16 0345  BP: 127/70 131/72 120/64 122/68  Pulse: 78 73 66 67  Resp: Temp: 98.4 F (36.9 C) 98.1 F (36.7 C) 97.3 F (36.3 C) 98.1 F (36.7 C)  TempSrc:  Axillary Axillary Axillary  SpO2: 100% 100% 99% 100%  Weight:      Height:       General: alert Lochia: appropriate Uterine Fundus: firm Incision: Dressing is clean, dry, and intact DVT Evaluation: No evidence of DVT seen on physical exam. Labs: Lab Results  Component Value Date   WBC 6.6 05/22/2016   HGB 10.1 (L) 05/22/2016   HCT  28.7 (L) 05/22/2016   MCV 85.7 05/22/2016   PLT 268 05/22/2016   CMP Latest Ref Rng & Units 02/08/2016  Glucose 65 - 99 mg/dL 86  BUN 6 - 20 mg/dL <1(H)  Creatinine 0.86 - 1.00 mg/dL 5.78  Sodium 469 - 629 mmol/L 135  Potassium 3.5 - 5.1 mmol/L 3.4(L)  Chloride 101 - 111 mmol/L 105  CO2 22 - 32 mmol/L 21(L)  Calcium 8.9 - 10.3 mg/dL 5.2(W)  Total Protein 6.5 - 8.1 g/dL 6.1(L)  Total Bilirubin 0.3 - 1.2 mg/dL 0.8  Alkaline Phos 38 - 126 U/L 41  AST 15 - 41 U/L 19  ALT 14 - 54 U/L 15    Discharge instruction: per After Visit Summary and "Baby and Me Booklet".  After Visit Meds:  Allergies as of 05/23/2016      Reactions   Amoxicillin Anaphylaxis   Has patient had a PCN reaction causing immediate rash, facial/tongue/throat swelling, SOB or lightheadedness with hypotension: Yes Has patient had a PCN reaction causing  severe rash involving mucus membranes or skin necrosis: Yes Has patient had a PCN reaction that required hospitalization No Has patient had a PCN reaction occurring within the last 10 years: No If all of the above answers are "NO", then may proceed with Cephalosporin use.   Penicillins Anaphylaxis   Has patient had a PCN reaction causing immediate rash, facial/tongue/throat swelling, SOB or lightheadedness with hypotension: Yes Has patient had a PCN reaction causing severe rash involving mucus membranes or skin necrosis: Yes Has patient had a PCN reaction that required hospitalization No Has patient had a PCN reaction occurring within the last 10 years: No If all of the above answers are "NO", then may proceed with Cephalosporin use.   Benadryl [diphenhydramine] Anxiety      Medication List    STOP taking these medications   ondansetron 4 MG tablet Commonly known as:  ZOFRAN   terconazole 0.4 % vaginal cream Commonly known as:  TERAZOL 7     TAKE these medications   ibuprofen 600 MG tablet Commonly known as:  ADVIL,MOTRIN Take 1 tablet (600 mg total) by  mouth every 6 (six) hours.   OB COMPLETE PETITE 35-5-1-200 MG Caps Take 1 capsule by mouth daily before breakfast.   oxyCODONE 5 MG immediate release tablet Commonly known as:  Oxy IR/ROXICODONE Take 1 tablet (5 mg total) by mouth every 3 (three) hours as needed for moderate pain or severe pain.   ranitidine 150 MG tablet Commonly known as:  ZANTAC Take 1 tablet (150 mg total) by mouth 2 (two) times daily.       Diet: routine diet  Activity: Advance as tolerated. Pelvic rest for 6 weeks.   Outpatient follow up:6 weeks Follow up Appt:No future appointments. Follow up visit: No Follow-up on file.  Postpartum contraception: Tubal Ligation  Newborn Data: Live born female  Birth Weight: 7 lb 0.9 oz (3200 g) APGAR: 8, 9  Baby Feeding: Breast Disposition:home with mother   05/23/2016 Allie Bossier, MD

## 2016-05-23 NOTE — Discharge Instructions (Signed)

## 2016-05-24 ENCOUNTER — Telehealth: Payer: Self-pay | Admitting: Obstetrics and Gynecology

## 2016-05-24 NOTE — Telephone Encounter (Signed)
Telephone call from Call a nurse  Who talked to pt re: llq pain, nnoted after returning from grocery shopping this pm.no increased bleeding, no nausea or vomiting. Pain 10/10 per pt eval. Pt is now 30 hrs s/p SVD and 30 hr s/p tubal Pomeroy. Pt has percocet and ibuprofen Pt to take an additional percocet, expect some relief in 4-6 hr, if not consider MAU eval.

## 2016-05-26 ENCOUNTER — Encounter (HOSPITAL_COMMUNITY): Payer: Self-pay

## 2016-07-10 ENCOUNTER — Ambulatory Visit: Payer: Medicaid Other | Admitting: Obstetrics

## 2016-07-15 ENCOUNTER — Ambulatory Visit: Payer: Medicaid Other | Admitting: Obstetrics and Gynecology

## 2016-07-24 ENCOUNTER — Ambulatory Visit (INDEPENDENT_AMBULATORY_CARE_PROVIDER_SITE_OTHER): Payer: Medicaid Other | Admitting: Certified Nurse Midwife

## 2016-07-24 ENCOUNTER — Encounter: Payer: Self-pay | Admitting: Certified Nurse Midwife

## 2016-07-24 ENCOUNTER — Other Ambulatory Visit (HOSPITAL_COMMUNITY)
Admission: RE | Admit: 2016-07-24 | Discharge: 2016-07-24 | Disposition: A | Discharge status: Home / Self Care | Payer: Medicaid Other | Source: Ambulatory Visit | Attending: Certified Nurse Midwife | Admitting: Certified Nurse Midwife

## 2016-07-24 VITALS — BP 120/80 | HR 79 | Wt 158.6 lb

## 2016-07-24 DIAGNOSIS — Z9851 Tubal ligation status: Secondary | ICD-10-CM | POA: Insufficient documentation

## 2016-07-24 DIAGNOSIS — N898 Other specified noninflammatory disorders of vagina: Secondary | ICD-10-CM | POA: Diagnosis present

## 2016-07-24 DIAGNOSIS — Z720 Tobacco use: Secondary | ICD-10-CM | POA: Insufficient documentation

## 2016-07-24 DIAGNOSIS — Z0000 Encounter for general adult medical examination without abnormal findings: Secondary | ICD-10-CM | POA: Diagnosis not present

## 2016-07-24 DIAGNOSIS — Z01419 Encounter for gynecological examination (general) (routine) without abnormal findings: Secondary | ICD-10-CM | POA: Diagnosis not present

## 2016-07-24 NOTE — Progress Notes (Signed)
Patient presents for STD cultures, she is having a discharge. Denies itching not sure about odor because she sweats in her pubic area too.   LMP 07/24/16

## 2016-07-24 NOTE — Progress Notes (Signed)
Subjective:        Brooke Lucas is a 31 y.o. female here for a routine exam.  Current complaints: vaginal discharge, desires STD screening.  Hx of BTL with last delivery.  Smoker: 1/2 pack/day.  Hx of osteosarcoma in her brother, no other cancers in the family.      Personal health questionnaire:  Is patient Ashkenazi Jewish, have a family history of breast and/or ovarian cancer: no Is there a family history of uterine cancer diagnosed at age < 2450, gastrointestinal cancer, urinary tract cancer, family member who is a Personnel officerLynch syndrome-associated carrier: no Is the patient overweight and hypertensive, family history of diabetes, personal history of gestational diabetes, preeclampsia or PCOS: no Is patient over 5655, have PCOS,  family history of premature CHD under age 30, diabetes, smoke, have hypertension or peripheral artery disease:  yes At any time, has a partner hit, kicked or otherwise hurt or frightened you?: no Over the past 2 weeks, have you felt down, depressed or hopeless?: no Over the past 2 weeks, have you felt little interest or pleasure in doing things?:no   Gynecologic History Patient's last menstrual period was 07/24/2016 (exact date). Contraception: tubal ligation Last Pap: 2017. Results were: normal, no endocervical zone Last mammogram: n/a.   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 Anxiety   . Depression    after loss of son  . Sickle cell trait (HCC)   . Trichomonas     Past Surgical History:  Procedure  Laterality Date  . extraction wisdom    . TUBAL LIGATION N/A 05/22/2016   Procedure: POST PARTUM TUBAL LIGATION;  Surgeon: Hermina StaggersMichael L Ervin, MD;  [facility];  Service: Gynecology;  Laterality: N/A;     Current Outpatient Prescriptions:  .  ibuprofen (ADVIL,MOTRIN) 600 MG tablet, Take 1 tablet (600 mg total) by mouth every 6 (six) hours. (Patient not taking: Reported on 07/24/2016), Disp: 30 tablet, Rfl: 0 .  Prenat-FeCbn-FeAspGl-FA-Omega (OB COMPLETE PETITE) 35-5-1-200 MG CAPS, Take 1 capsule by mouth daily before breakfast. (Patient not taking: Reported on 07/24/2016), Disp: 90 capsule, Rfl: 3 .  ranitidine (ZANTAC) 150 MG tablet, Take 1 tablet (150 mg total) by mouth 2 (two) times daily. (Patient not taking: Reported on 07/24/2016), Disp: 60 tablet, Rfl: 5 Allergies  Allergen Reactions  . Amoxicillin Anaphylaxis    Has patient had a PCN reaction causing immediate rash, facial/tongue/throat swelling, SOB or lightheadedness with hypotension: Yes Has patient had a PCN reaction causing severe rash involving mucus membranes or skin necrosis: Yes Has patient had a PCN reaction that required hospitalization No Has patient had a PCN reaction occurring within the last 10 years: No If all of the above answers are "NO", then may proceed with Cephalosporin use.  Marland Kitchen. Penicillins Anaphylaxis    Has patient had a PCN  reaction causing immediate rash, facial/tongue/throat swelling, SOB or lightheadedness with hypotension: Yes Has patient had a PCN reaction causing severe rash involving mucus membranes or skin necrosis: Yes Has patient had a PCN reaction that required hospitalization No Has patient had a PCN reaction occurring within the last 10 years: No If all of the above answers are "NO", then may proceed with Cephalosporin use.  . Benadryl [Diphenhydramine] Anxiety    Social History  Substance Use Topics  . Smoking status: Current Every Day Smoker    Packs/day: 0.25    Years: 0.00     Types: Cigarettes  . Smokeless tobacco: Never Used     Comment: 1/2 pack per day  . Alcohol use No     Comment: Socially    Family History  Problem Relation Age of Onset  . Cancer Brother   . Birth defects Son        heart  . Heart disease Maternal Grandmother   . Arthritis Paternal Grandmother   . Other Neg Hx       Review of Systems  Constitutional: negative for fatigue and weight loss Respiratory: negative for cough and wheezing Cardiovascular: negative for chest pain, fatigue and palpitations Gastrointestinal: negative for abdominal pain and change in bowel habits Musculoskeletal:negative for myalgias Neurological: negative for gait problems and tremors Behavioral/Psych: negative for abusive relationship, depression Endocrine: negative for temperature intolerance    Genitourinary:negative for abnormal menstrual periods, genital lesions, hot flashes, sexual problems and vaginal discharge Integument/breast: negative for breast lump, breast tenderness, nipple discharge and skin lesion(s)    Objective:       BP 120/80   Pulse 79   Wt 158 lb 9.6 oz (71.9 kg)   LMP 07/24/2016 (Exact Date)   Breastfeeding? No   BMI 24.84 kg/m  General:   alert  Skin:   no rash or abnormalities  Lungs:   clear to auscultation bilaterally  Heart:   regular rate and rhythm, S1, S2 normal, no murmur, click, rub or gallop  Breasts:   deferred  Abdomen:  normal findings: no organomegaly, soft, non-tender and no hernia  Pelvis:  External genitalia: normal general appearance Urinary system: urethral meatus normal and bladder without fullness, nontender Vaginal: normal without tenderness, induration or masses Cervix: normal appearance Adnexa: normal bimanual exam Uterus: anteverted and non-tender, normal size   Lab Review Urine pregnancy test Labs reviewed yes Radiologic studies reviewed no  50% of 30 min visit spent on counseling and coordination of care.    Assessment:    Healthy  female exam.   s/p tubal ligation  Vaginal discharge  Tobacco abuse counseling  Plan:    Education reviewed: calcium supplements, depression evaluation, low fat, low cholesterol diet, safe sex/STD prevention, self breast exams, skin cancer screening and smoking cessation. Contraception: tubal ligation. Follow up in: 1 year.

## 2016-07-25 LAB — CERVICOVAGINAL ANCILLARY ONLY
BACTERIAL VAGINITIS: POSITIVE — AB
Candida vaginitis: NEGATIVE
Chlamydia: NEGATIVE
NEISSERIA GONORRHEA: NEGATIVE
TRICH (WINDOWPATH): NEGATIVE

## 2016-07-28 ENCOUNTER — Other Ambulatory Visit: Payer: Self-pay | Admitting: Certified Nurse Midwife

## 2016-07-28 ENCOUNTER — Telehealth: Payer: Self-pay | Admitting: *Deleted

## 2016-07-28 DIAGNOSIS — B9689 Other specified bacterial agents as the cause of diseases classified elsewhere: Secondary | ICD-10-CM

## 2016-07-28 DIAGNOSIS — N760 Acute vaginitis: Principal | ICD-10-CM

## 2016-07-28 MED ORDER — METRONIDAZOLE 500 MG PO TABS: 500.0000 mg | ORAL_TABLET | Freq: Two times a day (BID) | ORAL | 0 refills | Status: DC

## 2016-07-28 NOTE — Telephone Encounter (Signed)
Pt called to office for results Pt made aware of recent results and provider sent Rx.

## 2016-07-30 ENCOUNTER — Other Ambulatory Visit: Payer: Self-pay | Admitting: Certified Nurse Midwife

## 2016-07-30 LAB — CYTOLOGY - PAP: Diagnosis: NEGATIVE

## 2016-09-10 ENCOUNTER — Telehealth: Payer: Self-pay

## 2016-09-10 DIAGNOSIS — B9689 Other specified bacterial agents as the cause of diseases classified elsewhere: Secondary | ICD-10-CM

## 2016-09-10 DIAGNOSIS — B379 Candidiasis, unspecified: Secondary | ICD-10-CM

## 2016-09-10 DIAGNOSIS — N760 Acute vaginitis: Secondary | ICD-10-CM

## 2016-09-10 MED ORDER — FLUCONAZOLE 150 MG PO TABS: 150.0000 mg | ORAL_TABLET | Freq: Once | ORAL | 3 refills | Status: DC

## 2016-09-10 MED ORDER — METRONIDAZOLE 500 MG PO TABS: 500.0000 mg | ORAL_TABLET | Freq: Two times a day (BID) | ORAL | 0 refills | Status: DC

## 2016-09-10 NOTE — Telephone Encounter (Signed)
Pt complains of continuing to have vaginal discharge with odor. States that she finished the last antibiotic, but symptoms did not completely clear up. She also complains of having vaginal itching. She finished the metronidazole about 2 weeks ago. She states that she has not had intercourse since last visit. Rx sent for bv and yeast

## 2017-01-22 ENCOUNTER — Telehealth: Payer: Self-pay

## 2017-01-22 NOTE — Telephone Encounter (Signed)
Pt states after cycles, she tends to develop BV and yeast infections once abx is complete. Informed pt I will consult with provider and c/b.

## 2017-01-26 ENCOUNTER — Other Ambulatory Visit: Payer: Self-pay | Admitting: Certified Nurse Midwife

## 2017-01-26 DIAGNOSIS — B3731 Acute candidiasis of vulva and vagina: Secondary | ICD-10-CM

## 2017-01-26 DIAGNOSIS — N760 Acute vaginitis: Secondary | ICD-10-CM

## 2017-01-26 DIAGNOSIS — B9689 Other specified bacterial agents as the cause of diseases classified elsewhere: Secondary | ICD-10-CM

## 2017-01-26 DIAGNOSIS — B373 Candidiasis of vulva and vagina: Secondary | ICD-10-CM

## 2017-01-26 MED ORDER — METRONIDAZOLE 0.75 % VA GEL: 1.0000 | VAGINAL | 4 refills | Status: DC

## 2017-01-26 MED ORDER — TERCONAZOLE 0.8 % VA CREA: 1.0000 | TOPICAL_CREAM | Freq: Every day | VAGINAL | 4 refills | Status: DC

## 2017-01-26 MED ORDER — FLUCONAZOLE 200 MG PO TABS: 200.0000 mg | ORAL_TABLET | Freq: Once | ORAL | 2 refills | Status: DC

## 2017-01-27 NOTE — Telephone Encounter (Signed)
Pt picked up prescriptions today.

## 2017-03-30 ENCOUNTER — Ambulatory Visit: Payer: Self-pay | Admitting: Obstetrics

## 2017-05-05 ENCOUNTER — Ambulatory Visit: Payer: Medicaid Other | Admitting: Obstetrics and Gynecology

## 2017-05-05 ENCOUNTER — Other Ambulatory Visit (HOSPITAL_COMMUNITY)
Admission: RE | Admit: 2017-05-05 | Discharge: 2017-05-05 | Disposition: A | Discharge status: Home / Self Care | Payer: Medicaid Other | Source: Ambulatory Visit | Attending: Obstetrics and Gynecology | Admitting: Obstetrics and Gynecology

## 2017-05-05 ENCOUNTER — Encounter: Payer: Self-pay | Admitting: Obstetrics and Gynecology

## 2017-05-05 VITALS — BP 132/81 | HR 81 | Wt 150.0 lb

## 2017-05-05 DIAGNOSIS — N898 Other specified noninflammatory disorders of vagina: Secondary | ICD-10-CM | POA: Diagnosis not present

## 2017-05-05 DIAGNOSIS — N920 Excessive and frequent menstruation with regular cycle: Secondary | ICD-10-CM

## 2017-05-05 DIAGNOSIS — Z113 Encounter for screening for infections with a predominantly sexual mode of transmission: Secondary | ICD-10-CM

## 2017-05-05 DIAGNOSIS — B9689 Other specified bacterial agents as the cause of diseases classified elsewhere: Secondary | ICD-10-CM | POA: Insufficient documentation

## 2017-05-05 MED ORDER — MEDROXYPROGESTERONE ACETATE 150 MG/ML IM SUSP: 150.0000 mg | INTRAMUSCULAR | 0 refills | Status: DC

## 2017-05-05 NOTE — Progress Notes (Signed)
GYNECOLOGY OFFICE VISIT NOTE  History:  32 y.o. [id number] here today for vaginal discharge for about a week. Thicker than usual. Also reports that she usually has a yeast infection after her menses, she took diflucan x3 with no resolution. Also reports heavier periods than in the past, she is now using a tampon in addition to pads and is bleeding through them. Changing every 1.5-2 hrs. Lasts during the first three days which is heavier, last four days is lighter.    Past Medical History:  Diagnosis Date  . Anxiety   . Depression    after loss of son  . Sickle cell trait (HCC)   . Trichomonas     Past Surgical History:  Procedure Laterality Date  . extraction wisdom    . TUBAL LIGATION N/A 05/22/2016   Procedure: POST PARTUM TUBAL LIGATION;  Surgeon: Hermina Staggers, MD;  Location: [facility];  Service: Gynecology;  Laterality: N/A;    Current Outpatient Medications:  .  ibuprofen (ADVIL,MOTRIN) 600 MG tablet, Take 1 tablet (600 mg total) by mouth every 6 (six) hours., Disp: 30 tablet, Rfl: 0 .  ranitidine (ZANTAC) 150 MG tablet, Take 1 tablet (150 mg total) by mouth 2 (two) times daily., Disp: 60 tablet, Rfl: 5  The following portions of the patient's history were reviewed and updated as appropriate: allergies, current medications, past family history, past medical history, past social history, past surgical history and problem list.    Review of Systems:  Pertinent items noted in HPI and remainder of comprehensive ROS otherwise negative.   Objective:  Physical Exam BP 132/81   Pulse 81   Wt 150 lb (68 kg)   LMP 04/18/2017   BMI 23.49 kg/m  CONSTITUTIONAL: Well-developed, well-nourished female in no acute distress.  HENT:  Normocephalic, atraumatic. External right and left ear normal. Oropharynx is clear and moist EYES: Conjunctivae and EOM are normal. Pupils are equal, round, and reactive to light. No scleral icterus.  NECK: Normal range of motion, supple, no  masses SKIN: Skin is warm and dry. No rash noted. Not diaphoretic. No erythema. No pallor. NEUROLOGIC: Alert and oriented to person, place, and time. Normal reflexes, muscle tone coordination. No cranial nerve deficit noted. PSYCHIATRIC: Normal mood and affect. Normal behavior. Normal judgment and thought content. CARDIOVASCULAR: Normal heart rate noted RESPIRATORY: Effort and breath sounds normal, no problems with respiration noted ABDOMEN: Soft, no distention noted.   PELVIC: normal appearing external female genitalia, normal appearing cervix, moderate amount thick white discharge MUSCULOSKELETAL: Normal range of motion. No edema noted.  Labs and Imaging No results found.  Assessment & Plan:   1. Vaginal discharge - Cervicovaginal ancillary only  2. Routine screening for STI (sexually transmitted infection) - HIV antibody (with reflex) - RPR - Hepatitis B surface antigen - Hepatitis C Antibody  3. Menorrhagia with regular cycle Heavy periods since delivery/BTL 1 year ago to the point that she is changing pads ever 1-2 hours and bleeding through her pads Requesting something for menorrhagia Reviewed CHC and risks associated as she smokes, she declines CHC Reviewed mirena versus depo, she would like to try depo for now as she has been on this before and it decreased her periods Reviewed low risk of pregnancy with intercourse since last menses in setting of BTL, but that it is not zero and reviewed risks of depo in early pregnancy UPT negative today   Routine preventative health maintenance measures emphasized. Please refer to After Visit Summary for other  counseling recommendations.   Return in about 3 months (around 08/05/2017).   Baldemar LenisK. Meryl Davis, M.D. Attending Obstetrician & Gynecologist, [facility], [facility]

## 2017-05-05 NOTE — Progress Notes (Signed)
Pt here for vaginal discharge. She states that the discharge is thick and white with some vaginal itching. She states that she has a little bit of an odor.

## 2017-05-06 ENCOUNTER — Ambulatory Visit: Payer: Medicaid Other

## 2017-05-06 ENCOUNTER — Telehealth: Payer: Self-pay

## 2017-05-06 MED ORDER — MEDROXYPROGESTERONE ACETATE 150 MG/ML IM SUSP: 150.0000 mg | INTRAMUSCULAR | 1 refills | Status: DC

## 2017-05-06 NOTE — Telephone Encounter (Signed)
Returned call and assisted pt with getting depo from pharmacy, and pt stated that she would like her appt moved to tomorrow.

## 2017-05-07 ENCOUNTER — Ambulatory Visit: Payer: Medicaid Other

## 2017-05-07 LAB — HEPATITIS B SURFACE ANTIGEN: Hepatitis B Surface Ag: NEGATIVE

## 2017-05-07 LAB — HIV ANTIBODY (ROUTINE TESTING W REFLEX): HIV Screen 4th Generation wRfx: NONREACTIVE

## 2017-05-07 LAB — CERVICOVAGINAL ANCILLARY ONLY
BACTERIAL VAGINITIS: POSITIVE — AB
CANDIDA VAGINITIS: NEGATIVE
Chlamydia: NEGATIVE
NEISSERIA GONORRHEA: NEGATIVE
TRICH (WINDOWPATH): NEGATIVE

## 2017-05-07 LAB — HEPATITIS C ANTIBODY: Hep C Virus Ab: <0.1 s/co ratio (ref 0.0–0.9)

## 2017-05-07 LAB — RPR: RPR Ser Ql: NONREACTIVE

## 2017-05-08 ENCOUNTER — Other Ambulatory Visit: Payer: Self-pay

## 2017-05-08 NOTE — Progress Notes (Signed)
Returned call and pt stated that the pharmacy fixed the problem with her depo.

## 2017-05-09 MED ORDER — METRONIDAZOLE 500 MG PO TABS: 500.0000 mg | ORAL_TABLET | Freq: Two times a day (BID) | ORAL | 0 refills | Status: DC

## 2017-05-09 NOTE — Addendum Note (Signed)
Addended by: Leroy LibmanAVIS, KELLY on: 05/09/2017 12:40 PM   Modules accepted: Orders

## 2017-05-11 ENCOUNTER — Other Ambulatory Visit: Payer: Self-pay

## 2017-05-11 DIAGNOSIS — B379 Candidiasis, unspecified: Secondary | ICD-10-CM

## 2017-05-11 MED ORDER — FLUCONAZOLE 150 MG PO TABS: 150.0000 mg | ORAL_TABLET | Freq: Once | ORAL | 0 refills | Status: DC

## 2017-05-13 ENCOUNTER — Ambulatory Visit (INDEPENDENT_AMBULATORY_CARE_PROVIDER_SITE_OTHER): Payer: Medicaid Other

## 2017-05-13 DIAGNOSIS — Z3042 Encounter for surveillance of injectable contraceptive: Secondary | ICD-10-CM | POA: Diagnosis not present

## 2017-05-13 MED ORDER — MEDROXYPROGESTERONE ACETATE 150 MG/ML IM SUSP
150.0000 mg | Freq: Once | INTRAMUSCULAR | Status: AC
  Administered 2017-05-13: 150 mg via INTRAMUSCULAR

## 2017-05-13 NOTE — Progress Notes (Signed)
Per pt pt presents for depo due to heavy periods  Next Depo Due Jun 19-Jul 3rd Pt made aware. Given w/o any problems. Left Deltoid Pt supplied.

## 2017-05-19 ENCOUNTER — Ambulatory Visit: Payer: Medicaid Other

## 2017-07-30 ENCOUNTER — Ambulatory Visit (INDEPENDENT_AMBULATORY_CARE_PROVIDER_SITE_OTHER): Payer: Medicaid Other

## 2017-07-30 DIAGNOSIS — Z3042 Encounter for surveillance of injectable contraceptive: Secondary | ICD-10-CM | POA: Diagnosis not present

## 2017-07-30 MED ORDER — MEDROXYPROGESTERONE ACETATE 150 MG/ML IM SUSP
150.0000 mg | Freq: Once | INTRAMUSCULAR | Status: AC
  Administered 2017-07-30: 150 mg via INTRAMUSCULAR

## 2017-07-30 NOTE — Progress Notes (Signed)
Nurse visit for office supplied Depo given L del w/o difficulty. Pt is on time for injection. Next Depo due Sept 5-19.

## 2017-10-01 ENCOUNTER — Other Ambulatory Visit: Payer: Self-pay

## 2017-10-01 DIAGNOSIS — B373 Candidiasis of vulva and vagina: Secondary | ICD-10-CM

## 2017-10-01 DIAGNOSIS — B3731 Acute candidiasis of vulva and vagina: Secondary | ICD-10-CM

## 2017-10-01 DIAGNOSIS — N898 Other specified noninflammatory disorders of vagina: Secondary | ICD-10-CM

## 2017-10-01 MED ORDER — METRONIDAZOLE 500 MG PO TABS: 500.0000 mg | ORAL_TABLET | Freq: Two times a day (BID) | ORAL | 0 refills | Status: DC

## 2017-10-01 MED ORDER — FLUCONAZOLE 150 MG PO TABS
150.0000 mg | ORAL_TABLET | Freq: Once | ORAL | 0 refills | Status: DC
Start: 2017-10-01 — End: 2017-10-01

## 2017-10-01 NOTE — Progress Notes (Signed)
Pt called stating since she had her BTL her periods are heavier and after every period she gets symptoms of a bacterial infection. Pt is requesting rx for flagyl be sent to pharmacy. I advised pt I would send the rx flagyl and 1 diflucan because she states she gets a yeast infection after the antibiotic. Pt verbalized understanding.

## 2017-10-14 ENCOUNTER — Ambulatory Visit: Payer: Medicaid Other | Admitting: Obstetrics

## 2017-10-20 ENCOUNTER — Ambulatory Visit: Payer: Medicaid Other

## 2017-11-22 ENCOUNTER — Ambulatory Visit (HOSPITAL_COMMUNITY)
Admission: EM | Admit: 2017-11-22 | Discharge: 2017-11-22 | Disposition: A | Discharge status: Home / Self Care | Payer: Medicaid Other | Attending: Family Medicine | Admitting: Family Medicine

## 2017-11-22 ENCOUNTER — Encounter (HOSPITAL_COMMUNITY): Payer: Self-pay

## 2017-11-22 DIAGNOSIS — L02411 Cutaneous abscess of right axilla: Secondary | ICD-10-CM | POA: Diagnosis not present

## 2017-11-22 MED ORDER — MUPIROCIN 2 % EX OINT: 1.0000 "application " | TOPICAL_OINTMENT | Freq: Two times a day (BID) | CUTANEOUS | 0 refills | Status: DC

## 2017-11-22 MED ORDER — DOXYCYCLINE HYCLATE 100 MG PO CAPS: 100.0000 mg | ORAL_CAPSULE | Freq: Two times a day (BID) | ORAL | 0 refills | Status: DC

## 2017-11-22 NOTE — Discharge Instructions (Signed)
Start Doxycycline as directed. You can remove current dressing in 24 hours. Keep wound clean and dry. You can clean gently with soap and water. You can use bactroban to dress to wound. Do not soak area in water. Continue warm compress. Monitor for spreading redness, increased warmth, increased swelling, fever, follow up for reevaluation needed.

## 2017-11-22 NOTE — ED Provider Notes (Signed)
[facility]    CSN: [id number] Arrival date & time: 11/22/17  1155     History   Chief Complaint Chief Complaint  Patient presents with  . Abscess    HPI Brooke Lucas is a 32 y.o. female.   32 year old female comes in for 1 week history of abscess to the right axilla.  States started out smaller, but gradually increased in size despite warm compress.  Denies fever, chills, night sweats.  Denies spreading erythema, increased warmth.  Last shaved a few weeks ago.  History of abscess to the groin area.      Past Medical History:  Diagnosis Date  . Anxiety   . Depression    after loss of son  . Sickle cell trait (HCC)   . Trichomonas     Patient Active Problem List   Diagnosis Date Noted  . History of bilateral tubal ligation 07/24/2016  . Tobacco abuse 07/24/2016  . Unwanted fertility 04/24/2016    Past Surgical History:  Procedure Laterality Date  . extraction wisdom    . TUBAL LIGATION N/A 05/22/2016   Procedure: POST PARTUM TUBAL LIGATION;  Surgeon: Hermina Staggers, MD;  Location: [facility]ING SU[facility];  Service: Gynecology;  Laterality: N/A;    OB History    Gravida  3   Para  3   Term  3   Preterm  0   AB  0   Living  2     SAB  0   TAB  0   Ectopic  0   Multiple  0   Live Births  2            Home Medications    Prior to Admission medications   Medication Sig Start Date End Date Taking? Authorizing Provider  doxycycline (VIBRAMYCIN) 100 MG capsule Take 1 capsule (100 mg total) by mouth 2 (two) times daily. 11/22/17   Cathie Hoops, Elmina Hendel V, PA-C  ibuprofen (ADVIL,MOTRIN) 600 MG tablet Take 1 tablet (600 mg total) by mouth every 6 (six) hours. 05/23/16   Allie Bossier, MD  medroxyPROGESTERone (DEPO-PROVERA) 150 MG/ML injection Inject 1 mL (150 mg total) into the muscle every 3 (three) months. 05/06/17   Brock Bad, MD  metroNIDAZOLE (FLAGYL) 500 MG tablet Take 1 tablet (500 mg total) by mouth 2 (two) times daily. 10/01/17    Brock Bad, MD  mupirocin ointment (BACTROBAN) 2 % Apply 1 application topically 2 (two) times daily. 11/22/17   Cathie Hoops, Akaysha Cobern V, PA-C  ranitidine (ZANTAC) 150 MG tablet Take 1 tablet (150 mg total) by mouth 2 (two) times daily. 03/14/16   Brock Bad, MD    Family History Family History  Problem Relation Age of Onset  . Cancer Brother   . Birth defects Son        heart  . Heart disease Maternal Grandmother   . Arthritis Paternal Grandmother   . Other Neg Hx     Social History Social History   Tobacco Use  . Smoking status: Current Every Day Smoker    Packs/day: 0.25    Years: 0.00    Pack years: 0.00    Types: Cigarettes  . Smokeless tobacco: Never Used  . Tobacco comment: 1/2 pack per day  Substance Use Topics  . Alcohol use: No    Alcohol/week: 0.0 standard drinks    Comment: Socially  . Drug use: Yes    Types: Marijuana    Comment: PT DENIES BUT +UDS 05/22/16  Allergies   Amoxicillin; Penicillins; and Benadryl [diphenhydramine]   Review of Systems Review of Systems  Reason unable to perform ROS: See HPI as above.     Physical Exam Triage Vital Signs ED Triage Vitals  Enc Vitals Group     BP 11/22/17 1211 112/61     Pulse Rate 11/22/17 1211 99     Resp 11/22/17 1211 20     Temp 11/22/17 1211 98.3 F (36.8 C)     Temp Source 11/22/17 1211 Oral     SpO2 11/22/17 1211 99 %     Weight --      Height --      Head Circumference --      Peak Flow --      Pain Score 11/22/17 1212 8     Pain Loc --      Pain Edu? --      Excl. in GC? --    No data found.  Updated Vital Signs BP 112/61 (BP Location: Left Arm)   Pulse 99   Temp 98.3 F (36.8 C) (Oral)   Resp 20   SpO2 99%   Visual Acuity Right Eye Distance:   Left Eye Distance:   Bilateral Distance:    Right Eye Near:   Left Eye Near:    Bilateral Near:     Physical Exam  Constitutional: She is oriented to person, place, and time. She appears well-developed and well-nourished. No  distress.  HENT:  Head: Normocephalic and atraumatic.  Eyes: Pupils are equal, round, and reactive to light. Conjunctivae are normal.  Neurological: She is alert and oriented to person, place, and time.  Skin: Skin is warm and dry. She is not diaphoretic.  2 cm x 2 cm abscess to the right axilla.  Mild surrounding erythema without increased warmth.  Fluctuance felt with induration.     UC Treatments / Results  Labs (all labs ordered are listed, but only abnormal results are displayed) Labs Reviewed - No data to display  EKG None  Radiology No results found.  Procedures Incision and Drainage Date/Time: 11/22/2017 12:46 PM Performed by: Belinda Fisher, PA-C Authorized by: Elvina Sidle, MD   Consent:    Consent obtained:  Verbal   Consent given by:  Patient   Risks discussed:  Bleeding, incomplete drainage, infection and pain   Alternatives discussed:  Alternative treatment and referral Location:    Type:  Abscess   Size:  2cm x 2cm   Location:  Upper extremity   Upper extremity location: right axilla. Pre-procedure details:    Skin preparation:  Hibiclens Anesthesia (see MAR for exact dosages):    Anesthesia method:  Local infiltration   Local anesthetic:  Lidocaine 2% WITH epi Procedure type:    Complexity:  Simple Procedure details:    Needle aspiration: no     Incision types:  Single straight   Incision depth:  Dermal   Scalpel blade:  11   Wound management:  Probed and deloculated and irrigated with saline   Drainage:  Purulent   Drainage amount:  Moderate   Wound treatment:  Wound left open   Packing materials:  None Post-procedure details:    Patient tolerance of procedure:  Tolerated well, no immediate complications   (including critical care time)  Medications Ordered in UC Medications - No data to display  Initial Impression / Assessment and Plan / UC Course  I have reviewed the triage vital signs and the nursing notes.  Pertinent labs &  imaging  results that were available during my care of the patient were reviewed by me and considered in my medical decision making (see chart for details).    Patient tolerated procedure well. Start doxycycline. Wound care instructions given. Return precautions given. Patient expresses understanding and agrees to plan.   Final Clinical Impressions(s) / UC Diagnoses   Final diagnoses:  Abscess of axilla, right    ED Prescriptions    Medication Sig Dispense Auth. Provider   doxycycline (VIBRAMYCIN) 100 MG capsule Take 1 capsule (100 mg total) by mouth 2 (two) times daily. 20 capsule Cathie Hoops, Fitzpatrick Alberico V, PA-C   mupirocin ointment (BACTROBAN) 2 % Apply 1 application topically 2 (two) times daily. 22 g Threasa Alpha, [place] 11/22/17 1247

## 2017-11-22 NOTE — ED Triage Notes (Signed)
Pt presents with abscess under right arm. 

## 2017-12-10 ENCOUNTER — Ambulatory Visit: Payer: Medicaid Other

## 2017-12-10 ENCOUNTER — Ambulatory Visit (INDEPENDENT_AMBULATORY_CARE_PROVIDER_SITE_OTHER): Payer: Medicaid Other | Admitting: Obstetrics

## 2017-12-10 DIAGNOSIS — Z3042 Encounter for surveillance of injectable contraceptive: Secondary | ICD-10-CM

## 2017-12-10 DIAGNOSIS — Z3202 Encounter for pregnancy test, result negative: Secondary | ICD-10-CM | POA: Diagnosis not present

## 2017-12-10 LAB — POCT URINE PREGNANCY: PREG TEST UR: NEGATIVE

## 2017-12-10 MED ORDER — MEDROXYPROGESTERONE ACETATE 150 MG/ML IM SUSP
150.0000 mg | INTRAMUSCULAR | Status: DC
  Administered 2017-12-10: 150 mg via INTRAMUSCULAR

## 2017-12-10 NOTE — Progress Notes (Signed)
Patient is in the office for depo restart, UPT neg, administered depo and pt tolerated well. Pt will return for annual in November. Next depo due Jan16- Jan30. .. Administrations This Visit    medroxyPROGESTERone (DEPO-PROVERA) injection 150 mg    Admin Date 12/10/2017 Action Given Dose 150 mg Route Intramuscular Administered By Katrina Stack, RN

## 2017-12-11 NOTE — Progress Notes (Signed)
I have reviewed this chart and agree with the RN/CMA assessment and management.    K. Meryl Melony Tenpas, M.D. [facility]  

## 2017-12-11 NOTE — Progress Notes (Signed)
Depo Injection given

## 2017-12-22 ENCOUNTER — Other Ambulatory Visit: Payer: Self-pay

## 2017-12-22 ENCOUNTER — Emergency Department (HOSPITAL_COMMUNITY)
Admission: EM | Admit: 2017-12-22 | Discharge: 2017-12-22 | Disposition: A | Discharge status: Home / Self Care | Payer: Medicaid Other | Attending: Emergency Medicine | Admitting: Emergency Medicine

## 2017-12-22 ENCOUNTER — Encounter (HOSPITAL_COMMUNITY): Payer: Self-pay | Admitting: Emergency Medicine

## 2017-12-22 DIAGNOSIS — J069 Acute upper respiratory infection, unspecified: Secondary | ICD-10-CM | POA: Diagnosis not present

## 2017-12-22 DIAGNOSIS — F17210 Nicotine dependence, cigarettes, uncomplicated: Secondary | ICD-10-CM | POA: Insufficient documentation

## 2017-12-22 DIAGNOSIS — J029 Acute pharyngitis, unspecified: Secondary | ICD-10-CM | POA: Diagnosis present

## 2017-12-22 NOTE — ED Triage Notes (Addendum)
Patient c/o sore throat, body aches, cough and headache since yesterday. [facility]. Afebrile in triage.

## 2017-12-22 NOTE — ED Provider Notes (Signed)
[facility]-EMERGENCY DEPT Provider Note   CSN: [id number] Arrival date & time: 12/22/17  1224     History   Chief Complaint Chief Complaint  Patient presents with  . Generalized Body Aches  . Sore Throat    HPI Brooke Lucas is a 32 y.o. female.  32 y.o female with a PMH of sickle cell trait presents to the ED with a chief complaint of sore throat and generalized weakness x 3 days.  She reports her son has been sick with a viral URI and she is now starting to experience the same symptoms.  She was seen at [facility] yesterday where she was told this was a viral URI which would pass in the next couple days.  Patient reports she wanted her son herself rechecked.  She has been taking Tylenol for her fevers and reports a T-max of 100 even.  She denies any chest pain, shortness of breath, aminal pain.     Past Medical History:  Diagnosis Date  . Anxiety   . Depression    after loss of son  . Sickle cell trait (HCC)   . Trichomonas     Patient Active Problem List   Diagnosis Date Noted  . History of bilateral tubal ligation 07/24/2016  . Tobacco abuse 07/24/2016  . Unwanted fertility 04/24/2016    Past Surgical History:  Procedure Laterality Date  . extraction wisdom    . TUBAL LIGATION N/A 05/22/2016   Procedure: POST PARTUM TUBAL LIGATION;  Surgeon: Hermina StaggersMichael L Ervin, MD;  [facility];  Service: Gynecology;  Laterality: N/A;     OB History    Gravida  3   Para  3   Term  3   Preterm  0   AB  0   Living  2     SAB  0   TAB  0   Ectopic  0   Multiple  0   Live Births  2            Home Medications    Prior to Admission medications   Medication Sig Start Date End Date Taking? Authorizing Provider  doxycycline (VIBRAMYCIN) 100 MG capsule Take 1 capsule (100 mg total) by mouth 2 (two) times daily. 11/22/17   Cathie HoopsYu, Amy V, PA-C  ibuprofen (ADVIL,MOTRIN) 600 MG tablet Take 1 tablet (600 mg total) by  mouth every 6 (six) hours. 05/23/16   Allie Bossierove, Myra C, MD  medroxyPROGESTERone (DEPO-PROVERA) 150 MG/ML injection Inject 1 mL (150 mg total) into the muscle every 3 (three) months. 05/06/17   Brock BadHarper, Charles A, MD  metroNIDAZOLE (FLAGYL) 500 MG tablet Take 1 tablet (500 mg total) by mouth 2 (two) times daily. 10/01/17   Brock BadHarper, Charles A, MD  mupirocin ointment (BACTROBAN) 2 % Apply 1 application topically 2 (two) times daily. 11/22/17   Cathie HoopsYu, Amy V, PA-C  ranitidine (ZANTAC) 150 MG tablet Take 1 tablet (150 mg total) by mouth 2 (two) times daily. 03/14/16   Brock BadHarper, Charles A, MD    Family History Family History  Problem Relation Age of Onset  . Cancer Brother   . Birth defects Son        heart  . Heart disease Maternal Grandmother   . Arthritis Paternal Grandmother   . Other Neg Hx     Social History Social History   Tobacco Use  . Smoking status: Current Every Day Smoker    Packs/day: 0.25    Years: 0.00  Pack years: 0.00    Types: Cigarettes  . Smokeless tobacco: Never Used  . Tobacco comment: 1/2 pack per day  Substance Use Topics  . Alcohol use: No    Alcohol/week: 0.0 standard drinks    Comment: Socially  . Drug use: Yes    Types: Marijuana    Comment: PT DENIES BUT +UDS 05/22/16     Allergies   Amoxicillin; Penicillins; and Benadryl [diphenhydramine]   Review of Systems Review of Systems  Constitutional: Positive for fever.  HENT: Positive for sore throat.   Respiratory: Negative for cough and shortness of breath.      Physical Exam Updated Vital Signs BP 121/73 (BP Location: Right Arm)   Pulse 82   Temp 98 F (36.7 C)   Resp 18   LMP 12/15/2017 (Approximate)   SpO2 100%   Physical Exam  Constitutional: She is oriented to person, place, and time. She appears well-developed and well-nourished. No distress.  HENT:  Head: Normocephalic and atraumatic.  Right Ear: Tympanic membrane normal. Tympanic membrane is not erythematous.  Left Ear: Tympanic membrane  normal. Tympanic membrane is not erythematous.  Mouth/Throat: Uvula is midline and oropharynx is clear and moist. No oropharyngeal exudate. No tonsillar exudate.  No exudate, erythema, edema noted to the oropharyngeal area.  Sinus pressure along the maxillary or frontal region.  No rhinorrhea noted on exam.  Eyes: Pupils are equal, round, and reactive to light.  Neck: Normal range of motion.  Cardiovascular: Regular rhythm and normal heart sounds.  Pulmonary/Chest: Effort normal and breath sounds normal. No respiratory distress.  Abdominal: Soft. Bowel sounds are normal. She exhibits no distension. There is no tenderness.  Musculoskeletal: She exhibits no tenderness or deformity.       Right lower leg: She exhibits no edema.       Left lower leg: She exhibits no edema.  Neurological: She is alert and oriented to person, place, and time.  Skin: Skin is warm and dry.  Psychiatric: She has a normal mood and affect.  Nursing note and vitals reviewed.    ED Treatments / Results  Labs (all labs ordered are listed, but only abnormal results are displayed) Labs Reviewed - No data to display  EKG None  Radiology No results found.  Procedures Procedures (including critical care time)  Medications Ordered in ED Medications - No data to display   Initial Impression / Assessment and Plan / ED Course  I have reviewed the triage vital signs and the nursing notes.  Pertinent labs & imaging results that were available during my care of the patient were reviewed by me and considered in my medical decision making (see chart for details).     She presents with viral URI x3 days, she is currently in the ED with her 29-month-old son who has the same symptoms.  Been taking Tylenol for the fever but states no relieving symptoms.  During ED visit patient is afebrile and her exam is benign at this time.  Patient is a smoker and has decreased lung sounds sounds and appears well overall.  Vice patient  that she is likely dealing with a virus and will need to treat her symptoms as instructed.  Return precautions provided for patient.  Final Clinical Impressions(s) / ED Diagnoses   Final diagnoses:  Viral upper respiratory tract infection    ED Discharge Orders    None       Claude Manges, PA-C 12/22/17 1440    Rolan Bucco, MD 12/22/17  1556  

## 2017-12-22 NOTE — Discharge Instructions (Signed)
You may continue to take tylenol for your fever. I have provided a work note to go back on Thursday, please return if symptoms worsen.

## 2017-12-30 ENCOUNTER — Ambulatory Visit: Payer: Medicaid Other | Admitting: Obstetrics

## 2018-01-13 ENCOUNTER — Encounter (HOSPITAL_COMMUNITY): Payer: Self-pay

## 2018-01-13 ENCOUNTER — Emergency Department (HOSPITAL_COMMUNITY): Payer: Medicaid Other

## 2018-01-13 ENCOUNTER — Emergency Department (HOSPITAL_COMMUNITY)
Admission: EM | Admit: 2018-01-13 | Discharge: 2018-01-14 | Disposition: A | Discharge status: Home / Self Care | Payer: Medicaid Other | Attending: Emergency Medicine | Admitting: Emergency Medicine

## 2018-01-13 DIAGNOSIS — D573 Sickle-cell trait: Secondary | ICD-10-CM | POA: Diagnosis not present

## 2018-01-13 DIAGNOSIS — R51 Headache: Secondary | ICD-10-CM | POA: Insufficient documentation

## 2018-01-13 DIAGNOSIS — R112 Nausea with vomiting, unspecified: Secondary | ICD-10-CM | POA: Diagnosis not present

## 2018-01-13 DIAGNOSIS — F17210 Nicotine dependence, cigarettes, uncomplicated: Secondary | ICD-10-CM | POA: Diagnosis not present

## 2018-01-13 DIAGNOSIS — Z79899 Other long term (current) drug therapy: Secondary | ICD-10-CM | POA: Diagnosis not present

## 2018-01-13 DIAGNOSIS — R05 Cough: Secondary | ICD-10-CM | POA: Insufficient documentation

## 2018-01-13 DIAGNOSIS — R6883 Chills (without fever): Secondary | ICD-10-CM | POA: Insufficient documentation

## 2018-01-13 DIAGNOSIS — R519 Headache, unspecified: Secondary | ICD-10-CM

## 2018-01-13 NOTE — ED Triage Notes (Signed)
Pt comes via [facility] for headache, nausea, cold chills that have been going on for several hours, took tylenol PTA..Marland Kitchen

## 2018-01-13 NOTE — ED Notes (Signed)
Pt informed that she is going to the lobby, pt now complaining of SOB

## 2018-01-14 MED ORDER — METOCLOPRAMIDE HCL 5 MG/ML IJ SOLN
10.0000 mg | Freq: Once | INTRAMUSCULAR | Status: AC
  Administered 2018-01-14: 10 mg via INTRAVENOUS
  Filled 2018-01-14: qty 2

## 2018-01-14 MED ORDER — SODIUM CHLORIDE 0.9 % IV BOLUS
1000.0000 mL | Freq: Once | INTRAVENOUS | Status: AC
  Administered 2018-01-14: 1000 mL via INTRAVENOUS

## 2018-01-14 NOTE — ED Provider Notes (Signed)
[facility] EMERGENCY [facility]PART[facility] Provider Note   CSN: [id number] Arrival date & time: 01/13/18  2311     History   Chief Complaint Chief Complaint  Patient presents with  . Influenza  . Migraine    HPI Brooke Lucas is a 32 y.o. female.  The history is provided by the patient.  She has history of sickle cell trait and comes in because of headache, nausea, vomiting, chills.  She woke up this morning with a right-sided headache which is spread to me a global headache.  She describes a dull, throbbing pain which initially was 6/10 and has worsened to 7/10.  She tried taking acetaminophen without any improvement.  She has had a nonproductive cough and she has had nausea and vomiting.  She was worried that she might have pneumonia because she had a respiratory infection several weeks ago.  She denies fever, but has had chills.  She denies any generalized arthralgias or myalgias.  Past Medical History:  Diagnosis Date  . Anxiety   . Depression    after loss of son  . Sickle cell trait (HCC)   . Trichomonas     Patient Active Problem List   Diagnosis Date Noted  . History of bilateral tubal ligation 07/24/2016  . Tobacco abuse 07/24/2016  . Unwanted fertility 04/24/2016    Past Surgical History:  Procedure Laterality Date  . extraction wisdom    . TUBAL LIGATION N/A 05/22/2016   Procedure: POST PARTUM TUBAL LIGATION;  Surgeon: Hermina StaggersMichael L Ervin, MD;  [facility];  Service: Gynecology;  Laterality: N/A;     OB History    Gravida  3   Para  3   Term  3   Preterm  0   AB  0   Living  2     SAB  0   TAB  0   Ectopic  0   Multiple  0   Live Births  2            Home Medications    Prior to Admission medications   Medication Sig Start Date End Date Taking? Authorizing Provider  doxycycline (VIBRAMYCIN) 100 MG capsule Take 1 capsule (100 mg total) by mouth 2 (two) times daily. 11/22/17   Cathie HoopsYu, Amy V, PA-C  ibuprofen  (ADVIL,MOTRIN) 600 MG tablet Take 1 tablet (600 mg total) by mouth every 6 (six) hours. 05/23/16   Allie Bossierove, Myra C, MD  medroxyPROGESTERone (DEPO-PROVERA) 150 MG/ML injection Inject 1 mL (150 mg total) into the muscle every 3 (three) months. 05/06/17   Brock BadHarper, Charles A, MD  metroNIDAZOLE (FLAGYL) 500 MG tablet Take 1 tablet (500 mg total) by mouth 2 (two) times daily. 10/01/17   Brock BadHarper, Charles A, MD  mupirocin ointment (BACTROBAN) 2 % Apply 1 application topically 2 (two) times daily. 11/22/17   Cathie HoopsYu, Amy V, PA-C  ranitidine (ZANTAC) 150 MG tablet Take 1 tablet (150 mg total) by mouth 2 (two) times daily. 03/14/16   Brock BadHarper, Charles A, MD    Family History Family History  Problem Relation Age of Onset  . Cancer Brother   . Birth defects Son        heart  . Heart disease Maternal Grandmother   . Arthritis Paternal Grandmother   . Other Neg Hx     Social History Social History   Tobacco Use  . Smoking status: Current Every Day Smoker    Packs/day: 0.25    Years: 0.00  Pack years: 0.00    Types: Cigarettes  . Smokeless tobacco: Never Used  . Tobacco comment: 1/2 pack per day  Substance Use Topics  . Alcohol use: No    Alcohol/week: 0.0 standard drinks    Comment: Socially  . Drug use: Yes    Types: Marijuana    Comment: PT DENIES BUT +UDS 05/22/16     Allergies   Amoxicillin; Penicillins; and Benadryl [diphenhydramine]   Review of Systems Review of Systems  All other systems reviewed and are negative.    Physical Exam Updated Vital Signs BP 129/88 (BP Location: Right Arm)   Pulse 81   Temp 98.3 F (36.8 C) (Oral)   Resp 20   Ht 5\' 7"  (1.702 m)   Wt 72.6 kg   LMP 12/15/2017 (Approximate)   SpO2 100%   BMI 25.06 kg/m   Physical Exam  Nursing note and vitals reviewed.  32 year old female, resting comfortably and in no acute distress. Vital signs are normal. Oxygen saturation is 100%, which is normal. Head is normocephalic and atraumatic. PERRLA, EOMI. Oropharynx  is clear. Neck is nontender and supple without adenopathy or JVD. Back is nontender and there is no CVA tenderness. Lungs are clear without rales, wheezes, or rhonchi. Chest is nontender. Heart has regular rate and rhythm without murmur. Abdomen is soft, flat, nontender without masses or hepatosplenomegaly and peristalsis is hypoactive. Extremities have no cyanosis or edema, full range of motion is present. Skin is warm and dry without rash. Neurologic: Mental status is normal, cranial nerves are intact, there are no motor or sensory deficits.  ED Treatments / Results   EKG EKG Interpretation  Date/Time:  Thursday January 14 2018 00:20:28 EST Ventricular Rate:  92 PR Interval:  178 QRS Duration: 86 QT Interval:  368 QTC Calculation: 455 R Axis:   82 Text Interpretation:  Normal sinus rhythm Normal ECG When compared with ECG of 08/12/2014, No significant change was found Confirmed by Dione Booze ([id number]) on 01/14/2018 1:51:21 AM   Radiology Dg Chest 2 View  Result Date: 01/13/2018 CLINICAL DATA:  Shortness of breath, flu symptoms today. EXAM: CHEST - 2 VIEW COMPARISON:  Chest radiograph December 24, 2011 FINDINGS: Cardiomediastinal silhouette is normal. No pleural effusions or focal consolidations. Trachea projects midline and there is no pneumothorax. Soft tissue planes and included osseous structures are non-suspicious. IMPRESSION: Normal chest radiograph. Electronically Signed   By: Awilda Metro M.D.   On: 01/13/2018 23:50    Procedures Procedures   Medications Ordered in ED Medications  sodium chloride 0.9 % bolus 1,000 mL (1,000 mLs Intravenous New Bag/Given 01/14/18 0228)  metoCLOPramide (REGLAN) injection 10 mg (10 mg Intravenous Given 01/14/18 0227)     Initial Impression / Assessment and Plan / ED Course  I have reviewed the triage vital signs and the nursing notes.  Pertinent labs & imaging results that were available during my care of the patient were reviewed  by me and considered in my medical decision making (see chart for details).  Headache of uncertain cause.  May be part of a viral illness.  She was sent for chest x-ray from triage which showed no evidence of pneumonia.  She is afebrile in the ED.  She will be given headache cocktail of normal saline, metoclopramide (diphenhydramine not given because of sensitivity to it).  Old records are reviewed confirming ED visit on November 12 for viral upper respiratory infection.  She had excellent relief of headache with above-noted treatment.  She  is discharged.  Final Clinical Impressions(s) / ED Diagnoses   Final diagnoses:  Bad headache    ED Discharge Orders    None       Dione Booze, MD 01/14/18 [phone]

## 2018-01-14 NOTE — ED Notes (Signed)
  Patient stated her IV felt weird and asked me to remove it before she did.  Patient also stated she wanted to go home and would have a panic attack if she didn't leave soon.  Dr. Preston FleetingGlick notified

## 2018-01-23 ENCOUNTER — Other Ambulatory Visit: Payer: Self-pay | Admitting: Obstetrics

## 2018-01-23 DIAGNOSIS — N898 Other specified noninflammatory disorders of vagina: Secondary | ICD-10-CM

## 2018-02-18 ENCOUNTER — Ambulatory Visit: Payer: Medicaid Other | Admitting: Obstetrics

## 2018-03-03 ENCOUNTER — Ambulatory Visit: Payer: Medicaid Other

## 2018-03-16 ENCOUNTER — Ambulatory Visit: Payer: Medicaid Other | Admitting: Obstetrics

## 2018-03-29 ENCOUNTER — Ambulatory Visit: Payer: Medicaid Other | Admitting: Obstetrics

## 2018-03-29 NOTE — Progress Notes (Deleted)
Last PAP: 08/03/2016 Negative

## 2018-03-30 ENCOUNTER — Encounter: Payer: Self-pay | Admitting: Obstetrics

## 2018-06-16 ENCOUNTER — Encounter (HOSPITAL_COMMUNITY): Payer: Self-pay

## 2018-06-16 ENCOUNTER — Ambulatory Visit (HOSPITAL_COMMUNITY)
Admission: EM | Admit: 2018-06-16 | Discharge: 2018-06-16 | Disposition: A | Discharge status: Home / Self Care | Payer: Medicaid Other | Attending: Family Medicine | Admitting: Family Medicine

## 2018-06-16 DIAGNOSIS — H0012 Chalazion right lower eyelid: Secondary | ICD-10-CM

## 2018-06-16 MED ORDER — SULFAMETHOXAZOLE-TRIMETHOPRIM 800-160 MG PO TABS: 1.0000 | ORAL_TABLET | Freq: Two times a day (BID) | ORAL | 0 refills | Status: AC

## 2018-06-16 NOTE — Discharge Instructions (Signed)
Warm compresses 3-4 times a day.  Complete course of antibiotics.  Tylenol and/or ibuprofen as needed for pain.  Lid scrubs daily.  Follow up with ophthalmology as needed if no improvement or worsening in the next week.

## 2018-06-16 NOTE — ED Provider Notes (Signed)
[facility]    CSN: [id number] Arrival date & time: 06/16/18  0831     History   Chief Complaint Chief Complaint  Patient presents with  . Facial Swelling    HPI Brooke Lucas is a 33 y.o. female.   Brooke Lucas presents with right lower eye lid swelling and pain. Started three days ago and worse this morning. Has had similar to upper lids in the past, this is the first time to lower lid. Clear drainage. No fevers. Blurred vision at times. Minimal eye ball pain but feels irritated. Denies any specific injury or foreign body. Denies wearing makeup/lashes etc. Has tried compresses which have not helped. No fevers. No other URI symptoms. Doesn't wear contacts or glasses. Without contributing medical history.      ROS per HPI, negative if not otherwise mentioned.      Past Medical History:  Diagnosis Date  . Anxiety   . Depression    after loss of son  . Sickle cell trait (HCC)   . Trichomonas     Patient Active Problem List   Diagnosis Date Noted  . History of bilateral tubal ligation 07/24/2016  . Tobacco abuse 07/24/2016  . Unwanted fertility 04/24/2016    Past Surgical History:  Procedure Laterality Date  . extraction wisdom    . TUBAL LIGATION N/A 05/22/2016   Procedure: POST PARTUM TUBAL LIGATION;  Surgeon: Hermina Staggers, MD;  Location: [facility];  Service: Gynecology;  Laterality: N/A;    OB History    Gravida  3   Para  3   Term  3   Preterm  0   AB  0   Living  2     SAB  0   TAB  0   Ectopic  0   Multiple  0   Live Births  2            Home Medications    Prior to Admission medications   Medication Sig Start Date End Date Taking? Authorizing Provider  ibuprofen (ADVIL,MOTRIN) 600 MG tablet Take 1 tablet (600 mg total) by mouth every 6 (six) hours. 05/23/16   Allie Bossier, MD  sulfamethoxazole-trimethoprim (BACTRIM DS) 800-160 MG tablet Take 1 tablet by mouth 2 (two) times daily for 7 days. 06/16/18  06/23/18  Georgetta Haber, NP    Family History Family History  Problem Relation Age of Onset  . Cancer Brother   . Birth defects Son        heart  . Heart disease Maternal Grandmother   . Arthritis Paternal Grandmother   . Other Neg Hx     Social History Social History   Tobacco Use  . Smoking status: Current Every Day Smoker    Packs/day: 0.25    Years: 0.00    Pack years: 0.00    Types: Cigarettes  . Smokeless tobacco: Never Used  . Tobacco comment: 1/2 pack per day  Substance Use Topics  . Alcohol use: No    Alcohol/week: 0.0 standard drinks    Comment: Socially  . Drug use: Yes    Types: Marijuana    Comment: PT DENIES BUT +UDS 05/22/16     Allergies   Amoxicillin; Penicillins; and Benadryl [diphenhydramine]   Review of Systems Review of Systems   Physical Exam Triage Vital Signs ED Triage Vitals  Enc Vitals Group     BP 06/16/18 0836 122/83     Pulse Rate 06/16/18 0836 69  Resp 06/16/18 0836 20     Temp 06/16/18 0836 98.7 F (37.1 C)     Temp src --      SpO2 06/16/18 0836 100 %     Weight --      Height --      Head Circumference --      Peak Flow --      Pain Score 06/16/18 0834 8     Pain Loc --      Pain Edu? --      Excl. in [facility]? --    No data found.  Updated Vital Signs BP 122/83   Pulse 69   Temp 98.7 F (37.1 C)   Resp 20   LMP 06/16/2018   SpO2 100%   Physical Exam Constitutional:      General: She is not in acute distress.    Appearance: She is well-developed.  HENT:     Head: Normocephalic and atraumatic.     Right Ear: Tympanic membrane, ear canal and external ear normal.     Left Ear: Tympanic membrane, ear canal and external ear normal.     Nose: Nose normal.     Mouth/Throat:     Pharynx: Uvula midline.     Tonsils: No tonsillar exudate.  Eyes:     General:        Right eye: Hordeolum present.     Extraocular Movements: Extraocular movements intact.     Conjunctiva/sclera: Conjunctivae normal.     Pupils:  Pupils are equal, round, and reactive to light.     Comments: Right lower lid with firm swelling distal to lashes, mild redness; no drainage; eyeball WNL   Cardiovascular:     Rate and Rhythm: Normal rate and regular rhythm.     Heart sounds: Normal heart sounds.  Pulmonary:     Effort: Pulmonary effort is normal.     Breath sounds: Normal breath sounds.  Skin:    General: Skin is warm and dry.  Neurological:     Mental Status: She is alert and oriented to person, place, and time.      UC Treatments / Results  Labs (all labs ordered are listed, but only abnormal results are displayed) Labs Reviewed - No data to display  EKG None  Radiology No results found.  Procedures Procedures (including critical care time)  Medications Ordered in UC Medications - No data to display  Initial Impression / Assessment and Plan / UC Course  I have reviewed the triage vital signs and the nursing notes.  Pertinent labs & imaging results that were available during my care of the patient were reviewed by me and considered in my medical decision making (see chart for details).     Chalazion to right lower lid, quite large, firm, tender. Opted to provide oral antibiotics at this time, encouraged supportive cares for comfort including compresses. Follow up with ophthalmology as may need injection if no improvement. Patient verbalized understanding and agreeable to plan.    Final Clinical Impressions(s) / UC Diagnoses   Final diagnoses:  Chalazion of right lower eyelid     Discharge Instructions     Warm compresses 3-4 times a day.  Complete course of antibiotics.  Tylenol and/or ibuprofen as needed for pain.  Lid scrubs daily.  Follow up with ophthalmology as needed if no improvement or worsening in the next week.    ED Prescriptions    Medication Sig Dispense Auth. Provider   sulfamethoxazole-trimethoprim (BACTRIM DS) 800-160  MG tablet Take 1 tablet by mouth 2 (two) times daily  for 7 days. 14 tablet Georgetta Haber, NP     Controlled Substance Prescriptions [place] Controlled Substance Registry consulted? Not Applicable   Georgetta Haber, NP 06/16/18 [phone]

## 2018-06-16 NOTE — ED Notes (Signed)
Patient able to ambulate independently  

## 2018-06-16 NOTE — ED Triage Notes (Signed)
Pt began having right eye swelling 2 days ago, right eye is red and swollen,

## 2018-06-17 ENCOUNTER — Ambulatory Visit (HOSPITAL_COMMUNITY)
Admission: EM | Admit: 2018-06-17 | Discharge: 2018-06-17 | Disposition: A | Discharge status: Home / Self Care | Payer: Self-pay | Attending: Internal Medicine | Admitting: Internal Medicine

## 2018-06-17 ENCOUNTER — Other Ambulatory Visit: Payer: Self-pay

## 2018-06-17 ENCOUNTER — Encounter (HOSPITAL_COMMUNITY): Payer: Self-pay

## 2018-06-17 DIAGNOSIS — L03213 Periorbital cellulitis: Secondary | ICD-10-CM

## 2018-06-17 MED ORDER — KETOROLAC TROMETHAMINE 0.5 % OP SOLN: 1.0000 [drp] | Freq: Four times a day (QID) | OPHTHALMIC | 0 refills | Status: AC

## 2018-06-17 NOTE — ED Triage Notes (Signed)
Pt was seen earlier in the week for right eye swelling, selling has worsened after treatment and swelling extending into jaw

## 2018-06-17 NOTE — ED Provider Notes (Signed)
[facility]    CSN: [id number] Arrival date & time: 06/17/18  1410     History   Chief Complaint Chief Complaint  Patient presents with  . Facial Swelling    HPI Brooke Lucas is a 33 y.o. female seen in urgent care for an infected chalazion of the right lower eyelid returns to urgent care with increased swelling in the right lower eyelid.  Patient was prescribed Bactrim and has taken 2 doses so far.  She is tolerating Bactrim.  She has been applying warm compresses for 20 minutes at a time.  She admits to having some photosensitivity.  No difficulty with extraocular movement.  No blurry vision.  Increased tearing of the right eye.  No fever or chills.   HPI  Past Medical History:  Diagnosis Date  . Anxiety   . Depression    after loss of son  . Sickle cell trait (HCC)   . Trichomonas     Patient Active Problem List   Diagnosis Date Noted  . History of bilateral tubal ligation 07/24/2016  . Tobacco abuse 07/24/2016  . Unwanted fertility 04/24/2016    Past Surgical History:  Procedure Laterality Date  . extraction wisdom    . TUBAL LIGATION N/A 05/22/2016   Procedure: POST PARTUM TUBAL LIGATION;  Surgeon: Hermina Staggers, MD;  Location: [facility];  Service: Gynecology;  Laterality: N/A;    OB History    Gravida  3   Para  3   Term  3   Preterm  0   AB  0   Living  2     SAB  0   TAB  0   Ectopic  0   Multiple  0   Live Births  2            Home Medications    Prior to Admission medications   Medication Sig Start Date End Date Taking? Authorizing Provider  ibuprofen (ADVIL,MOTRIN) 600 MG tablet Take 1 tablet (600 mg total) by mouth every 6 (six) hours. 05/23/16   Allie Bossier, MD  ketorolac (ACULAR) 0.5 % ophthalmic solution Place 1 drop into the right eye every 6 (six) hours for 3 days. 06/17/18 06/20/18  Merrilee Jansky, MD  sulfamethoxazole-trimethoprim (BACTRIM DS) 800-160 MG tablet Take 1 tablet by mouth 2 (two)  times daily for 7 days. 06/16/18 06/23/18  Georgetta Haber, NP    Family History Family History  Problem Relation Age of Onset  . Cancer Brother   . Birth defects Son        heart  . Heart disease Maternal Grandmother   . Arthritis Paternal Grandmother   . Other Neg Hx     Social History Social History   Tobacco Use  . Smoking status: Current Every Day Smoker    Packs/day: 0.25    Years: 0.00    Pack years: 0.00    Types: Cigarettes  . Smokeless tobacco: Never Used  . Tobacco comment: 1/2 pack per day  Substance Use Topics  . Alcohol use: No    Alcohol/week: 0.0 standard drinks    Comment: Socially  . Drug use: Yes    Types: Marijuana    Comment: PT DENIES BUT +UDS 05/22/16     Allergies   Amoxicillin; Penicillins; and Benadryl [diphenhydramine]   Review of Systems Review of Systems  Constitutional: Negative for activity change, appetite change, fatigue and fever.  HENT: Positive for congestion and rhinorrhea. Negative for  sore throat and tinnitus.   Eyes: Negative.   Respiratory: Negative.   Cardiovascular: Negative for chest pain and palpitations.  Gastrointestinal: Negative for abdominal distention, constipation and diarrhea.  Genitourinary: Negative.   Allergic/Immunologic: Negative.   Neurological: Negative.   All other systems reviewed and are negative.    Physical Exam Triage Vital Signs ED Triage Vitals  Enc Vitals Group     BP 06/17/18 1435 135/75     Pulse Rate 06/17/18 1435 73     Resp 06/17/18 1435 20     Temp 06/17/18 1435 98.3 F (36.8 C)     Temp src --      SpO2 06/17/18 1435 100 %     Weight --      Height --      Head Circumference --      Peak Flow --      Pain Score 06/17/18 1433 10     Pain Loc --      Pain Edu? --      Excl. in GC? --    No data found.  Updated Vital Signs BP 135/75   Pulse 73   Temp 98.3 F (36.8 C)   Resp 20   LMP 06/16/2018   SpO2 100%   Visual Acuity Right Eye Distance:   Left Eye Distance:    Bilateral Distance:    Right Eye Near:   Left Eye Near:    Bilateral Near:     Physical Exam HENT:     Right Ear: Tympanic membrane normal.     Left Ear: Tympanic membrane normal.     Mouth/Throat:     Mouth: Mucous membranes are moist.  Eyes:     General: No scleral icterus.       Right eye: Discharge present.     Extraocular Movements: Extraocular movements intact.     Pupils: Pupils are equal, round, and reactive to light.     Comments: Cervical lymphadenopathy on the right side  Neck:     Musculoskeletal: Normal range of motion.  Cardiovascular:     Rate and Rhythm: Normal rate and regular rhythm.  Pulmonary:     Effort: Pulmonary effort is normal.     Breath sounds: Normal breath sounds.  Abdominal:     General: Bowel sounds are normal.     Palpations: Abdomen is soft.  Skin:    Capillary Refill: Capillary refill takes less than 2 seconds.      UC Treatments / Results  Labs (all labs ordered are listed, but only abnormal results are displayed) Labs Reviewed - No data to display  EKG None  Radiology No results found.  Procedures Procedures (including critical care time)  Medications Ordered in UC Medications - No data to display  Initial Impression / Assessment and Plan / UC Course  I have reviewed the triage vital signs and the nursing notes.  Pertinent labs & imaging results that were available during my care of the patient were reviewed by me and considered in my medical decision making (see chart for details).     1.  Preseptal cellulitis secondary to an infected chalazion: Ophthalmic analgesic Fluorescein staining is negative for corneal abnormalities Decrease duration of warm compress to 5 to 10 minutes at a time Follow-up with ophthalmology as previously advised Final Clinical Impressions(s) / UC Diagnoses   Final diagnoses:  Preseptal cellulitis of right lower eyelid   Discharge Instructions   None    ED Prescriptions  Medication Sig Dispense Auth. Provider   ketorolac (ACULAR) 0.5 % ophthalmic solution Place 1 drop into the right eye every 6 (six) hours for 3 days. 5 mL Lamptey, Britta Mccreedy, MD     Controlled Substance Prescriptions [place] Controlled Substance Registry consulted? No   Merrilee Jansky, MD 06/17/18 1610

## 2018-08-18 ENCOUNTER — Other Ambulatory Visit: Payer: Self-pay | Admitting: *Deleted

## 2018-08-18 DIAGNOSIS — Z20822 Contact with and (suspected) exposure to covid-19: Secondary | ICD-10-CM

## 2018-08-23 LAB — NOVEL CORONAVIRUS, NAA: SARS-CoV-2, NAA: NOT DETECTED

## 2018-08-26 ENCOUNTER — Ambulatory Visit: Payer: Self-pay | Admitting: Obstetrics

## 2018-09-07 ENCOUNTER — Ambulatory Visit: Payer: Self-pay | Admitting: Obstetrics

## 2018-09-18 ENCOUNTER — Encounter (HOSPITAL_COMMUNITY): Payer: Self-pay

## 2018-09-18 ENCOUNTER — Ambulatory Visit (HOSPITAL_COMMUNITY)
Admission: EM | Admit: 2018-09-18 | Discharge: 2018-09-18 | Disposition: A | Discharge status: Home / Self Care | Payer: Self-pay | Attending: Family Medicine | Admitting: Family Medicine

## 2018-09-18 ENCOUNTER — Other Ambulatory Visit: Payer: Self-pay

## 2018-09-18 DIAGNOSIS — N390 Urinary tract infection, site not specified: Secondary | ICD-10-CM | POA: Insufficient documentation

## 2018-09-18 DIAGNOSIS — Z3202 Encounter for pregnancy test, result negative: Secondary | ICD-10-CM

## 2018-09-18 DIAGNOSIS — R21 Rash and other nonspecific skin eruption: Secondary | ICD-10-CM | POA: Insufficient documentation

## 2018-09-18 LAB — POCT URINALYSIS DIP (DEVICE)
Bilirubin Urine: NEGATIVE
Glucose, UA: NEGATIVE mg/dL
Ketones, ur: NEGATIVE mg/dL
Nitrite: POSITIVE — AB
Protein, ur: NEGATIVE mg/dL
Specific Gravity, Urine: 1.025 (ref 1.005–1.030)
Urobilinogen, UA: 0.2 mg/dL (ref 0.0–1.0)
pH: 6.5 (ref 5.0–8.0)

## 2018-09-18 LAB — POCT PREGNANCY, URINE: Preg Test, Ur: NEGATIVE

## 2018-09-18 MED ORDER — NITROFURANTOIN MONOHYD MACRO 100 MG PO CAPS: 100.0000 mg | ORAL_CAPSULE | Freq: Two times a day (BID) | ORAL | 0 refills | Status: AC

## 2018-09-18 MED ORDER — FLUCONAZOLE 150 MG PO TABS: ORAL_TABLET | ORAL | 0 refills | Status: DC

## 2018-09-18 MED ORDER — VALACYCLOVIR HCL 1 G PO TABS: 1000.0000 mg | ORAL_TABLET | Freq: Two times a day (BID) | ORAL | 0 refills | Status: AC

## 2018-09-18 NOTE — ED Triage Notes (Signed)
Pt present urinary frequency and a rash that is located in between her vaginal and anus. Pt states that when she urinate it has a foul odor. The rash between her anus and vaginal is raw and burns when she sweats.

## 2018-09-18 NOTE — ED Provider Notes (Signed)
[facility]    CSN: [id number] Arrival date & time: 09/18/18  1549      History   Chief Complaint Chief Complaint  Patient presents with  . Urinary Frequency  . Rash    HPI Brooke Lucas is a 33 y.o. female.   Brooke Lucas presents with complaints of odor to urine with urgency to urinate, which started approximately 1 week ago. No back pain or pelvic pain. No burning with urination. Has had UTI's in the past. No blood in urine. No nausea, vomiting or diarrhea. States she also has a "rash" near rectum which she first noted after having a BM approximately 8/4. States she also was wearing thong underwear which she feels may have been irritating to the area. It now feels painful, burning if touched such as with wiping. No itching. Denies any previous similar. States she has been applying a&D ointment to the area which has not helped. No known potential std exposure. Sexually active with condoms. History of anxiety and depression, tubal ligation.     ROS per HPI, negative if not otherwise mentioned.      Past Medical History:  Diagnosis Date  . Anxiety   . Depression    after loss of son  . Sickle cell trait ([place])   . Trichomonas     Patient Active Problem List   Diagnosis Date Noted  . History of bilateral tubal ligation 07/24/2016  . Tobacco abuse 07/24/2016  . Unwanted fertility 04/24/2016    Past Surgical History:  Procedure Laterality Date  . extraction wisdom    . TUBAL LIGATION N/A 05/22/2016   Procedure: POST PARTUM TUBAL LIGATION;  Surgeon: Chancy Milroy, MD;  Location: [place];  Service: Gynecology;  Laterality: N/A;    OB History    Gravida  3   Para  3   Term  3   Preterm  0   AB  0   Living  2     SAB  0   TAB  0   Ectopic  0   Multiple  0   Live Births  2            Home Medications    Prior to Admission medications   Medication Sig Start Date End Date Taking? Authorizing Provider  fluconazole  (DIFLUCAN) 150 MG tablet Take as needed at completion of antibiotics for yeast 09/18/18   Augusto Gamble B, NP  ibuprofen (ADVIL,MOTRIN) 600 MG tablet Take 1 tablet (600 mg total) by mouth every 6 (six) hours. 05/23/16   Emily Filbert, MD  nitrofurantoin, macrocrystal-monohydrate, (MACROBID) 100 MG capsule Take 1 capsule (100 mg total) by mouth 2 (two) times daily for 5 days. 09/18/18 09/23/18  Zigmund Gottron, NP  valACYclovir (VALTREX) 1000 MG tablet Take 1 tablet (1,000 mg total) by mouth 2 (two) times daily for 7 days. 09/18/18 09/25/18  Zigmund Gottron, NP    Family History Family History  Problem Relation Age of Onset  . Cancer Brother   . Birth defects Son        heart  . Heart disease Maternal Grandmother   . Arthritis Paternal Grandmother   . Other Neg Hx     Social History Social History   Tobacco Use  . Smoking status: Current Every Day Smoker    Packs/day: 0.25    Years: 0.00    Pack years: 0.00    Types: Cigarettes  . Smokeless tobacco: Never Used  .  Tobacco comment: 1/2 pack per day  Substance Use Topics  . Alcohol use: No    Alcohol/week: 0.0 standard drinks    Comment: Socially  . Drug use: Yes    Types: Marijuana    Comment: PT DENIES BUT +UDS 05/22/16     Allergies   Amoxicillin, Penicillins, and Benadryl [diphenhydramine]   Review of Systems Review of Systems   Physical Exam Triage Vital Signs ED Triage Vitals  Enc Vitals Group     BP 09/18/18 1603 98/67     Pulse Rate 09/18/18 1603 90     Resp 09/18/18 1603 16     Temp 09/18/18 1603 98 F (36.7 C)     Temp Source 09/18/18 1603 Oral     SpO2 09/18/18 1603 99 %     Weight --      Height --      Head Circumference --      Peak Flow --      Pain Score 09/18/18 1606 0     Pain Loc --      Pain Edu? --      Excl. in GC? --    No data found.  Updated Vital Signs BP 98/67 (BP Location: Left Arm)   Pulse 90   Temp 98 F (36.7 C) (Oral)   Resp 16   LMP 08/30/2018   SpO2 99%    Physical  Exam Constitutional:      General: She is not in acute distress.    Appearance: She is well-developed.  Cardiovascular:     Rate and Rhythm: Normal rate.  Pulmonary:     Effort: Pulmonary effort is normal.  Abdominal:     Tenderness: There is no right CVA tenderness or left CVA tenderness.  Genitourinary:    Labia:        Right: No rash or tenderness.        Left: No rash or tenderness.        Comments: Approximately 3 circular ulceration appearing lesions to perineum; not raised or red; no active drainage; tender; culture collected; no apparent external vaginal discharge  Skin:    General: Skin is warm and dry.  Neurological:     Mental Status: She is alert and oriented to person, place, and time.      UC Treatments / Results  Labs (all labs ordered are listed, but only abnormal results are displayed) Labs Reviewed  POCT URINALYSIS DIP (DEVICE) - Abnormal; Notable for the following components:      Result Value   Hgb urine dipstick TRACE (*)    Nitrite POSITIVE (*)    Leukocytes,Ua TRACE (*)    All other components within normal limits  HSV CULTURE AND TYPING  URINE CULTURE  POC URINE PREG, ED  POCT PREGNANCY, URINE  CERVICOVAGINAL ANCILLARY ONLY    EKG   Radiology No results found.  Procedures Procedures (including critical care time)  Medications Ordered in UC Medications - No data to display  Initial Impression / Assessment and Plan / UC Course  I have reviewed the triage vital signs and the nursing notes.  Pertinent labs & imaging results that were available during my care of the patient were reviewed by me and considered in my medical decision making (see chart for details).     Urine consistent with uti with macrobid initiated. Herpes culture obtained due to presence of painful ulcerations. Acyclovir initiated pending culture. If symptoms worsen or do not improve in the next week to  return to be seen or to follow up with PCP.  Patient verbalized  understanding and agreeable to plan.   Final Clinical Impressions(s) / UC Diagnoses   Final diagnoses:  Lower urinary tract infectious disease  Vulvar rash     Discharge Instructions     Drink plenty of water to empty bladder regularly. Avoid alcohol and caffeine as these may irritate the bladder.   Complete course of antibiotics.  May take the yeast pill after antibiotics as needed. Your vaginal swab will also confirm if yeast is present.  I recommend starting medication for herpes, as I am concerned about that with the lesions to your perineum.  Will notify you of any positive findings and if any changes to treatment are needed.   No matter I would withhold from intercourse until these have healed.    ED Prescriptions    Medication Sig Dispense Auth. Provider   nitrofurantoin, macrocrystal-monohydrate, (MACROBID) 100 MG capsule Take 1 capsule (100 mg total) by mouth 2 (two) times daily for 5 days. 10 capsule Linus MakoBurky, Berel Najjar B, NP   fluconazole (DIFLUCAN) 150 MG tablet Take as needed at completion of antibiotics for yeast 1 tablet Linus MakoBurky, Jaevon Paras B, NP   valACYclovir (VALTREX) 1000 MG tablet Take 1 tablet (1,000 mg total) by mouth 2 (two) times daily for 7 days. 14 tablet Georgetta HaberBurky, Sohail Capraro B, NP     Controlled Substance Prescriptions [place] Controlled Substance Registry consulted? Not Applicable   Georgetta HaberBurky, Bulmaro Feagans B, NP 09/18/18 [id number]

## 2018-09-18 NOTE — Discharge Instructions (Signed)
Drink plenty of water to empty bladder regularly. Avoid alcohol and caffeine as these may irritate the bladder.   Complete course of antibiotics.  May take the yeast pill after antibiotics as needed. Your vaginal swab will also confirm if yeast is present.  I recommend starting medication for herpes, as I am concerned about that with the lesions to your perineum.  Will notify you of any positive findings and if any changes to treatment are needed.   No matter I would withhold from intercourse until these have healed.

## 2018-09-20 LAB — URINE CULTURE: Culture: >=100000 — AB

## 2018-09-21 LAB — HSV CULTURE AND TYPING

## 2018-09-23 LAB — CERVICOVAGINAL ANCILLARY ONLY
Candida vaginitis: POSITIVE — AB
Chlamydia: NEGATIVE
Neisseria Gonorrhea: NEGATIVE
Trichomonas: NEGATIVE

## 2018-09-24 ENCOUNTER — Telehealth (HOSPITAL_COMMUNITY): Payer: Self-pay | Admitting: Emergency Medicine

## 2018-09-24 MED ORDER — METRONIDAZOLE 500 MG PO TABS: 500.0000 mg | ORAL_TABLET | Freq: Two times a day (BID) | ORAL | 0 refills | Status: AC

## 2018-09-24 NOTE — Telephone Encounter (Signed)
Candida (yeast) is positive.  Prescription for fluconazole was given at the urgent care visit.    Bacterial vaginosis is positive. This was not treated at the urgent care visit.  Flagyl 500 mg BID x 7 days #14 no refills sent to patients pharmacy of choice.    Patient contacted and made aware of    results, all questions answered  Pt informed of urine culture and neg hsv.

## 2018-11-01 ENCOUNTER — Ambulatory Visit (HOSPITAL_COMMUNITY)
Admission: EM | Admit: 2018-11-01 | Discharge: 2018-11-01 | Disposition: A | Discharge status: Home / Self Care | Payer: 59 | Attending: Family Medicine | Admitting: Family Medicine

## 2018-11-01 ENCOUNTER — Other Ambulatory Visit: Payer: Self-pay | Admitting: Urgent Care

## 2018-11-01 ENCOUNTER — Encounter (HOSPITAL_COMMUNITY): Payer: Self-pay | Admitting: Urgent Care

## 2018-11-01 ENCOUNTER — Other Ambulatory Visit: Payer: Self-pay

## 2018-11-01 DIAGNOSIS — Z20828 Contact with and (suspected) exposure to other viral communicable diseases: Secondary | ICD-10-CM | POA: Insufficient documentation

## 2018-11-01 DIAGNOSIS — Z7251 High risk heterosexual behavior: Secondary | ICD-10-CM | POA: Insufficient documentation

## 2018-11-01 DIAGNOSIS — F17210 Nicotine dependence, cigarettes, uncomplicated: Secondary | ICD-10-CM | POA: Insufficient documentation

## 2018-11-01 DIAGNOSIS — J029 Acute pharyngitis, unspecified: Secondary | ICD-10-CM | POA: Diagnosis present

## 2018-11-01 DIAGNOSIS — D573 Sickle-cell trait: Secondary | ICD-10-CM | POA: Insufficient documentation

## 2018-11-01 LAB — POCT RAPID STREP A: Streptococcus, Group A Screen (Direct): NEGATIVE

## 2018-11-01 MED ORDER — AZITHROMYCIN 250 MG PO TABS: ORAL_TABLET | ORAL | 0 refills | Status: DC

## 2018-11-01 NOTE — ED Triage Notes (Signed)
Pt presents with sore throat X 2 weeks. 

## 2018-11-01 NOTE — ED Provider Notes (Signed)
MRN: [id number] DOB: Feb 23, 1985  Subjective:   Brooke Lucas is a 33 y.o. female presenting for 2-week history acute onset worsening constant throat pain with difficulty swallowing.  Has tried multiple over-the-counter medications without relief.  Patient had COVID testing at the start of her symptoms, was negative.  She has a son with chronic heart conditions that put him at risk, has practice social distancing and has low suspicion for COVID-19.  She is agreeable to rechecking however.  She would also like to have testing for STIs as she recently had sex in the past few days and states that her throat symptoms got worse thereafter. Patient is a smoker.    No current facility-administered medications for this encounter.   Current Outpatient Medications:  .  fluconazole (DIFLUCAN) 150 MG tablet, Take as needed at completion of antibiotics for yeast, Disp: 1 tablet, Rfl: 0 .  ibuprofen (ADVIL,MOTRIN) 600 MG tablet, Take 1 tablet (600 mg total) by mouth every 6 (six) hours., Disp: 30 tablet, Rfl: 0    Allergies  Allergen Reactions  . Amoxicillin Anaphylaxis    Has patient had a PCN reaction causing immediate rash, facial/tongue/throat swelling, SOB or lightheadedness with hypotension: Yes Has patient had a PCN reaction causing severe rash involving mucus membranes or skin necrosis: Yes Has patient had a PCN reaction that required hospitalization No Has patient had a PCN reaction occurring within the last 10 years: No If all of the above answers are "NO", then may proceed with Cephalosporin use.  Marland Kitchen Penicillins Anaphylaxis    Has patient had a PCN reaction causing immediate rash, facial/tongue/throat swelling, SOB or lightheadedness with hypotension: Yes Has patient had a PCN reaction causing severe rash involving mucus membranes or skin necrosis: Yes Has patient had a PCN reaction that required hospitalization No Has patient had a PCN reaction occurring within the last 10 years: No If all  of the above answers are "NO", then may proceed with Cephalosporin use.  . Benadryl [Diphenhydramine] Anxiety    Past Medical History:  Diagnosis Date  . Anxiety   . Depression    after loss of son  . Sickle cell trait (HCC)   . Trichomonas      Past Surgical History:  Procedure Laterality Date  . extraction wisdom    . TUBAL LIGATION N/A 05/22/2016   Procedure: POST PARTUM TUBAL LIGATION;  Surgeon: Hermina Staggers, MD;  Location: [facility];  Service: Gynecology;  Laterality: N/A;    Review of Systems  Constitutional: Negative for fever and malaise/fatigue.  HENT: Positive for sore throat. Negative for congestion, ear pain and sinus pain.   Eyes: Negative for discharge and redness.  Respiratory: Negative for cough, hemoptysis, shortness of breath and wheezing.   Cardiovascular: Negative for chest pain.  Gastrointestinal: Negative for abdominal pain, diarrhea, nausea and vomiting.  Genitourinary: Negative for dysuria, flank pain and hematuria.  Musculoskeletal: Negative for myalgias.  Skin: Negative for rash.  Neurological: Negative for dizziness, weakness and headaches.  Psychiatric/Behavioral: Negative for depression and substance abuse.    Objective:   Vitals: BP 112/72 (BP Location: Left Arm)   Pulse 69   Temp 98.1 F (36.7 C) (Temporal)   Resp 16   SpO2 100%   Physical Exam Constitutional:      General: She is not in acute distress.    Appearance: Normal appearance. She is well-developed. She is not ill-appearing.  HENT:     Head: Normocephalic and atraumatic.     Nose: Nose  normal.     Mouth/Throat:     Mouth: Mucous membranes are moist.     Pharynx: Pharyngeal swelling, oropharyngeal exudate and posterior oropharyngeal erythema present.     Comments: Swelling, erythema and exudate over right tonsil only. Eyes:     General: No scleral icterus.    Extraocular Movements: Extraocular movements intact.     Pupils: Pupils are equal, round, and  reactive to light.  Cardiovascular:     Rate and Rhythm: Normal rate.  Pulmonary:     Effort: Pulmonary effort is normal.  Skin:    General: Skin is warm and dry.  Neurological:     General: No focal deficit present.     Mental Status: She is alert and oriented to person, place, and time.  Psychiatric:        Mood and Affect: Mood normal.        Behavior: Behavior normal.     Results for orders placed or performed during the hospital encounter of 11/01/18 (from the past 24 hour(s))  POCT rapid strep A [facility])     Status: None   Collection Time: 11/01/18 11:26 AM  Result Value Ref Range   Streptococcus, Group A Screen (Direct) NEGATIVE NEGATIVE    Assessment and Plan :   1. Acute pharyngitis, unspecified etiology   2. Sore throat   3. Unprotected sex     Will cover for pharyngitis with azithromycin given her allergies to amoxicillin, penicillins.  COVID-19, STI testing pending. Counseled patient on potential for adverse effects with medications prescribed/recommended today, ER and return-to-clinic precautions discussed, patient verbalized understanding.    Jaynee Eagles, [place] 11/01/18 1132

## 2018-11-01 NOTE — Discharge Instructions (Addendum)
For sore throat or cough try using a honey-based tea. Use 3 teaspoons of honey with juice squeezed from half lemon. Place shaved pieces of ginger into 1/2-1 cup of water and warm over stove top. Then mix the ingredients and repeat every 4 hours as needed. Please take Tylenol 500mg every 6 hours. Hydrate very well with at least 2 liters of water. Eat light meals such as soups to replenish electrolytes and soft fruits, veggies. Start an antihistamine like Zyrtec, Allegra or Claritin for postnasal drainage, sinus congestion.  You can take this together with pseudoephedrine (Sudafed) at a dose of 60 mg 3 times a day oral 120 mg twice daily as needed for the same kind of congestion.  However, do not take Sudafed if you have high blood pressure or are prone to palpitations, have abnormal heart rhythms. ° °

## 2018-11-02 LAB — CERVICOVAGINAL ANCILLARY ONLY
Bacterial Vaginitis (gardnerella): POSITIVE — AB
Candida Glabrata: NEGATIVE
Candida Vaginitis: POSITIVE — AB
Molecular Disclaimer: NEGATIVE
Molecular Disclaimer: NEGATIVE
Molecular Disclaimer: NEGATIVE
Molecular Disclaimer: NORMAL
Trichomonas: NEGATIVE

## 2018-11-03 ENCOUNTER — Other Ambulatory Visit (HOSPITAL_COMMUNITY): Payer: Self-pay | Admitting: Emergency Medicine

## 2018-11-03 ENCOUNTER — Encounter (HOSPITAL_COMMUNITY): Payer: Self-pay | Admitting: Emergency Medicine

## 2018-11-03 ENCOUNTER — Telehealth (HOSPITAL_COMMUNITY): Payer: Self-pay | Admitting: Emergency Medicine

## 2018-11-03 ENCOUNTER — Other Ambulatory Visit: Payer: Self-pay

## 2018-11-03 ENCOUNTER — Ambulatory Visit (HOSPITAL_COMMUNITY)
Admission: EM | Admit: 2018-11-03 | Discharge: 2018-11-03 | Disposition: A | Discharge status: Home / Self Care | Payer: 59 | Attending: Emergency Medicine | Admitting: Emergency Medicine

## 2018-11-03 DIAGNOSIS — J029 Acute pharyngitis, unspecified: Secondary | ICD-10-CM | POA: Diagnosis present

## 2018-11-03 LAB — NOVEL CORONAVIRUS, NAA (HOSP ORDER, SEND-OUT TO REF LAB; TAT 18-24 HRS): SARS-CoV-2, NAA: NOT DETECTED

## 2018-11-03 LAB — CERVICOVAGINAL ANCILLARY ONLY
Chlamydia: POSITIVE — AB
Neisseria Gonorrhea: NEGATIVE

## 2018-11-03 MED ORDER — FLUTICASONE PROPIONATE 50 MCG/ACT NA SUSP: 1.0000 | Freq: Every day | NASAL | 0 refills | Status: DC

## 2018-11-03 MED ORDER — PREDNISONE 50 MG PO TABS: 50.0000 mg | ORAL_TABLET | Freq: Every day | ORAL | 0 refills | Status: AC

## 2018-11-03 MED ORDER — LIDOCAINE VISCOUS HCL 2 % MT SOLN: 10.0000 mL | Freq: Four times a day (QID) | OROMUCOSAL | 0 refills | Status: DC | PRN

## 2018-11-03 MED ORDER — FLUCONAZOLE 150 MG PO TABS: 150.0000 mg | ORAL_TABLET | Freq: Once | ORAL | 0 refills | Status: DC

## 2018-11-03 MED ORDER — CETIRIZINE HCL 10 MG PO CAPS: 10.0000 mg | ORAL_CAPSULE | Freq: Every day | ORAL | 0 refills | Status: DC

## 2018-11-03 NOTE — Discharge Instructions (Addendum)
Please begin daily cetirizine and Flonase nasal spray to help with allergies and congestion/drainage that may be contributing to throat discomfort Prednisone daily with food for the next 5 days to help with tonsil swelling May use lidocaine as needed for discomfort Finish course of azithromycin  Follow-up with ENT if symptoms persisting  Sent in 2 tablets of Diflucan to treat for yeast, may use metronidazole already prescribed for BV.  Please follow-up if symptoms not resolving or worsening

## 2018-11-03 NOTE — ED Provider Notes (Signed)
[facility]    CSN: [id number] Arrival date & time: 11/03/18  0849      History   Chief Complaint Chief Complaint  Patient presents with   Sore Throat    HPI Brooke Lucas is a 33 y.o. female history of tobacco use, presenting today for evaluation of sore throat.  Patient states that she has had a sore throat for the past 2 weeks.  Over the past 5 days she has had worsening throat discomfort and swelling.  She is also noted to have some pus.  She was seen here 2 days ago with negative strep, negative COVID.  Based off appearance of throat she was initiated on azithromycin due to allergies to treat for pharyngitis.  She has not had improvement with this yet.  She is concerned as she had unprotected oral intercourse.  She had vaginal swab obtained which was negative for GC/chlamydia/trichomoniasis.  Was positive for BV and yeast and patient states that this is normal for her as she gets both after every menstrual cycle.  She has had some mild congestion and notes that she has allergies but does not take anything for allergies.  Denies associated fever cough.  Does notes that she is a daily heavy smoker and is concerned about this being related to her symptoms.  Has tried OTC medicines and lozenges without relief.  HPI  Past Medical History:  Diagnosis Date   Anxiety    Depression    after loss of son   Sickle cell trait (HCC)    Trichomonas     Patient Active Problem List   Diagnosis Date Noted   History of bilateral tubal ligation 07/24/2016   Tobacco abuse 07/24/2016   Unwanted fertility 04/24/2016    Past Surgical History:  Procedure Laterality Date   extraction wisdom     TUBAL LIGATION N/A 05/22/2016   Procedure: POST PARTUM TUBAL LIGATION;  Surgeon: Hermina Staggers, MD;  Location: [facility];  Service: Gynecology;  Laterality: N/A;    OB History    Gravida  3   Para  3   Term  3   Preterm  0   AB  0   Living  2     SAB  0     TAB  0   Ectopic  0   Multiple  0   Live Births  2            Home Medications    Prior to Admission medications   Medication Sig Start Date End Date Taking? Authorizing Provider  acetaminophen (TYLENOL) 325 MG tablet Take 650 mg by mouth every 6 (six) hours as needed.   Yes [provider]  azithromycin (ZITHROMAX) 250 MG tablet Start with 2 tablets today, then 1 daily thereafter. 11/01/18  Yes Wallis Bamberg, PA-C  ibuprofen (ADVIL,MOTRIN) 600 MG tablet Take 1 tablet (600 mg total) by mouth every 6 (six) hours. 05/23/16  Yes Dove, Myra C, MD  Cetirizine HCl 10 MG CAPS Take 1 capsule (10 mg total) by mouth daily. 11/03/18   Tonetta Napoles C, PA-C  fluconazole (DIFLUCAN) 150 MG tablet Take 1 tablet (150 mg total) by mouth once for 1 dose. 11/03/18 11/03/18  Leonore Frankson C, PA-C  fluticasone (FLONASE) 50 MCG/ACT nasal spray Place 1-2 sprays into both nostrils daily for 7 days. 11/03/18 11/10/18  Vicie Cech C, PA-C  lidocaine (XYLOCAINE) 2 % solution Use as directed 10 mLs in the mouth or throat every 6 (  six) hours as needed for mouth pain. 11/03/18   Eero Dini C, PA-C  predniSONE (DELTASONE) 50 MG tablet Take 1 tablet (50 mg total) by mouth daily for 5 days. 11/03/18 11/08/18  Prakash Kimberling, Junius CreamerHallie C, PA-C    Family History Family History  Problem Relation Age of Onset   Cancer Brother    Birth defects Son        heart   Heart disease Maternal Grandmother    Arthritis Paternal Grandmother    Other Neg Hx     Social History Social History   Tobacco Use   Smoking status: Current Every Day Smoker    Packs/day: 0.25    Years: 0.00    Pack years: 0.00    Types: Cigarettes   Smokeless tobacco: Never Used   Tobacco comment: 1/2 pack per day  Substance Use Topics   Alcohol use: No    Alcohol/week: 0.0 standard drinks    Comment: Socially   Drug use: Yes    Types: Marijuana    Comment: PT DENIES BUT +UDS 05/22/16     Allergies   Amoxicillin,  Penicillins, and Benadryl [diphenhydramine]   Review of Systems Review of Systems  Constitutional: Negative for activity change, appetite change, chills, fatigue and fever.  HENT: Positive for congestion, rhinorrhea and sore throat. Negative for ear pain, sinus pressure and trouble swallowing.   Eyes: Negative for discharge and redness.  Respiratory: Negative for cough, chest tightness and shortness of breath.   Cardiovascular: Negative for chest pain.  Gastrointestinal: Negative for abdominal pain, diarrhea, nausea and vomiting.  Musculoskeletal: Negative for myalgias.  Skin: Negative for rash.  Neurological: Negative for dizziness, light-headedness and headaches.     Physical Exam Triage Vital Signs ED Triage Vitals  Enc Vitals Group     BP 11/03/18 0901 131/80     Pulse Rate 11/03/18 0901 84     Resp 11/03/18 0901 16     Temp 11/03/18 0901 98.2 F (36.8 C)     Temp Source 11/03/18 0901 Temporal     SpO2 11/03/18 0901 100 %     Weight --      Height --      Head Circumference --      Peak Flow --      Pain Score 11/03/18 0916 5     Pain Loc --      Pain Edu? --      Excl. in GC? --    No data found.  Updated Vital Signs BP 131/80 (BP Location: Left Arm)    Pulse 84    Temp 98.2 F (36.8 C) (Temporal)    Resp 16    LMP 10/28/2018    SpO2 100%   Visual Acuity Right Eye Distance:   Left Eye Distance:   Bilateral Distance:    Right Eye Near:   Left Eye Near:    Bilateral Near:     Physical Exam Vitals signs and nursing note reviewed.  Constitutional:      General: She is not in acute distress.    Appearance: She is well-developed.  HENT:     Head: Normocephalic and atraumatic.     Ears:     Comments: Bilateral ears without tenderness to palpation of external auricle, tragus and mastoid, EAC's without erythema or swelling, TM's with good bony landmarks and cone of light. Non erythematous.     Nose:     Comments: Nasal mucosa mildly erythematous, bilateral  turbinates swollen  Mouth/Throat:     Comments: Oral mucosa pink and moist, bilateral tonsils enlarged, mild erythema, no exudate. Posterior pharynx patent and nonerythematous, no uvula deviation or swelling. Normal phonation. Eyes:     Conjunctiva/sclera: Conjunctivae normal.  Neck:     Musculoskeletal: Neck supple.  Cardiovascular:     Rate and Rhythm: Normal rate and regular rhythm.     Heart sounds: No murmur.  Pulmonary:     Effort: Pulmonary effort is normal. No respiratory distress.     Breath sounds: Normal breath sounds.     Comments: Breathing comfortably at rest, CTABL, no wheezing, rales or other adventitious sounds auscultated Abdominal:     Palpations: Abdomen is soft.     Tenderness: There is no abdominal tenderness.  Skin:    General: Skin is warm and dry.  Neurological:     Mental Status: She is alert.      UC Treatments / Results  Labs (all labs ordered are listed, but only abnormal results are displayed) Labs Reviewed  CYTOLOGY, (ORAL, ANAL, URETHRAL) ANCILLARY ONLY    EKG   Radiology No results found.  Procedures Procedures (including critical care time)  Medications Ordered in UC Medications - No data to display  Initial Impression / Assessment and Plan / UC Course  I have reviewed the triage vital signs and the nursing notes.  Pertinent labs & imaging results that were available during my care of the patient were reviewed by me and considered in my medical decision making (see chart for details).     Patient has bilateral tonsillar enlargement, currently on azithromycin, exam not suggestive of peritonsillar abscess or need for further antibiotics at this time.  Will have complete course.  Obtaining oral swab for gonorrhea and chlamydia.  Will treat symptomatically and supportively and treat for postnasal drainage with Zyrtec and Flonase, will provide prednisone x5 days to help with swelling, no signs of airway compromise at this time, no soft  palate swelling.  Also provided viscous lidocaine to use as needed.  Follow-up with ENT if symptoms persisting.Discussed strict return precautions. Patient verbalized understanding and is agreeable with plan.  Final Clinical Impressions(s) / UC Diagnoses   Final diagnoses:  Sore throat     Discharge Instructions     Please begin daily cetirizine and Flonase nasal spray to help with allergies and congestion/drainage that may be contributing to throat discomfort Prednisone daily with food for the next 5 days to help with tonsil swelling May use lidocaine as needed for discomfort Finish course of azithromycin  Follow-up with ENT if symptoms persisting  Sent in 2 tablets of Diflucan to treat for yeast, may use metronidazole already prescribed for BV.  Please follow-up if symptoms not resolving or worsening     ED Prescriptions    Medication Sig Dispense Auth. Provider   predniSONE (DELTASONE) 50 MG tablet Take 1 tablet (50 mg total) by mouth daily for 5 days. 5 tablet Janmarie Smoot C, PA-C   fluconazole (DIFLUCAN) 150 MG tablet Take 1 tablet (150 mg total) by mouth once for 1 dose. 2 tablet Magon Croson C, PA-C   Cetirizine HCl 10 MG CAPS Take 1 capsule (10 mg total) by mouth daily. 15 capsule Quita Mcgrory C, PA-C   fluticasone (FLONASE) 50 MCG/ACT nasal spray Place 1-2 sprays into both nostrils daily for 7 days. 1 g Traniyah Hallett C, PA-C   lidocaine (XYLOCAINE) 2 % solution Use as directed 10 mLs in the mouth or throat every 6 (six) hours  as needed for mouth pain. 200 mL Gentri Guardado, [place] C, PA-C     PDMP not reviewed this encounter.   Janith Lima, [place] 11/03/18 [phone]

## 2018-11-03 NOTE — Telephone Encounter (Signed)
Chlamydia is positive.  This was treated at the urgent care visit with po zithromax 1g.  Pt needs education to please refrain from sexual intercourse for 7 days to give the medicine time to work.  Sexual partners need to be notified and tested/treated.  Condoms may reduce risk of reinfection.  Recheck or followup with PCP for further evaluation if symptoms are not improving.  [facility] notified.  Patient contacted and made aware of    results, all questions answered   

## 2018-11-03 NOTE — ED Triage Notes (Signed)
Sore throat for 2 weeks, pain worse since Friday.  Patient seen 11/01/2018.  Patient feels like symptoms worsening.    Patient has mentioned having had "unprotected oral sex"  Medication is not helping, getting worse

## 2018-11-04 ENCOUNTER — Telehealth (HOSPITAL_COMMUNITY): Payer: Self-pay | Admitting: Emergency Medicine

## 2018-11-04 LAB — CYTOLOGY, (ORAL, ANAL, URETHRAL) ANCILLARY ONLY
Chlamydia: NEGATIVE
Neisseria Gonorrhea: NEGATIVE

## 2018-11-04 LAB — CULTURE, GROUP A STREP (THRC)

## 2018-11-04 NOTE — Telephone Encounter (Signed)
Patient contacted and made aware of   cytology results, all questions answered  

## 2018-11-04 NOTE — Telephone Encounter (Signed)
Strep treated, Attempted to reach patient. No answer at this time. Voicemail left.

## 2018-12-17 ENCOUNTER — Other Ambulatory Visit: Payer: Self-pay

## 2018-12-17 DIAGNOSIS — Z20822 Contact with and (suspected) exposure to covid-19: Secondary | ICD-10-CM

## 2018-12-18 LAB — NOVEL CORONAVIRUS, NAA: SARS-CoV-2, NAA: NOT DETECTED

## 2019-04-11 ENCOUNTER — Ambulatory Visit (HOSPITAL_COMMUNITY)
Admission: EM | Admit: 2019-04-11 | Discharge: 2019-04-11 | Disposition: A | Discharge status: Home / Self Care | Payer: 59 | Attending: Family Medicine | Admitting: Family Medicine

## 2019-04-11 ENCOUNTER — Other Ambulatory Visit: Payer: Self-pay

## 2019-04-11 ENCOUNTER — Encounter (HOSPITAL_COMMUNITY): Payer: Self-pay

## 2019-04-11 DIAGNOSIS — N390 Urinary tract infection, site not specified: Secondary | ICD-10-CM

## 2019-04-11 DIAGNOSIS — Z3202 Encounter for pregnancy test, result negative: Secondary | ICD-10-CM

## 2019-04-11 LAB — POCT URINALYSIS DIP (DEVICE)
Bilirubin Urine: NEGATIVE
Glucose, UA: NEGATIVE mg/dL
Ketones, ur: NEGATIVE mg/dL
Nitrite: NEGATIVE
Protein, ur: NEGATIVE mg/dL
Specific Gravity, Urine: 1.02 (ref 1.005–1.030)
Urobilinogen, UA: 0.2 mg/dL (ref 0.0–1.0)
pH: 6 (ref 5.0–8.0)

## 2019-04-11 LAB — POCT PREGNANCY, URINE: Preg Test, Ur: NEGATIVE

## 2019-04-11 LAB — POC URINE PREG, ED: Preg Test, Ur: NEGATIVE

## 2019-04-11 MED ORDER — NITROFURANTOIN MONOHYD MACRO 100 MG PO CAPS: 100.0000 mg | ORAL_CAPSULE | Freq: Two times a day (BID) | ORAL | 0 refills | Status: AC

## 2019-04-11 NOTE — Discharge Instructions (Signed)
Drink plenty of water to empty bladder regularly. Avoid alcohol and caffeine as these may irritate the bladder.   Complete course of antibiotics.

## 2019-04-11 NOTE — ED Provider Notes (Signed)
[place]    CSN: [id number] Arrival date & time: 04/11/19  1118      History   Chief Complaint Chief Complaint  Patient presents with  . Urinary Tract Infection    HPI Brooke Lucas is a 34 y.o. female.   Brooke Lucas presents with complaints of odor to urine and urinary frequency which has been ongoing for the past 4 days. Abdominal cramping. No specific vaginal discharge but she feels like she could possibly have a yeast infection. States she tends to get either a uti, bv or yeast following her period, which she just ended. No fevers. No back pain. No pain with urination. She is sexually active , no specific known std exposure but would like screening.     ROS per HPI, negative if not otherwise mentioned.      Past Medical History:  Diagnosis Date  . Anxiety   . Depression    after loss of son  . Sickle cell trait ([place])   . Trichomonas     Patient Active Problem List   Diagnosis Date Noted  . History of bilateral tubal ligation 07/24/2016  . Tobacco abuse 07/24/2016  . Unwanted fertility 04/24/2016    Past Surgical History:  Procedure Laterality Date  . extraction wisdom    . TUBAL LIGATION N/A 05/22/2016   Procedure: POST PARTUM TUBAL LIGATION;  Surgeon: Chancy Milroy, MD;  Location: [place];  Service: Gynecology;  Laterality: N/A;    OB History    Gravida  3   Para  3   Term  3   Preterm  0   AB  0   Living  2     SAB  0   TAB  0   Ectopic  0   Multiple  0   Live Births  2            Home Medications    Prior to Admission medications   Medication Sig Start Date End Date Taking? Authorizing Provider  acetaminophen (TYLENOL) 325 MG tablet Take 650 mg by mouth every 6 (six) hours as needed.    [provider]  Cetirizine HCl 10 MG CAPS Take 1 capsule (10 mg total) by mouth daily. 11/03/18   Wieters, Hallie C, PA-C  fluticasone (FLONASE) 50 MCG/ACT nasal spray Place 1-2 sprays into both  nostrils daily for 7 days. 11/03/18 11/10/18  Wieters, Hallie C, PA-C  ibuprofen (ADVIL,MOTRIN) 600 MG tablet Take 1 tablet (600 mg total) by mouth every 6 (six) hours. 05/23/16   Emily Filbert, MD  lidocaine (XYLOCAINE) 2 % solution Use as directed 10 mLs in the mouth or throat every 6 (six) hours as needed for mouth pain. 11/03/18   Wieters, Hallie C, PA-C  nitrofurantoin, macrocrystal-monohydrate, (MACROBID) 100 MG capsule Take 1 capsule (100 mg total) by mouth 2 (two) times daily for 5 days. 04/11/19 04/16/19  Zigmund Gottron, NP    Family History Family History  Problem Relation Age of Onset  . Cancer Brother   . Birth defects Son        heart  . Heart disease Maternal Grandmother   . Arthritis Paternal Grandmother   . Other Neg Hx     Social History Social History   Tobacco Use  . Smoking status: Current Every Day Smoker    Packs/day: 0.25    Years: 0.00    Pack years: 0.00    Types: Cigarettes  . Smokeless tobacco: Never  Used  . Tobacco comment: 1/2 pack per day  Substance Use Topics  . Alcohol use: No    Alcohol/week: 0.0 standard drinks    Comment: Socially  . Drug use: Yes    Types: Marijuana    Comment: PT DENIES BUT +UDS 05/22/16     Allergies   Amoxicillin, Penicillins, and Benadryl [diphenhydramine]   Review of Systems Review of Systems   Physical Exam Triage Vital Signs ED Triage Vitals  Enc Vitals Group     BP 04/11/19 1149 123/77     Pulse Rate 04/11/19 1149 73     Resp 04/11/19 1149 18     Temp 04/11/19 1149 99.1 F (37.3 C)     Temp Source 04/11/19 1149 Oral     SpO2 04/11/19 1149 98 %     Weight --      Height --      Head Circumference --      Peak Flow --      Pain Score 04/11/19 1150 5     Pain Loc --      Pain Edu? --      Excl. in GC? --    No data found.  Updated Vital Signs BP 123/77 (BP Location: Right Arm)   Pulse 73   Temp 99.1 F (37.3 C) (Oral)   Resp 18   LMP 04/02/2019   SpO2 98%    Physical Exam Constitutional:        General: She is not in acute distress.    Appearance: She is well-developed.  Cardiovascular:     Rate and Rhythm: Normal rate.  Pulmonary:     Effort: Pulmonary effort is normal.  Abdominal:     Palpations: Abdomen is not rigid.     Tenderness: There is no abdominal tenderness. There is no guarding or rebound.  Genitourinary:    Comments: Denies sores, lesions, vaginal bleeding; no pelvic pain; gu exam deferred at this time, vaginal self swab collected.   Skin:    General: Skin is warm and dry.  Neurological:     Mental Status: She is alert and oriented to person, place, and time.      UC Treatments / Results  Labs (all labs ordered are listed, but only abnormal results are displayed) Labs Reviewed  POCT URINALYSIS DIP (DEVICE) - Abnormal; Notable for the following components:      Result Value   Hgb urine dipstick SMALL (*)    Leukocytes,Ua SMALL (*)    All other components within normal limits  URINE CULTURE  POC URINE PREG, ED  POCT PREGNANCY, URINE  CERVICOVAGINAL ANCILLARY ONLY    EKG   Radiology No results found.  Procedures Procedures (including critical care time)  Medications Ordered in UC Medications - No data to display  Initial Impression / Assessment and Plan / UC Course  I have reviewed the triage vital signs and the nursing notes.  Pertinent labs & imaging results that were available during my care of the patient were reviewed by me and considered in my medical decision making (see chart for details).    Small leuks, small hgb to urine with urinary frequency as well as some odor to urine. Urine culture obtained and macrobid initiated. Vaginal cytology also collected and pending. Return precautions provided. Patient verbalized understanding and agreeable to plan.   Final Clinical Impressions(s) / UC Diagnoses   Final diagnoses:  Lower urinary tract infectious disease     Discharge Instructions  Drink plenty of water to empty  bladder regularly. Avoid alcohol and caffeine as these may irritate the bladder.   Complete course of antibiotics.     ED Prescriptions    Medication Sig Dispense Auth. Provider   nitrofurantoin, macrocrystal-monohydrate, (MACROBID) 100 MG capsule Take 1 capsule (100 mg total) by mouth 2 (two) times daily for 5 days. 10 capsule Georgetta Haber, NP     PDMP not reviewed this encounter.   Georgetta Haber, NP 04/11/19 1650

## 2019-04-11 NOTE — ED Triage Notes (Signed)
Pt presents with generalized abdominal cramping and discoloration of urine X 2 days.

## 2019-04-13 LAB — URINE CULTURE: Culture: 70000 — AB

## 2019-04-13 LAB — CERVICOVAGINAL ANCILLARY ONLY
Bacterial vaginitis: POSITIVE — AB
Candida vaginitis: POSITIVE — AB
Chlamydia: NEGATIVE
Neisseria Gonorrhea: NEGATIVE
Trichomonas: NEGATIVE

## 2019-04-15 ENCOUNTER — Telehealth (HOSPITAL_COMMUNITY): Payer: Self-pay | Admitting: Emergency Medicine

## 2019-04-15 MED ORDER — METRONIDAZOLE 500 MG PO TABS: 500.0000 mg | ORAL_TABLET | Freq: Two times a day (BID) | ORAL | 0 refills | Status: AC

## 2019-04-15 MED ORDER — FLUCONAZOLE 150 MG PO TABS: 150.0000 mg | ORAL_TABLET | Freq: Once | ORAL | 0 refills | Status: DC

## 2019-04-15 NOTE — Telephone Encounter (Signed)
Urine culture was treated with macrobid. Please fully complete the course of antibiotics until there are no pills left. Follow up with PCP or return for recheck if symptoms persist.  Bacterial vaginosis is positive. Pt needs treatment. Flagyl 500 mg BID x 7 days #14 no refills sent to patients pharmacy of choice.    Test for candida (yeast) was positive.  Prescription for fluconazole 150mg  po now, repeat dose in 3d if needed, #2 no refills, sent to the pharmacy of record.  Recheck or followup with PCP for further evaluation if symptoms are not improving.    Patient contacted by phone and made aware of    results. Pt verbalized understanding and had all questions answered.

## 2019-06-16 ENCOUNTER — Ambulatory Visit: Payer: Self-pay | Admitting: Obstetrics

## 2019-08-05 ENCOUNTER — Ambulatory Visit (HOSPITAL_COMMUNITY)
Admission: EM | Admit: 2019-08-05 | Discharge: 2019-08-05 | Disposition: A | Discharge status: Home / Self Care | Payer: Medicaid Other

## 2019-08-05 ENCOUNTER — Other Ambulatory Visit: Payer: Self-pay

## 2019-08-05 ENCOUNTER — Encounter (HOSPITAL_COMMUNITY): Payer: Self-pay

## 2019-08-05 DIAGNOSIS — N760 Acute vaginitis: Secondary | ICD-10-CM

## 2019-08-05 DIAGNOSIS — B9689 Other specified bacterial agents as the cause of diseases classified elsewhere: Secondary | ICD-10-CM

## 2019-08-05 DIAGNOSIS — B379 Candidiasis, unspecified: Secondary | ICD-10-CM

## 2019-08-05 DIAGNOSIS — J340 Abscess, furuncle and carbuncle of nose: Secondary | ICD-10-CM

## 2019-08-05 MED ORDER — FLUCONAZOLE 150 MG PO TABS: 150.0000 mg | ORAL_TABLET | Freq: Once | ORAL | 0 refills | Status: DC

## 2019-08-05 MED ORDER — MUPIROCIN 2 % EX OINT: 1.0000 "application " | TOPICAL_OINTMENT | Freq: Two times a day (BID) | CUTANEOUS | 0 refills | Status: DC

## 2019-08-05 MED ORDER — METRONIDAZOLE 500 MG PO TABS: 500.0000 mg | ORAL_TABLET | Freq: Two times a day (BID) | ORAL | 0 refills | Status: AC

## 2019-08-05 MED ORDER — DOXYCYCLINE HYCLATE 100 MG PO CAPS: 100.0000 mg | ORAL_CAPSULE | Freq: Two times a day (BID) | ORAL | 0 refills | Status: AC

## 2019-08-05 NOTE — Discharge Instructions (Signed)
Begin doxycycline twice daily for the next week to treat infection Bactroban twice daily to inside of nose with a Q-tip  Tylenol and ibuprofen for pain Warm compresses  I have refilled Diflucan and metronidazole for you to take for yeast/BV.  Discussed strict return precautions. Patient verbalized understanding and is agreeable with plan.

## 2019-08-05 NOTE — ED Provider Notes (Signed)
[facility]    CSN: [id number] Arrival date & time: 08/05/19  1150      History   Chief Complaint Chief Complaint  Patient presents with  . Abscess    HPI Brooke Lucas is a 34 y.o. female history of tubal ligation presenting today for evaluation of nasal abscess.  Patient reports over the past 4-5 days she has had increased pain and swelling to her nose and of recently started developing a bump on her left nostril.  She feels some slight discomfort extending into her left maxillary area.  Denies fevers.  Denies difficulty swallowing.  She also reports recurrent yeast/BV after her menstrual cycle and symptoms are flared up at this time.  Has not been able to get refills from her typical OB at this time as she is currently switching insurances.  HPI  Past Medical History:  Diagnosis Date  . Anxiety   . Depression    after loss of son  . Sickle cell trait (HCC)   . Trichomonas     Patient Active Problem List   Diagnosis Date Noted  . History of bilateral tubal ligation 07/24/2016  . Tobacco abuse 07/24/2016  . Unwanted fertility 04/24/2016    Past Surgical History:  Procedure Laterality Date  . extraction wisdom    . TUBAL LIGATION N/A 05/22/2016   Procedure: POST PARTUM TUBAL LIGATION;  Surgeon: Hermina Staggers, MD;  Location: [facility]IRTH[facility];  Service: Gynecology;  Laterality: N/A;    OB History    Gravida  3   Para  3   Term  3   Preterm  0   AB  0   Living  2     SAB  0   TAB  0   Ectopic  0   Multiple  0   Live Births  2            Home Medications    Prior to Admission medications   Medication Sig Start Date End Date Taking? Authorizing Provider  Prenat-FeCbn-FeAspGl-FA-Omega (OB COMPLETE PETITE) 35-5-1-200 MG CAPS Take by mouth. 12/18/15  Yes [provider]  acetaminophen (TYLENOL) 325 MG tablet Take 650 mg by mouth every 6 (six) hours as needed.    [provider]  doxycycline (VIBRAMYCIN) 100 MG  capsule Take 1 capsule (100 mg total) by mouth 2 (two) times daily for 10 days. 08/05/19 08/15/19  Sherylann Vangorden C, PA-C  fluconazole (DIFLUCAN) 150 MG tablet Take 1 tablet (150 mg total) by mouth once for 1 dose. 08/05/19 08/05/19  Tannen Vandezande C, PA-C  fluticasone (FLONASE) 50 MCG/ACT nasal spray Place 1-2 sprays into both nostrils daily for 7 days. 11/03/18 11/10/18  Mali Eppard C, PA-C  metroNIDAZOLE (FLAGYL) 500 MG tablet Take 1 tablet (500 mg total) by mouth 2 (two) times daily for 7 days. 08/05/19 08/12/19  Krystall Kruckenberg C, PA-C  mupirocin ointment (BACTROBAN) 2 % Apply 1 application topically 2 (two) times daily. To nose 08/05/19   Ziah Turvey C, PA-C  Cetirizine HCl 10 MG CAPS Take 1 capsule (10 mg total) by mouth daily. 11/03/18 08/05/19  Rishab Stoudt, Junius Creamer, PA-C    Family History Family History  Problem Relation Age of Onset  . Cancer Brother   . Birth defects Son        heart  . Heart disease Maternal Grandmother   . Arthritis Paternal Grandmother   . Hypertension Mother   . Other Father  Gunshot    Social History Social History   Tobacco Use  . Smoking status: Former Smoker    Packs/day: 0.25    Years: 0.00    Pack years: 0.00    Types: Cigarettes    Quit date: 07/15/2019    Years since quitting: 0.0  . Smokeless tobacco: Never Used  . Tobacco comment: Pt recently stopped cold [place] 07/15/2019  Vaping Use  . Vaping Use: Never used  Substance Use Topics  . Alcohol use: Yes    Alcohol/week: 0.0 standard drinks    Comment: Socially  . Drug use: Yes    Types: Marijuana    Comment: PT IS +UDS 05/22/16     Allergies   Amoxicillin, Penicillins, and Benadryl [diphenhydramine]   Review of Systems Review of Systems  Constitutional: Negative for fatigue and fever.  HENT: Positive for facial swelling. Negative for mouth sores.   Eyes: Negative for visual disturbance.  Respiratory: Negative for shortness of breath.   Cardiovascular: Negative for chest pain.    Gastrointestinal: Negative for abdominal pain, nausea and vomiting.  Musculoskeletal: Negative for arthralgias and joint swelling.  Skin: Positive for color change. Negative for rash and wound.  Neurological: Negative for dizziness, weakness, light-headedness and headaches.     Physical Exam Triage Vital Signs ED Triage Vitals  Enc Vitals Group     BP 08/05/19 1207 (!) 104/56     Pulse Rate 08/05/19 1207 95     Resp 08/05/19 1207 17     Temp 08/05/19 1207 98.4 F (36.9 C)     Temp Source 08/05/19 1207 Oral     SpO2 08/05/19 1207 100 %     Weight --      Height --      Head Circumference --      Peak Flow --      Pain Score 08/05/19 1201 0     Pain Loc --      Pain Edu? --      Excl. in GC? --    No data found.  Updated Vital Signs BP (!) 104/56 (BP Location: Right Arm)   Pulse 95   Temp 98.4 F (36.9 C) (Oral)   Resp 17   LMP 07/13/2019   SpO2 100%   Visual Acuity Right Eye Distance:   Left Eye Distance:   Bilateral Distance:    Right Eye Near:   Left Eye Near:    Bilateral Near:     Physical Exam Vitals and nursing note reviewed.  Constitutional:      Appearance: She is well-developed.     Comments: No acute distress  HENT:     Head: Normocephalic and atraumatic.     Nose: Nose normal.     Comments: Bilateral nares patent, no obvious swelling or abscess in nares, superior aspect of left nostril with mild erythema and induration, does not extend down to maxillary area    Mouth/Throat:     Comments: Oral mucosa pink and moist, no tonsillar enlargement or exudate. Posterior pharynx patent and nonerythematous, no uvula deviation or swelling. Normal phonation. Eyes:     Conjunctiva/sclera: Conjunctivae normal.  Neck:     Comments: Full active range of motion of neck Cardiovascular:     Rate and Rhythm: Normal rate.  Pulmonary:     Effort: Pulmonary effort is normal. No respiratory distress.  Abdominal:     General: There is no distension.   Musculoskeletal:        General: Normal range  of motion.     Cervical back: Neck supple.  Skin:    General: Skin is warm and dry.  Neurological:     Mental Status: She is alert and oriented to person, place, and time.      UC Treatments / Results  Labs (all labs ordered are listed, but only abnormal results are displayed) Labs Reviewed - No data to display  EKG   Radiology No results found.  Procedures Procedures (including critical care time)  Medications Ordered in UC Medications - No data to display  Initial Impression / Assessment and Plan / UC Course  I have reviewed the triage vital signs and the nursing notes.  Pertinent labs & imaging results that were available during my care of the patient were reviewed by me and considered in my medical decision making (see chart for details).     Patient does appear to have cellulitis/early abscess to left nares.  Initiating on doxycycline, Bactroban topically internally, continue warm compresses and monitor for gradual improvement.  No airway compromise at this time.  Refilling Diflucan and metronidazole for yeast and BV.  Discussed strict return precautions. Patient verbalized understanding and is agreeable with plan.  Final Clinical Impressions(s) / UC Diagnoses   Final diagnoses:  Nasal abscess     Discharge Instructions     Begin doxycycline twice daily for the next week to treat infection Bactroban twice daily to inside of nose with a Q-tip  Tylenol and ibuprofen for pain Warm compresses  I have refilled Diflucan and metronidazole for you to take for yeast/BV.  Discussed strict return precautions. Patient verbalized understanding and is agreeable with plan.     ED Prescriptions    Medication Sig Dispense Auth. Provider   doxycycline (VIBRAMYCIN) 100 MG capsule Take 1 capsule (100 mg total) by mouth 2 (two) times daily for 10 days. 20 capsule Kiaya Haliburton C, PA-C   fluconazole (DIFLUCAN) 150 MG  tablet Take 1 tablet (150 mg total) by mouth once for 1 dose. 2 tablet Anias Bartol C, PA-C   metroNIDAZOLE (FLAGYL) 500 MG tablet Take 1 tablet (500 mg total) by mouth 2 (two) times daily for 7 days. 14 tablet Latorsha Curling C, PA-C   mupirocin ointment (BACTROBAN) 2 % Apply 1 application topically 2 (two) times daily. To nose 30 g Mykeria Garman, [place] C, PA-C     PDMP not reviewed this encounter.   Janith Lima, [place] 08/05/19 1237

## 2019-08-05 NOTE — ED Triage Notes (Signed)
Pt is here with a facial bump on her nose that started 2 days ago, at first the bump was inside her nose now its on the outside of her nose. Pt has used warm compresses. Pt states she has a hx of this too.

## 2019-10-11 ENCOUNTER — Ambulatory Visit (HOSPITAL_COMMUNITY)
Admission: EM | Admit: 2019-10-11 | Discharge: 2019-10-11 | Disposition: A | Discharge status: Home / Self Care | Payer: Medicaid Other | Attending: Family Medicine | Admitting: Family Medicine

## 2019-10-11 ENCOUNTER — Encounter (HOSPITAL_COMMUNITY): Payer: Self-pay | Admitting: Emergency Medicine

## 2019-10-11 ENCOUNTER — Other Ambulatory Visit: Payer: Self-pay

## 2019-10-11 DIAGNOSIS — Z20822 Contact with and (suspected) exposure to covid-19: Secondary | ICD-10-CM

## 2019-10-11 NOTE — Discharge Instructions (Signed)

## 2019-10-11 NOTE — ED Triage Notes (Signed)
Loss of sense of smell and taste, minor headache-symptoms started yesterday.   Patient wants testing for covid, declines seeing provider

## 2019-10-12 LAB — SARS CORONAVIRUS 2 (TAT 6-24 HRS): SARS Coronavirus 2: POSITIVE — AB

## 2019-10-13 ENCOUNTER — Telehealth: Payer: Self-pay | Admitting: Adult Health

## 2019-10-13 ENCOUNTER — Encounter: Payer: Self-pay | Admitting: Adult Health

## 2019-10-13 NOTE — Telephone Encounter (Signed)
Called and unable to [facility] regarding monoclonal antibody treatment for COVID 19 given to those who are at risk for complications and/or hospitalization of the virus.  Patient meets criteria based on: tobacco use  Call back number given: [phone]  My chart message: sent  Lillard Anes, NP

## 2019-10-26 ENCOUNTER — Ambulatory Visit: Payer: Medicaid Other | Admitting: Obstetrics

## 2019-11-07 ENCOUNTER — Ambulatory Visit: Payer: Medicaid Other | Admitting: Obstetrics

## 2019-12-01 ENCOUNTER — Telehealth: Payer: Self-pay

## 2019-12-01 ENCOUNTER — Other Ambulatory Visit: Payer: Self-pay | Admitting: Obstetrics

## 2019-12-01 ENCOUNTER — Other Ambulatory Visit (HOSPITAL_COMMUNITY)
Admission: RE | Admit: 2019-12-01 | Discharge: 2019-12-01 | Disposition: A | Discharge status: Home / Self Care | Payer: Medicaid Other | Source: Ambulatory Visit | Attending: Obstetrics | Admitting: Obstetrics

## 2019-12-01 ENCOUNTER — Ambulatory Visit (INDEPENDENT_AMBULATORY_CARE_PROVIDER_SITE_OTHER): Payer: Medicaid Other | Admitting: Obstetrics

## 2019-12-01 ENCOUNTER — Encounter: Payer: Self-pay | Admitting: Obstetrics

## 2019-12-01 ENCOUNTER — Other Ambulatory Visit: Payer: Self-pay

## 2019-12-01 VITALS — BP 107/67 | HR 69 | Wt 162.0 lb

## 2019-12-01 DIAGNOSIS — N898 Other specified noninflammatory disorders of vagina: Secondary | ICD-10-CM

## 2019-12-01 DIAGNOSIS — Z3202 Encounter for pregnancy test, result negative: Secondary | ICD-10-CM

## 2019-12-01 DIAGNOSIS — Z01419 Encounter for gynecological examination (general) (routine) without abnormal findings: Secondary | ICD-10-CM | POA: Diagnosis present

## 2019-12-01 DIAGNOSIS — Z113 Encounter for screening for infections with a predominantly sexual mode of transmission: Secondary | ICD-10-CM

## 2019-12-01 DIAGNOSIS — N760 Acute vaginitis: Secondary | ICD-10-CM

## 2019-12-01 DIAGNOSIS — B9689 Other specified bacterial agents as the cause of diseases classified elsewhere: Secondary | ICD-10-CM

## 2019-12-01 DIAGNOSIS — N644 Mastodynia: Secondary | ICD-10-CM

## 2019-12-01 DIAGNOSIS — N9089 Other specified noninflammatory disorders of vulva and perineum: Secondary | ICD-10-CM

## 2019-12-01 NOTE — Progress Notes (Signed)
Subjective:        Brooke Lucas is a 34 y.o. female here for a routine exam.  Current complaints: Floppy skin of left labia after laceration from childbirth, that uncomfortable during intercourse.  Also complains of right breast pain, but no lumps felt.  Also concerned about recurrent BV after her period each month, with malodorous vaginal discharge.  Personal health questionnaire:  Is patient Ashkenazi Jewish, have a family history of breast and/or ovarian cancer: no Is there a family history of uterine cancer diagnosed at age < 76, gastrointestinal cancer, urinary tract cancer, family member who is a Personnel officer syndrome-associated carrier: no Is the patient overweight and hypertensive, family history of diabetes, personal history of gestational diabetes, preeclampsia or PCOS: no Is patient over 73, have PCOS,  family history of premature CHD under age 64, diabetes, smoke, have hypertension or peripheral artery disease:  no At any time, has a partner hit, kicked or otherwise hurt or frightened you?: no Over the past 2 weeks, have you felt down, depressed or hopeless?: no Over the past 2 weeks, have you felt little interest or pleasure in doing things?:no   Gynecologic History No LMP recorded. Contraception: tubal ligation Last Pap: 2018. Results were: normal Last mammogram: n/a. 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 Anxiety   . Depression    after loss of son  . Sickle cell trait (HCC)   . Trichomonas      Past Surgical History:  Procedure Laterality Date  . extraction wisdom    . TUBAL LIGATION N/A 05/22/2016   Procedure: POST PARTUM TUBAL LIGATION;  Surgeon: 07/22/2016, MD;  Location: [facility];  Service: Gynecology;  Laterality: N/A;     Current Outpatient Medications:  .  Ascorbic Acid (VITAMIN C) 1000 MG tablet, Take 1,000 mg by mouth daily., Disp: , Rfl:  .  acetaminophen (TYLENOL) 325 MG tablet, Take 650 mg by mouth every 6 (six) hours as needed., Disp: , Rfl:  .  fluticasone (FLONASE) 50 MCG/ACT nasal spray, Place 1-2 sprays into both nostrils daily for 7 days., Disp: 1 g, Rfl: 0 .  mupirocin ointment (BACTROBAN) 2 %, Apply 1 application topically 2 (two) times daily. To nose, Disp: 30 g, Rfl: 0 .  Prenat-FeCbn-FeAspGl-FA-Omega (OB COMPLETE PETITE) 35-5-1-200 MG CAPS, Take by mouth., Disp: , Rfl:  Allergies  Allergen Reactions  . Amoxicillin Anaphylaxis    Has patient had a PCN reaction causing immediate rash, facial/tongue/throat swelling, SOB or lightheadedness with hypotension: Yes Has patient had a PCN reaction causing severe rash involving mucus membranes or skin necrosis: Yes Has patient had a PCN reaction that required hospitalization No Has patient had a PCN reaction occurring within the last 10 years: No If all of the above answers are "NO", then may proceed with Cephalosporin use.  11-06-2003  Penicillins Anaphylaxis    Has patient had a PCN reaction causing immediate rash, facial/tongue/throat swelling, SOB or lightheadedness with hypotension: Yes Has patient had a PCN reaction causing severe rash involving mucus membranes or skin necrosis: Yes Has patient had a PCN reaction that required hospitalization No Has patient had a PCN reaction occurring within the last 10 years: No If all of the above answers are "NO", then may proceed with Cephalosporin use.  . Benadryl [Diphenhydramine] Anxiety    Panic, feels like she cant breathing    Social History   Tobacco Use   . Smoking status: Former Smoker    Packs/day: 0.25    Years: 0.00    Pack years: 0.00    Types: Cigarettes    Quit date: 07/15/2019    Years since quitting: 0.3  . Smokeless tobacco: Never Used  . Tobacco comment: Pt recently stopped cold [place] 07/15/2019  Substance Use Topics  . Alcohol use: Yes    Alcohol/week: 0.0 standard drinks    Comment: Socially    Family History  Problem Relation Age of Onset  . Cancer Brother   . Birth defects Son        heart  . Heart disease Maternal Grandmother   . Arthritis Paternal Grandmother   . Hypertension Mother   . Other Father        Gunshot      Review of Systems  Constitutional: negative for fatigue and weight loss Respiratory: negative for cough and wheezing Cardiovascular: negative for chest pain, fatigue and palpitations Gastrointestinal: negative for abdominal pain and change in bowel habits Musculoskeletal:negative for myalgias Neurological: negative for gait problems and tremors Behavioral/Psych: negative for abusive relationship, depression Endocrine: negative for temperature intolerance    Genitourinary:negative for abnormal menstrual periods, genital lesions, hot flashes, sexual problems. Positive for  vaginal discharge Integument/breast: negative for breast lump, nipple discharge and skin lesion(s).  Positive for breast tenderness, right    Objective:       BP 107/67   Pulse 69   Wt 162 lb (73.5 kg)   BMI 25.37 kg/m  General:   alert and no distress  Skin:   no rash or abnormalities  Lungs:   clear to auscultation bilaterally  Heart:   regular rate and rhythm, S1, S2 normal, no murmur, click, rub or gallop  Breasts:   normal without suspicious masses, skin or nipple changes or axillary nodes  Abdomen:  normal findings: no organomegaly, soft, non-tender and no hernia  Pelvis:  External genitalia: left labial skin tags Urinary system: urethral meatus normal and bladder without fullness, nontender Vaginal: normal  without tenderness, induration or masses Cervix: normal appearance Adnexa: normal bimanual exam Uterus: anteverted and non-tender, normal size   Lab Review Urine pregnancy test Labs reviewed yes Radiologic studies reviewed no  50% of 20 min visit spent on counseling and coordination of care.   Assessment:     1. Encounter for routine gynecological examination with Papanicolaou smear of cervix Rx: - Cytology - PAP( [place])  2. Labial skin tag - patient requesting surgical removal  3. Vaginal discharge Rx: - Cervicovaginal ancillary only( [place])  4. Bacterial vaginosis, chronic - Boric Acid Rx for month treatment x 6  5. Screen for STD (sexually transmitted disease) Rx: - HIV Antibody (routine testing w rflx) - Hepatitis B surface antigen - RPR - Hepatitis C antibody  6. Breast pain, right Rx: - MM DIAG BREAST TOMO BILATERAL; Future - MM Digital Diagnostic Unilat R;  Future     Plan:    Education reviewed: calcium supplements, depression evaluation, low fat, low cholesterol diet, safe sex/STD prevention, self breast exams and weight bearing exercise. Follow up in: 2 weeks.   No orders of the defined types were placed in this encounter.  Orders Placed This Encounter  Procedures  . MM DIAG BREAST TOMO BILATERAL    Standing Status:   Future    Standing Expiration Date:   11/30/2020    Order Specific Question:   Reason for Exam (SYMPTOM  OR DIAGNOSIS REQUIRED)    Answer:   Breast pain    Order Specific Question:   Is the patient pregnant?    Answer:   No    Order Specific Question:   Preferred imaging location?    Answer:   [facility]  . MM Digital Diagnostic Unilat R    Standing Status:   Future    Standing Expiration Date:   11/30/2020    Order Specific Question:   Reason for Exam (SYMPTOM  OR DIAGNOSIS REQUIRED)    Answer:   Breast pain    Order Specific Question:   Is the patient pregnant?    Answer:   No    Order Specific Question:    Preferred imaging location?    Answer:   [facility]  . HIV Antibody (routine testing w rflx)  . Hepatitis B surface antigen  . RPR  . Hepatitis C antibody    Brock Bad, MD 12/01/2019 10:07 AM

## 2019-12-01 NOTE — Telephone Encounter (Signed)
Telephoned patient at home number. Patient stated she will have full [place] Medicaid on 12/02/2019. She informed the referral office and the office will follow up with mammogram.

## 2019-12-01 NOTE — Progress Notes (Signed)
Pt thinks she may have bv, yeast and possible uti. Pt has been having some right sided breast pain.   Pt stopped smoking in June this year.

## 2019-12-02 ENCOUNTER — Other Ambulatory Visit: Payer: Self-pay | Admitting: Obstetrics

## 2019-12-02 DIAGNOSIS — B9689 Other specified bacterial agents as the cause of diseases classified elsewhere: Secondary | ICD-10-CM

## 2019-12-02 LAB — CERVICOVAGINAL ANCILLARY ONLY
Bacterial Vaginitis (gardnerella): POSITIVE — AB
Candida Glabrata: NEGATIVE
Candida Vaginitis: NEGATIVE
Chlamydia: NEGATIVE
Comment: NEGATIVE
Comment: NEGATIVE
Comment: NEGATIVE
Comment: NEGATIVE
Comment: NEGATIVE
Comment: NORMAL
Neisseria Gonorrhea: NEGATIVE
Trichomonas: NEGATIVE

## 2019-12-02 LAB — RPR: RPR Ser Ql: NONREACTIVE

## 2019-12-02 LAB — HEPATITIS B SURFACE ANTIGEN: Hepatitis B Surface Ag: NEGATIVE

## 2019-12-02 LAB — HEPATITIS C ANTIBODY: Hep C Virus Ab: <0.1 s/co ratio (ref 0.0–0.9)

## 2019-12-02 LAB — HIV ANTIBODY (ROUTINE TESTING W REFLEX): HIV Screen 4th Generation wRfx: NONREACTIVE

## 2019-12-02 MED ORDER — METRONIDAZOLE 500 MG PO TABS: 500.0000 mg | ORAL_TABLET | Freq: Two times a day (BID) | ORAL | 2 refills | Status: DC

## 2019-12-05 ENCOUNTER — Telehealth: Payer: Self-pay | Admitting: *Deleted

## 2019-12-05 NOTE — Telephone Encounter (Signed)
Pt called office stating Boric Acid supp at specialty pharmacy too expensive. Would like to know if routine Tx was sent to her pharmacy. Made aware that Flagyl was sent to [facility].  Advised if she wants to use another pharmacy she will have to call and have Rx transferred.  Pt states understanding.

## 2019-12-06 LAB — CYTOLOGY - PAP
Comment: NEGATIVE
Comment: NEGATIVE
Diagnosis: UNDETERMINED — AB
HPV 16: NEGATIVE
HPV 18 / 45: NEGATIVE
High risk HPV: POSITIVE — AB

## 2019-12-07 ENCOUNTER — Other Ambulatory Visit: Payer: Self-pay | Admitting: Obstetrics

## 2019-12-08 ENCOUNTER — Other Ambulatory Visit: Payer: Self-pay

## 2019-12-08 MED ORDER — FLUCONAZOLE 150 MG PO TABS: 150.0000 mg | ORAL_TABLET | Freq: Once | ORAL | 3 refills | Status: DC

## 2019-12-08 NOTE — Progress Notes (Signed)
Pt requested rx for yeast infection. Rx diflucan sent to pt pharmacy per protocol, pt made aware.

## 2019-12-19 ENCOUNTER — Encounter: Payer: Self-pay | Admitting: Obstetrics and Gynecology

## 2019-12-19 ENCOUNTER — Ambulatory Visit (INDEPENDENT_AMBULATORY_CARE_PROVIDER_SITE_OTHER): Payer: Medicaid Other | Admitting: Obstetrics and Gynecology

## 2019-12-19 ENCOUNTER — Other Ambulatory Visit (HOSPITAL_COMMUNITY)
Admission: RE | Admit: 2019-12-19 | Discharge: 2019-12-19 | Disposition: A | Discharge status: Home / Self Care | Payer: Medicaid Other | Source: Ambulatory Visit | Attending: Obstetrics and Gynecology | Admitting: Obstetrics and Gynecology

## 2019-12-19 ENCOUNTER — Other Ambulatory Visit: Payer: Self-pay

## 2019-12-19 VITALS — BP 114/72 | HR 71 | Wt 161.8 lb

## 2019-12-19 DIAGNOSIS — R87610 Atypical squamous cells of undetermined significance on cytologic smear of cervix (ASC-US): Secondary | ICD-10-CM | POA: Diagnosis not present

## 2019-12-19 DIAGNOSIS — R87810 Cervical high risk human papillomavirus (HPV) DNA test positive: Secondary | ICD-10-CM | POA: Insufficient documentation

## 2019-12-19 LAB — POCT URINE PREGNANCY: Preg Test, Ur: NEGATIVE

## 2019-12-19 NOTE — Progress Notes (Signed)
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  Placed in lithotomy position. Cervix viewed with speculum and colposcope after application of acetic acid.   Colposcopy adequate?  yes Acetowhite lesions? 9 o'clock Punctation? no Mosaicism?  no Abnormal vasculature?  no Biopsies? Yes 9 o'clock ECC? yes  COMMENTS:  Patient was given post procedure instructions.  She will return in 2 weeks for results. Patient also wanted skin tag removal which is actually part of labia minora. Removal is not appropriate Discussed benefits of Gardasil vaccine. Patient will look at her records to determine if she has already received it  Catalina Antigua, MD

## 2019-12-19 NOTE — Progress Notes (Signed)
Pt presents for colposcopy bx and surgery consult for labial skin tag  ASCUS HR HPV on 12/01/19 UPT neg

## 2019-12-21 LAB — SURGICAL PATHOLOGY

## 2019-12-26 ENCOUNTER — Other Ambulatory Visit: Payer: Medicaid Other

## 2020-06-25 ENCOUNTER — Ambulatory Visit (HOSPITAL_COMMUNITY)
Admission: EM | Admit: 2020-06-25 | Discharge: 2020-06-25 | Disposition: A | Discharge status: Home / Self Care | Payer: Medicaid Other | Attending: Student | Admitting: Student

## 2020-06-25 ENCOUNTER — Encounter (HOSPITAL_COMMUNITY): Payer: Self-pay

## 2020-06-25 ENCOUNTER — Other Ambulatory Visit: Payer: Self-pay

## 2020-06-25 DIAGNOSIS — R1031 Right lower quadrant pain: Secondary | ICD-10-CM

## 2020-06-25 LAB — POCT URINALYSIS DIPSTICK, ED / UC
Bilirubin Urine: NEGATIVE
Glucose, UA: NEGATIVE mg/dL
Ketones, ur: NEGATIVE mg/dL
Nitrite: NEGATIVE
Protein, ur: 30 mg/dL — AB
Specific Gravity, Urine: 1.02 (ref 1.005–1.030)
Urobilinogen, UA: 0.2 mg/dL (ref 0.0–1.0)
pH: 7 (ref 5.0–8.0)

## 2020-06-25 NOTE — ED Provider Notes (Signed)
[facility]    CSN: [id number] Arrival date & time: 06/25/20  1201      History   Chief Complaint Chief Complaint  Patient presents with  . UTI    HPI Brooke Lucas is a 35 y.o. female presenting for right-sided back pain and right lower quadrant pain for 1 day.  Medical history trichomonas, sickle cell trait, history bilateral tubal ligation.Marland Kitchen Pt presents with right side abdominal pain and flank pain since waking up this morning.  States the pain is constant, worse with movement.  Denies trauma, overuse.  States her urine looks slightly dark, but denies other urinary symptoms.  Denies STI risk. Denies hematuria, dysuria, frequency, urgency,  n/v/d, fevers/chills, abdnormal vaginal discharge, vaginal rashes, vaginal lesions.   HPI  Past Medical History:  Diagnosis Date  . Anxiety   . Depression    after loss of son  . Sickle cell trait (HCC)   . Trichomonas     Patient Active Problem List   Diagnosis Date Noted  . ASCUS with positive high risk HPV cervical 12/19/2019  . History of bilateral tubal ligation 07/24/2016  . Tobacco abuse 07/24/2016  . Unwanted fertility 04/24/2016    Past Surgical History:  Procedure Laterality Date  . extraction wisdom    . TUBAL LIGATION N/A 05/22/2016   Procedure: POST PARTUM TUBAL LIGATION;  Surgeon: Hermina Staggers, MD;  Location: [facility];  Service: Gynecology;  Laterality: N/A;    OB History    Gravida  3   Para  3   Term  3   Preterm  0   AB  0   Living  2     SAB  0   IAB  0   Ectopic  0   Multiple  0   Live Births  2            Home Medications    Prior to Admission medications   Medication Sig Start Date End Date Taking? Authorizing Provider  fluticasone (FLONASE) 50 MCG/ACT nasal spray Place 1-2 sprays into both nostrils daily for 7 days. 11/03/18 11/10/18  Wieters, Hallie C, PA-C  Cetirizine HCl 10 MG CAPS Take 1 capsule (10 mg total) by mouth daily. 11/03/18 08/05/19   Wieters, Junius Creamer, PA-C    Family History Family History  Problem Relation Age of Onset  . Cancer Brother   . Birth defects Son        heart  . Heart disease Maternal Grandmother   . Arthritis Paternal Grandmother   . Hypertension Mother   . Other Father        Gunshot    Social History Social History   Tobacco Use  . Smoking status: Former Smoker    Packs/day: 0.25    Years: 0.00    Pack years: 0.00    Types: Cigarettes    Quit date: 07/15/2019    Years since quitting: 0.9  . Smokeless tobacco: Never Used  . Tobacco comment: Pt recently stopped cold [place] 07/15/2019  Vaping Use  . Vaping Use: Never used  Substance Use Topics  . Alcohol use: Yes    Alcohol/week: 0.0 standard drinks    Comment: Socially  . Drug use: Yes    Types: Marijuana    Comment: PT IS +UDS 05/22/16     Allergies   Amoxicillin, Penicillins, and Benadryl [diphenhydramine]   Review of Systems Review of Systems  Constitutional: Negative for appetite change, chills, diaphoresis and fever.  Respiratory:  Negative for shortness of breath.   Cardiovascular: Negative for chest pain.  Gastrointestinal: Positive for abdominal pain. Negative for blood in stool, constipation, diarrhea, nausea and vomiting.  Genitourinary: Positive for flank pain. Negative for decreased urine volume, difficulty urinating, dysuria, frequency, genital sores, hematuria, menstrual problem, pelvic pain, urgency, vaginal bleeding, vaginal discharge and vaginal pain.  Musculoskeletal: Negative for back pain.  Neurological: Negative for dizziness, weakness and light-headedness.  All other systems reviewed and are negative.    Physical Exam Triage Vital Signs ED Triage Vitals [06/25/20 1422]  Enc Vitals Group     BP 121/77     Pulse Rate 86     Resp 17     Temp 98.7 F (37.1 C)     Temp Source Oral     SpO2 98 %     Weight      Height      Head Circumference      Peak Flow      Pain Score 7     Pain Loc      Pain  Edu?      Excl. in GC?    No data found.  Updated Vital Signs BP 121/77 (BP Location: Left Arm)   Pulse 86   Temp 98.7 F (37.1 C) (Oral)   Resp 17   LMP 06/11/2020   SpO2 98%   Visual Acuity Right Eye Distance:   Left Eye Distance:   Bilateral Distance:    Right Eye Near:   Left Eye Near:    Bilateral Near:     Physical Exam Vitals reviewed.  Constitutional:      General: She is not in acute distress.    Appearance: Normal appearance. She is not ill-appearing.  HENT:     Head: Normocephalic and atraumatic.     Mouth/Throat:     Mouth: Mucous membranes are moist.     Comments: Moist mucous membranes Eyes:     Extraocular Movements: Extraocular movements intact.     Pupils: Pupils are equal, round, and reactive to light.  Cardiovascular:     Rate and Rhythm: Normal rate and regular rhythm.     Heart sounds: Normal heart sounds.  Pulmonary:     Effort: Pulmonary effort is normal.     Breath sounds: Normal breath sounds. No wheezing, rhonchi or rales.  Abdominal:     General: Bowel sounds are normal. There is no distension.     Palpations: Abdomen is soft. There is no mass.     Tenderness: There is abdominal tenderness. There is guarding and rebound. There is no right CVA tenderness or left CVA tenderness. Positive signs include McBurney's sign. Negative signs include Murphy's sign and Rovsing's sign.     Comments: Reproducible R side pain but no CVAT.  Skin:    General: Skin is warm.     Capillary Refill: Capillary refill takes less than 2 seconds.     Comments: Good skin turgor  Neurological:     General: No focal deficit present.     Mental Status: She is alert and oriented to person, place, and time.  Psychiatric:        Mood and Affect: Mood normal.        Behavior: Behavior normal.        Thought Content: Thought content normal.        Judgment: Judgment normal.      UC Treatments / Results  Labs (all labs ordered are listed, but only abnormal  results are displayed) Labs Reviewed  POCT URINALYSIS DIPSTICK, ED / UC - Abnormal; Notable for the following components:      Result Value   Hgb urine dipstick SMALL (*)    Protein, ur 30 (*)    Leukocytes,Ua SMALL (*)    All other components within normal limits  POCT URINALYSIS DIPSTICK, ED / UC    EKG   Radiology No results found.  Procedures Procedures (including critical care time)  Medications Ordered in UC Medications - No data to display  Initial Impression / Assessment and Plan / UC Course  I have reviewed the triage vital signs and the nursing notes.  Pertinent labs & imaging results that were available during my care of the patient were reviewed by me and considered in my medical decision making (see chart for details).     This patient is a 35 year old female presenting with right lower quadrant pain as well as some right side pain.  Some guarding and rebound.  No CVAT.  She is afebrile and nontachycardic.  UA with small blood, small leuk, protein. Will defer culture to ED provider.Tubal ligation fo contraception.  Differential is cystitis versus kidney stone versus obstructive kidney stone versus appendicitis.  Upon discussion, patient states that she does wish to head to the emergency room for evaluation.  She is hemodynamically stable for transport in personal vehicle at this time.  Final Clinical Impressions(s) / UC Diagnoses   Final diagnoses:  Right lower quadrant pain     Discharge Instructions     -Head to the emergency room for further evaluation and management of right lower quadrant pain.  Given the location and severity of this pain, I am concerned for appendicitis.  This does require ER evaluation for diagnosis and management, including abdominal imaging. -You can head to [facility] emergency room, or you can try our new [facility] facility, which has shorter wait times. Information below. -If your symptoms worsen while on the way, like worsening  abdominal pain, new dizziness, shortness of breath, chest pain-stop and call 911 immediately.    ED Prescriptions    None     PDMP not reviewed this encounter.   Rhys Martini, PA-C 06/25/20 1519

## 2020-06-25 NOTE — ED Triage Notes (Signed)
Pt presents with right side abdominal pain and flank pain since waking up this morning.

## 2020-06-25 NOTE — Discharge Instructions (Addendum)
-  Head to the emergency room for further evaluation and management of right lower quadrant pain.  Given the location and severity of this pain, I am concerned for appendicitis.  This does require ER evaluation for diagnosis and management, including abdominal imaging. -You can head to [facility] emergency room, or you can try our new [facility] facility, which has shorter wait times. Information below. -If your symptoms worsen while on the way, like worsening abdominal pain, new dizziness, shortness of breath, chest pain-stop and call 911 immediately.

## 2020-06-25 NOTE — ED Notes (Signed)
Called patient x3 no answer

## 2020-06-25 NOTE — ED Notes (Signed)
Patient is being discharged from the Urgent Care and sent to the Emergency Department via POV . Per Ignacia Bayley, PA, patient is in need of higher level of care due to RLQ pain. Patient is aware and verbalizes understanding of plan of care.  Vitals:   06/25/20 1422  BP: 121/77  Pulse: 86  Resp: 17  Temp: 98.7 F (37.1 C)  SpO2: 98%

## 2020-06-26 ENCOUNTER — Inpatient Hospital Stay (HOSPITAL_COMMUNITY): Payer: Medicaid Other

## 2020-06-26 ENCOUNTER — Encounter (HOSPITAL_COMMUNITY): Payer: Self-pay | Admitting: Emergency Medicine

## 2020-06-26 ENCOUNTER — Other Ambulatory Visit: Payer: Self-pay

## 2020-06-26 ENCOUNTER — Inpatient Hospital Stay (HOSPITAL_COMMUNITY)
Admission: EM | Admit: 2020-06-26 | Discharge: 2020-06-28 | DRG: 661 | Disposition: A | Discharge status: Home / Self Care | Payer: Medicaid Other | Attending: Family Medicine | Admitting: Family Medicine

## 2020-06-26 ENCOUNTER — Emergency Department (HOSPITAL_COMMUNITY): Payer: Medicaid Other

## 2020-06-26 DIAGNOSIS — Z888 Allergy status to other drugs, medicaments and biological substances status: Secondary | ICD-10-CM

## 2020-06-26 DIAGNOSIS — N209 Urinary calculus, unspecified: Secondary | ICD-10-CM

## 2020-06-26 DIAGNOSIS — Z881 Allergy status to other antibiotic agents status: Secondary | ICD-10-CM

## 2020-06-26 DIAGNOSIS — Z79899 Other long term (current) drug therapy: Secondary | ICD-10-CM | POA: Diagnosis not present

## 2020-06-26 DIAGNOSIS — F1010 Alcohol abuse, uncomplicated: Secondary | ICD-10-CM | POA: Insufficient documentation

## 2020-06-26 DIAGNOSIS — R5081 Fever presenting with conditions classified elsewhere: Secondary | ICD-10-CM | POA: Diagnosis not present

## 2020-06-26 DIAGNOSIS — Z87891 Personal history of nicotine dependence: Secondary | ICD-10-CM

## 2020-06-26 DIAGNOSIS — R509 Fever, unspecified: Secondary | ICD-10-CM | POA: Insufficient documentation

## 2020-06-26 DIAGNOSIS — F419 Anxiety disorder, unspecified: Secondary | ICD-10-CM | POA: Diagnosis present

## 2020-06-26 DIAGNOSIS — Z880 Allergy status to penicillin: Secondary | ICD-10-CM | POA: Diagnosis not present

## 2020-06-26 DIAGNOSIS — Z9851 Tubal ligation status: Secondary | ICD-10-CM | POA: Diagnosis not present

## 2020-06-26 DIAGNOSIS — D573 Sickle-cell trait: Secondary | ICD-10-CM | POA: Diagnosis present

## 2020-06-26 DIAGNOSIS — N136 Pyonephrosis: Secondary | ICD-10-CM | POA: Diagnosis present

## 2020-06-26 DIAGNOSIS — F32A Depression, unspecified: Secondary | ICD-10-CM | POA: Diagnosis present

## 2020-06-26 DIAGNOSIS — Z20822 Contact with and (suspected) exposure to covid-19: Secondary | ICD-10-CM | POA: Diagnosis present

## 2020-06-26 DIAGNOSIS — N200 Calculus of kidney: Secondary | ICD-10-CM

## 2020-06-26 DIAGNOSIS — A419 Sepsis, unspecified organism: Secondary | ICD-10-CM

## 2020-06-26 LAB — URINALYSIS, ROUTINE W REFLEX MICROSCOPIC
Bilirubin Urine: NEGATIVE
Glucose, UA: NEGATIVE mg/dL
Ketones, ur: NEGATIVE mg/dL
Nitrite: NEGATIVE
Protein, ur: NEGATIVE mg/dL
Specific Gravity, Urine: 1.006 (ref 1.005–1.030)
WBC, UA: >50 WBC/hpf — ABNORMAL HIGH (ref 0–5)
pH: 6 (ref 5.0–8.0)

## 2020-06-26 LAB — COMPREHENSIVE METABOLIC PANEL
ALT: 9 U/L (ref 0–44)
AST: 14 U/L — ABNORMAL LOW (ref 15–41)
Albumin: 3.7 g/dL (ref 3.5–5.0)
Alkaline Phosphatase: 50 U/L (ref 38–126)
Anion gap: 6 (ref 5–15)
BUN: 6 mg/dL (ref 6–20)
CO2: 26 mmol/L (ref 22–32)
Calcium: 9 mg/dL (ref 8.9–10.3)
Chloride: 103 mmol/L (ref 98–111)
Creatinine, Ser: 0.91 mg/dL (ref 0.44–1.00)
GFR, Estimated: >60 mL/min (ref >60)
Glucose, Bld: 94 mg/dL (ref 70–99)
Potassium: 4 mmol/L (ref 3.5–5.1)
Sodium: 135 mmol/L (ref 135–145)
Total Bilirubin: 1.1 mg/dL (ref 0.3–1.2)
Total Protein: 6.6 g/dL (ref 6.5–8.1)

## 2020-06-26 LAB — I-STAT BETA HCG BLOOD, ED (MC, WL, AP ONLY): I-stat hCG, quantitative: 6.6 m[IU]/mL — ABNORMAL HIGH (ref <5)

## 2020-06-26 LAB — CBC WITH DIFFERENTIAL/PLATELET
Abs Immature Granulocytes: 0.02 10*3/uL (ref 0.00–0.07)
Basophils Absolute: 0 10*3/uL (ref 0.0–0.1)
Basophils Relative: 0 %
Eosinophils Absolute: 0 10*3/uL (ref 0.0–0.5)
Eosinophils Relative: 0 %
HCT: 38.8 % (ref 36.0–46.0)
Hemoglobin: 13.2 g/dL (ref 12.0–15.0)
Immature Granulocytes: 0 %
Lymphocytes Relative: 14 %
Lymphs Abs: 1.3 10*3/uL (ref 0.7–4.0)
MCH: 30.2 pg (ref 26.0–34.0)
MCHC: 34 g/dL (ref 30.0–36.0)
MCV: 88.8 fL (ref 80.0–100.0)
Monocytes Absolute: 0.6 10*3/uL (ref 0.1–1.0)
Monocytes Relative: 7 %
Neutro Abs: 7 10*3/uL (ref 1.7–7.7)
Neutrophils Relative %: 79 %
Platelets: 234 10*3/uL (ref 150–400)
RBC: 4.37 MIL/uL (ref 3.87–5.11)
RDW: 12.9 % (ref 11.5–15.5)
WBC: 9 10*3/uL (ref 4.0–10.5)
nRBC: 0 % (ref 0.0–0.2)

## 2020-06-26 LAB — LIPASE, BLOOD: Lipase: 23 U/L (ref 11–51)

## 2020-06-26 LAB — PREGNANCY, URINE: Preg Test, Ur: NEGATIVE

## 2020-06-26 LAB — LACTIC ACID, PLASMA: Lactic Acid, Venous: 2 mmol/L (ref 0.5–1.9)

## 2020-06-26 MED ORDER — SODIUM CHLORIDE 0.9 % IV SOLN
2.0000 g | Freq: Once | INTRAVENOUS | Status: AC
  Administered 2020-06-26: 2 g via INTRAVENOUS
  Filled 2020-06-26: qty 20

## 2020-06-26 MED ORDER — TAMSULOSIN HCL 0.4 MG PO CAPS
0.4000 mg | ORAL_CAPSULE | Freq: Every day | ORAL | Status: DC
  Administered 2020-06-27 – 2020-06-28 (×2): 0.4 mg via ORAL
  Filled 2020-06-26 (×2): qty 1

## 2020-06-26 MED ORDER — THIAMINE HCL 100 MG PO TABS
100.0000 mg | ORAL_TABLET | Freq: Every day | ORAL | Status: DC
  Administered 2020-06-27 – 2020-06-28 (×2): 100 mg via ORAL
  Filled 2020-06-26 (×2): qty 1

## 2020-06-26 MED ORDER — FENTANYL CITRATE (PF) 100 MCG/2ML IJ SOLN
50.0000 ug | Freq: Once | INTRAMUSCULAR | Status: AC
  Administered 2020-06-26: 50 ug via INTRAVENOUS
  Filled 2020-06-26: qty 2

## 2020-06-26 MED ORDER — LACTATED RINGERS IV SOLN: INTRAVENOUS | Status: DC

## 2020-06-26 MED ORDER — ONDANSETRON HCL 4 MG/2ML IJ SOLN
4.0000 mg | Freq: Once | INTRAMUSCULAR | Status: AC
  Administered 2020-06-26: 4 mg via INTRAVENOUS
  Filled 2020-06-26: qty 2

## 2020-06-26 MED ORDER — HYDROMORPHONE HCL 1 MG/ML IJ SOLN
0.5000 mg | Freq: Four times a day (QID) | INTRAMUSCULAR | Status: DC | PRN
  Administered 2020-06-27: 0.5 mg via INTRAVENOUS
  Filled 2020-06-26: qty 1

## 2020-06-26 MED ORDER — ADULT MULTIVITAMIN W/MINERALS CH: 1.0000 | ORAL_TABLET | Freq: Every day | ORAL | Status: DC

## 2020-06-26 MED ORDER — TAMSULOSIN HCL 0.4 MG PO CAPS
0.4000 mg | ORAL_CAPSULE | Freq: Once | ORAL | Status: AC
  Administered 2020-06-26: 0.4 mg via ORAL
  Filled 2020-06-26: qty 1

## 2020-06-26 MED ORDER — SODIUM CHLORIDE 0.9 % IV BOLUS
1000.0000 mL | Freq: Once | INTRAVENOUS | Status: AC
  Administered 2020-06-26: 1000 mL via INTRAVENOUS

## 2020-06-26 MED ORDER — SODIUM CHLORIDE 0.9 % IV BOLUS (SEPSIS)
500.0000 mL | Freq: Once | INTRAVENOUS | Status: AC
  Administered 2020-06-26: 500 mL via INTRAVENOUS

## 2020-06-26 MED ORDER — HYDROMORPHONE HCL 1 MG/ML IJ SOLN
0.5000 mg | Freq: Four times a day (QID) | INTRAMUSCULAR | Status: DC | PRN
Start: 2020-06-26 — End: 2020-06-26

## 2020-06-26 MED ORDER — ACETAMINOPHEN 500 MG PO TABS
1000.0000 mg | ORAL_TABLET | Freq: Once | ORAL | Status: AC
  Administered 2020-06-26: 1000 mg via ORAL
  Filled 2020-06-26: qty 2

## 2020-06-26 MED ORDER — FOLIC ACID 1 MG PO TABS: 1.0000 mg | ORAL_TABLET | Freq: Every day | ORAL | Status: DC

## 2020-06-26 MED ORDER — POLYETHYLENE GLYCOL 3350 17 G PO PACK
17.0000 g | PACK | Freq: Two times a day (BID) | ORAL | Status: DC
  Administered 2020-06-27 – 2020-06-28 (×2): 17 g via ORAL
  Filled 2020-06-26 (×3): qty 1
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
## 2020-06-26 NOTE — ED Provider Notes (Signed)
[facility] Provider Note   CSN: [id number] Arrival date & time: 06/26/20  1811     History Chief Complaint  Patient presents with  . Abdominal Pain    Brooke Lucas is a 35 y.o. female with PMH/o Depression, trich who presents for evaluation of right-sided abdominal pain that is been ongoing for the last 3 days.  She states it radiates to her right flank.  She states she has not had any nausea/vomiting/diarrhea.  She was seen yesterday urgent care and was advised to go to the emergency department for further evaluation but she did not come.  She reports that today, the pain was worsening which is what prompted her to come to the emergency department.  She states she has had some decreased appetite but states she has not had any vomiting.  She has not had any dysuria, hematuria.  She has had tubal ligation but no other abdominal surgeries.  She denies any chest pain, difficulty breathing, dysuria, hematuria.  The history is provided by the patient.       Past Medical History:  Diagnosis Date  . Anxiety   . Depression    after loss of son  . Sickle cell trait (HCC)   . Trichomonas     Patient Active Problem List   Diagnosis Date Noted  . Kidney stone 06/26/2020  . Alcohol abuse   . Fever   . ASCUS with positive high risk HPV cervical 12/19/2019  . History of bilateral tubal ligation 07/24/2016  . Tobacco abuse 07/24/2016  . Unwanted fertility 04/24/2016    Past Surgical History:  Procedure Laterality Date  . extraction wisdom    . TUBAL LIGATION N/A 05/22/2016   Procedure: POST PARTUM TUBAL LIGATION;  Surgeon: Hermina StaggersMichael L Ervin, MD;  [facility];  Service: Gynecology;  Laterality: N/A;     OB History    Gravida  3   Para  3   Term  3   Preterm  0   AB  0   Living  2     SAB  0   IAB  0   Ectopic  0   Multiple  0   Live Births  2           Family History  Problem Relation Age of Onset   . Cancer Brother   . Birth defects Son        heart  . Heart disease Maternal Grandmother   . Arthritis Paternal Grandmother   . Hypertension Mother   . Other Father        Gunshot    Social History   Tobacco Use  . Smoking status: Former Smoker    Packs/day: 0.25    Years: 0.00    Pack years: 0.00    Types: Cigarettes    Quit date: 07/15/2019    Years since quitting: 0.9  . Smokeless tobacco: Never Used  . Tobacco comment: Pt recently stopped cold [place] 07/15/2019  Vaping Use  . Vaping Use: Never used  Substance Use Topics  . Alcohol use: Yes    Alcohol/week: 0.0 standard drinks    Comment: Socially  . Drug use: Yes    Types: Marijuana    Comment: PT IS +UDS 05/22/16    Home Medications Prior to Admission medications   Medication Sig Start Date End Date Taking? Authorizing Provider  Cetirizine HCl 10 MG CAPS Take 1 capsule (10 mg total) by mouth daily. 11/03/18  08/05/19  Wieters, Hallie C, PA-C    Allergies    Amoxicillin, Penicillins, and Benadryl [diphenhydramine]  Review of Systems   Review of Systems  Constitutional: Positive for appetite change and fever.  Respiratory: Negative for cough and shortness of breath.   Cardiovascular: Negative for chest pain.  Gastrointestinal: Positive for abdominal pain. Negative for nausea and vomiting.  Genitourinary: Positive for flank pain. Negative for dysuria and hematuria.  Neurological: Negative for headaches.  All other systems reviewed and are negative.   Physical Exam Updated Vital Signs BP (!) 85/62   Pulse 88   Temp 99 F (37.2 C) (Oral)   Resp (!) 22   Ht 5\' 7"  (1.702 m)   Wt 68 kg   LMP 06/11/2020   SpO2 100%   BMI 23.49 kg/m   Physical Exam Vitals and nursing note reviewed.  Constitutional:      Appearance: Normal appearance. She is well-developed.  HENT:     Head: Normocephalic and atraumatic.  Eyes:     General: Lids are normal.     Conjunctiva/sclera: Conjunctivae normal.     Pupils: Pupils  are equal, round, and reactive to light.  Cardiovascular:     Rate and Rhythm: Normal rate and regular rhythm.     Pulses: Normal pulses.     Heart sounds: Normal heart sounds. No murmur heard. No friction rub. No gallop.   Pulmonary:     Effort: Pulmonary effort is normal.     Breath sounds: Normal breath sounds.     Comments: Lungs clear to auscultation bilaterally.  Symmetric chest rise.  No wheezing, rales, rhonchi. Abdominal:     Palpations: Abdomen is soft. Abdomen is not rigid.     Tenderness: There is abdominal tenderness in the right lower quadrant. There is right CVA tenderness. There is no guarding.     Comments: Abdomen soft, nondistended.  Tenderness palpation in the right lower quadrant.  No rigidity, guarding.  Right-sided CVA tenderness noted.  Musculoskeletal:        General: Normal range of motion.     Cervical back: Full passive range of motion without pain.  Skin:    General: Skin is warm and dry.     Capillary Refill: Capillary refill takes less than 2 seconds.  Neurological:     Mental Status: She is alert and oriented to person, place, and time.  Psychiatric:        Speech: Speech normal.     ED Results / Procedures / Treatments   Labs (all labs ordered are listed, but only abnormal results are displayed) Labs Reviewed  COMPREHENSIVE METABOLIC PANEL - Abnormal; Notable for the following components:      Result Value   AST 14 (*)    All other components within normal limits  URINALYSIS, ROUTINE W REFLEX MICROSCOPIC - Abnormal; Notable for the following components:   APPearance HAZY (*)    Hgb urine dipstick MODERATE (*)    Leukocytes,Ua LARGE (*)    WBC, UA >50 (*)    Bacteria, UA RARE (*)    All other components within normal limits  LACTIC ACID, PLASMA - Abnormal; Notable for the following components:   Lactic Acid, Venous 2.0 (*)    All other components within normal limits  I-STAT BETA HCG BLOOD, ED ([facility], [facility], [facility] ONLY) - Abnormal; Notable for the  following components:   I-stat hCG, quantitative 6.6 (*)    All other components within normal limits  URINE CULTURE  CULTURE,  BLOOD (ROUTINE X 2)  CULTURE, BLOOD (ROUTINE X 2)  RESP PANEL BY RT-PCR (FLU A&B, COVID) ARPGX2  CBC WITH DIFFERENTIAL/PLATELET  LIPASE, BLOOD  PREGNANCY, URINE  LACTIC ACID, PLASMA    EKG None  Radiology CT ABDOMEN PELVIS WO CONTRAST  Result Date: 06/26/2020 CLINICAL DATA:  Right-sided flank pain, fevers EXAM: CT ABDOMEN AND PELVIS WITHOUT CONTRAST TECHNIQUE: Multidetector CT imaging of the abdomen and pelvis was performed following the standard protocol without IV contrast. COMPARISON:  09/08/2010 FINDINGS: Lower chest: No acute abnormality. Hepatobiliary: No focal liver abnormality is seen. No gallstones, gallbladder wall thickening, or biliary dilatation. Pancreas: Unremarkable. No pancreatic ductal dilatation or surrounding inflammatory changes. Spleen: Normal in size without focal abnormality. Adrenals/Urinary Tract: Adrenal glands are within normal limits. Kidneys demonstrate no renal calculi. The collecting system of the right kidney is somewhat prominent with some mild stranding surrounding the collecting system and proximal ureter. There is a 6 mm calcification identified in the expected region of the mid to distal right ureter which was not seen on the prior exam and may represent a ureteral stone. This is best visualized on coronal image number 71 of series 6. Bladder is well distended. No obstructive changes on the left are noted. Stomach/Bowel: No obstructive or inflammatory changes of the colon are seen. The appendix is within normal limits. No small bowel or gastric abnormality is noted. Vascular/Lymphatic: No significant vascular findings are present. No enlarged abdominal or pelvic lymph nodes. Reproductive: Uterus and bilateral adnexa are unremarkable. Other: No abdominal wall hernia or abnormality. No abdominopelvic ascites. Musculoskeletal: No acute or  significant osseous findings. IMPRESSION: Mild fullness of the right renal collecting system with right perinephric stranding and findings highly suggestive of a mid to distal right ureteral stone measuring 6 mm. Normal-appearing appendix. No other focal abnormality is noted. Electronically Signed   By: Alcide Clever M.D.   On: 06/26/2020 21:03   DG Chest Port 1 View  Result Date: 06/26/2020 CLINICAL DATA:  Questionable sepsis, 10/10 right lower quadrant pain radiating to right flank EXAM: PORTABLE CHEST 1 VIEW COMPARISON:  01/13/2018 FINDINGS: No consolidation, features of edema, pneumothorax, or effusion. Pulmonary vascularity is normally distributed. The cardiomediastinal contours are unremarkable. No acute osseous or soft tissue abnormality. Telemetry leads overlie the chest. IMPRESSION: No acute cardiopulmonary abnormality. Electronically Signed   By: Kreg Shropshire M.D.   On: 06/26/2020 23:21    Procedures Procedures   Medications Ordered in ED Medications  lactated ringers infusion (has no administration in time range)  polyethylene glycol (MIRALAX / GLYCOLAX) packet 17 g (has no administration in time range)  HYDROmorphone (DILAUDID) injection 0.5 mg (has no administration in time range)  tamsulosin (FLOMAX) capsule 0.4 mg (has no administration in time range)  multivitamin with minerals tablet 1 tablet (has no administration in time range)  thiamine tablet 100 mg (has no administration in time range)  folic acid (FOLVITE) tablet 1 mg (has no administration in time range)  acetaminophen (TYLENOL) tablet 1,000 mg (1,000 mg Oral Given 06/26/20 1843)  fentaNYL (SUBLIMAZE) injection 50 mcg (50 mcg Intravenous Given 06/26/20 2135)  ondansetron (ZOFRAN) injection 4 mg (4 mg Intravenous Given 06/26/20 2137)  sodium chloride 0.9 % bolus 1,000 mL (0 mLs Intravenous Stopped 06/26/20 2331)  cefTRIAXone (ROCEPHIN) 2 g in sodium chloride 0.9 % 100 mL IVPB (0 g Intravenous Stopped 06/26/20 2209)   tamsulosin (FLOMAX) capsule 0.4 mg (0.4 mg Oral Given 06/26/20 2223)  sodium chloride 0.9 % bolus 500 mL (0 mLs Intravenous  Stopped 06/26/20 2347)    ED Course  I have reviewed the triage vital signs and the nursing notes.  Pertinent labs & imaging results that were available during my care of the patient were reviewed by me and considered in my medical decision making (see chart for details).    MDM Rules/Calculators/A&P                          35 year old female who presents for evaluation of right lower quadrant pain x3 days.  She has had some decreased appetite.  No nausea/vomiting/diarrhea.  No urinary complaints.  Initially arrival, she is febrile, vitals otherwise stable.  On exam, she has tenderness noted to the right lower quadrant diffusely and into the right flank.  Consider appendicitis versus pyelonephritis versus UTI versus kidney stone.  Labs ordered at triage.  Urine pregnancy negative.  Lipase normal.  CMP is normal.  UA shows moderate hemoglobin, large leukocytes, pyuria.  CBC shows no leukocytosis or anemia.  Urine culture sent.  Given national shortage of iodine contrast, CT and pelvis without contrast was ordered.  CT ab pelvis shows mild fullness of the right renal collecting system with right perinephric stranding findings suggestive of mid to distal right-year-old stone measuring approximately 6 mm.  Discussed patient with Dr. Hart Robinsons (Urology) who will review patient's imaging and discussed with me.  Discussed patient with Dr. Hart Robinsons. Patient ate a candy bar while she was in the waiting room before coming back to the ED.  We estimate that this is about 2 to 3 hours ago.  Because she just recently ate something, she cannot go get a stent tonight.  They have are posted for stent tomorrow morning at 5 AM.  They recommend continued fluids, Flomax as well as medical admission.  Lactic returned at 2.  Code sepsis initiated.  Patient already given antibiotics, blood cultures,  urine culture sent.  She is slightly hypotensive.  Given her weight, her 30 cc/kg of fluid would equal to 2040 cc of fluid.  Patient given 2.5 L  of fluid.  Discussed patient with Internal medicine team who accepts patient for admission.   Portions of this note were generated with Scientist, clinical (histocompatibility and immunogenetics). Dictation errors may occur despite best attempts at proofreading.   Final Clinical Impression(s) / ED Diagnoses Final diagnoses:  Sepsis, due to unspecified organism, unspecified whether acute organ dysfunction present [facility])  Kidney stone    Rx / DC Orders ED Discharge Orders    None       Rosana Hoes 06/26/20 2351    Palumbo, April, MD 06/27/20 2328

## 2020-06-26 NOTE — H&P (Addendum)
Family Medicine Teaching [facility] Admission History and Physical Service Pager: [phone]  Patient name: Brooke Lucas Medical record number: [id number] Date of birth: Jan 26, 1986 Age: 35 y.o. Gender: female  Primary Care Provider: Patient, No Pcp Per (Inactive) Consultants: Urology Code Status: FULL Preferred Emergency Contact: Extended Emergency Contact Information Primary Emergency Contact: Hurless,Patricia Address: [address]          [place], [place] [address] [place] Home Phone: [phone] Relation: Mother   Chief Complaint: Abdominal Pain, Back pain  Assessment and Plan: Brooke Lucas is a 35 y.o. female presenting with right-sided abdominal pain 2/2 obstructing ureteral stone. PMH is significant for anxiety, depression, sickle cell trait, tobacco use.   Right flank pain 2/2 6mm obstructing ureteral stone with hydronephrosis Right-sided flank, back and right lower quadrant abdominal pain x3 days.  Febrile in ED to 102.1, resolved with antipyretics. CBC without leukocytosis. Electrolytes and kidney function stable. Lactic acid 2, s/p 1L NS bolus. VSS. UA with moderate hemoglobin, large leukoocytes, >50 WBC, rare bacteria and mucus present. CT scan with 6 mm stone identified in the mid to distal right ureter with perinephric stranding. Started on CTX. Urology consulted in ED and will proceed with urgent ureteral stenting tomorrow morning.  Will admit for continued antibiotics, fluid hydration and pain control prior to surgery tomorrow. -Admit to med-surg, attending Dr. Lum Babe -Urology following, appreciate care and recommendations -Vitals per floor protocol -N.p.o. for OR procedure 5/18 -Continue Tamsulosin daily -mIVF  -F/u lactic acid -F/u Urine culture -F/u blood cultures  -Continue CTX (5/17-) -Pain control with IV Dilaudid 0.5mg  q6h PRN   Anxiety  Depression Listed in history. Per chart review, appears that patient has lost a son. Not on  any medications currently.  -Follow up outpatient  Sickle Cell Trait CBC WNL, Hgb 13.2.  Tobacco Use  Alcohol Abuse -Declined nicotine patch. Patient also admits to drinking 1/2 pint to 1/2 a gallon of tequila on the weekends (Friday's and Saturday's) with friends. Last drink 5/14. Denies history of alcohol withdrawals or seizures.  -Encourage cessation -Offer Nicotine patch if withdrawals -CIWA without ativan  -MVI, Thiamine, folic acid  FEN/GI: NPO Prophylaxis: SCD's  Disposition: med-surg  History of Present Illness:  Brooke Lucas is a 35 y.o. female presenting with right-sided flank and back pain x3 days.  Patient reports that the pain started on Sunday night.  It worsened Monday morning and she was seen at urgent care.  Urgent care had advised her to present to the emergency department for further evaluation and suspicion that she may have a appendicitis.  Patient states that she took half a Percocet tablet from her friend after leaving urgent care and was knocked out.  She presented to the emergency department today due to continued pain.  Rates her pain as 9/10 and describes it as sharp.  She feels it in her right lower quadrant and it radiates to her flank and right back.  She has never had the symptoms before.  She endorses drinking lots of water daily, about 8 bottles a day.  She is not on any current medications.  She denies nausea, vomiting, shortness of breath.  Denies pain with voiding but has noted decreased urine output and frequency.  She notes some brown discoloration with wiping but also adds that she recently ended her period.  Patient admits to social drinking, drinking heavily on Fridays and Saturdays.  Her beverage of choice is tequila.  She oftentimes will have half  of 1/5 to half of a gallon with her friends.  She denies history of alcohol withdrawals or seizures.  She is not interested in cutting back at this time.   ED Course: Febrile 102.1. Given tylenol and  fentanyl x1 for pain. Zofran x1. Tamsulosin x1. Started on CTX. Received 1L NS bolus.  Urology consulted.  FPTS consulted for admission.  Review Of Systems: Per HPI with the following additions:   Review of Systems  Constitutional: Negative for fever.  Respiratory: Negative for shortness of breath.   Cardiovascular: Negative for chest pain.  Gastrointestinal: Positive for abdominal pain. Negative for nausea and vomiting.  Genitourinary: Positive for decreased urine volume, difficulty urinating, flank pain and hematuria. Negative for dysuria.  Musculoskeletal: Positive for back pain.  Neurological: Negative for light-headedness.     Patient Active Problem List   Diagnosis Date Noted  . ASCUS with positive high risk HPV cervical 12/19/2019  . History of bilateral tubal ligation 07/24/2016  . Tobacco abuse 07/24/2016  . Unwanted fertility 04/24/2016    Past Medical History: Past Medical History:  Diagnosis Date  . Anxiety   . Depression    after loss of son  . Sickle cell trait (HCC)   . Trichomonas     Past Surgical History: Past Surgical History:  Procedure Laterality Date  . extraction wisdom    . TUBAL LIGATION N/A 05/22/2016   Procedure: POST PARTUM TUBAL LIGATION;  Surgeon: Hermina Staggers, MD;  Location: [facility] SU[facility];  Service: Gynecology;  Laterality: N/A;    Social History: Social History   Tobacco Use  . Smoking status: Former Smoker    Packs/day: 0.25    Years: 0.00    Pack years: 0.00    Types: Cigarettes    Quit date: 07/15/2019    Years since quitting: 0.9  . Smokeless tobacco: Never Used  . Tobacco comment: Pt recently stopped cold [place] 07/15/2019  Vaping Use  . Vaping Use: Never used  Substance Use Topics  . Alcohol use: Yes    Alcohol/week: 0.0 standard drinks    Comment: Socially  . Drug use: Yes    Types: Marijuana    Comment: PT IS +UDS 05/22/16   Please also refer to relevant sections of EMR.  Family History: Family History   Problem Relation Age of Onset  . Cancer Brother   . Birth defects Son        heart  . Heart disease Maternal Grandmother   . Arthritis Paternal Grandmother   . Hypertension Mother   . Other Father        Gunshot    Allergies and Medications: Allergies  Allergen Reactions  . Amoxicillin Anaphylaxis    Has patient had a PCN reaction causing immediate rash, facial/tongue/throat swelling, SOB or lightheadedness with hypotension: Yes Has patient had a PCN reaction causing severe rash involving mucus membranes or skin necrosis: Yes Has patient had a PCN reaction that required hospitalization No Has patient had a PCN reaction occurring within the last 10 years: No If all of the above answers are "NO", then may proceed with Cephalosporin use.  Marland Kitchen Penicillins Anaphylaxis    Has patient had a PCN reaction causing immediate rash, facial/tongue/throat swelling, SOB or lightheadedness with hypotension: Yes Has patient had a PCN reaction causing severe rash involving mucus membranes or skin necrosis: Yes Has patient had a PCN reaction that required hospitalization No Has patient had a PCN reaction occurring within the last 10 years: No If all of  the above answers are "NO", then may proceed with Cephalosporin use.  . Benadryl [Diphenhydramine] Anxiety    Panic, feels like she cant breathing   No current facility-administered medications on file prior to encounter.   Current Outpatient Medications on File Prior to Encounter  Medication Sig Dispense Refill  . fluticasone (FLONASE) 50 MCG/ACT nasal spray Place 1-2 sprays into both nostrils daily for 7 days. 1 g 0  . [DISCONTINUED] Cetirizine HCl 10 MG CAPS Take 1 capsule (10 mg total) by mouth daily. 15 capsule 0    Objective: BP (!) 85/62   Pulse 88   Temp 99 F (37.2 C) (Oral)   Resp (!) 22   Ht 5\' 7"  (1.702 m)   Wt 68 kg   LMP 06/11/2020   SpO2 100%   BMI 23.49 kg/m  Exam: General: Awake, alert, in mild distress Eyes: Sclera  anicteric Neck: Supple Cardiovascular: RRR, without murmur Respiratory: CTA B without wheezing/rhonchi/rales Gastrointestinal: Soft, nondistended, pain to palpation of right lower quadrant without rebound or guarding MSK: Moves all extremities spontaneously, without obvious deformities Derm: Warm and dry, without edema Neuro: Follows commands, speech is clear, answers questions appropriately, no focal deficits Psych: Mood appropriate  Labs and Imaging: CBC BMET  Recent Labs  Lab 06/26/20 1836  WBC 9.0  HGB 13.2  HCT 38.8  PLT 234   Recent Labs  Lab 06/26/20 1836  NA 135  K 4.0  CL 103  CO2 26  BUN 6  CREATININE 0.91  GLUCOSE 94  CALCIUM 9.0     EKG: NSR rate 80   Sabino Dick, DO 06/26/2020, 10:54 PM PGY-1, [facility] FPTS Intern pager: [phone], text pages welcome

## 2020-06-26 NOTE — ED Triage Notes (Signed)
Pt reports 10/10 RLQ pain that radiates to rt flank that started yesterday. Pt denies any urinary sx. Denies n/v/d. Pt sent from urgent care for concern for appendicitis. Fever in triage of 102.85F.

## 2020-06-26 NOTE — ED Provider Notes (Signed)
Emergency Medicine Provider Triage Evaluation Note  HITOMI SLAPE , a 35 y.o. female  was evaluated in triage.  Pt complains of left-sided abdominal pain.  Patient states symptoms began 3 days ago.  She was at urgent care yesterday, recommended she come to the ER, but she cannot make it.  Today she reports pain is worsening.  She continues to have severe right-sided pain.  Associated nausea, vomiting.  No fevers or chills.  No cough.  No history of similar.  No previous history of kidney stones or gallbladder problems.  No previous abdominal surgeries.  No urinary symptoms or abnormal bowel movements.  Review of Systems  Positive: abd pain Negative: Urinary sxs or abnormal BMs  Physical Exam  LMP 06/11/2020  Gen:   Awake, no distress   Resp:  Normal effort  MSK:   Moves extremities without difficulty  Other:  Tenderness palpation of right side abdomen, epigastric abdomen, and right CVA.  Negative Rovsing's.  Medical Decision Making  Medically screening exam initiated at 6:34 PM.  Appropriate orders placed.  LORISSA KISHBAUGH was informed that the remainder of the evaluation will be completed by another provider, this initial triage assessment does not replace that evaluation, and the importance of remaining in the ED until their evaluation is complete.  Labs, urine, and ct ordered   Alveria Apley, PA-C 06/26/20 1836    Blane Ohara, MD 06/27/20 [phone]

## 2020-06-26 NOTE — H&P (Addendum)
Urology H&P  Physician requesting consult: April Palumbo  Reason for consult: obstructing ureteral stone  History of Present Illness: Brooke Lucas is a 35 y.o.  Who presented to ED with 3-4 days of abdominal and right flank pain found to have fever on arrival to ED and a 77mm right mid ureteral stone with associated hydronephrosis. She denies associated fevers at home, chills, or nausea, vomiting. No leukocytosis. Cr wnl. UA with large WBC and rare bacteria. Vitals stable in ED, Pain controlled. She takes no blood thinning medications. She last ate around 7:30pm.   She denies a history of voiding or storage urinary symptoms, hematuria,, STDs,GU malignancy/trauma/surgery.  Past Medical History:  Diagnosis Date  . Anxiety   . Depression    after loss of son  . Sickle cell trait (HCC)   . Trichomonas     Past Surgical History:  Procedure Laterality Date  . extraction wisdom    . TUBAL LIGATION N/A 05/22/2016   Procedure: POST PARTUM TUBAL LIGATION;  Surgeon: Hermina Staggers, MD;  Location: [facility]IR[facility] SU[facility];  Service: Gynecology;  Laterality: N/A;     Current Hospital Medications:  Home meds:  No current facility-administered medications on file prior to encounter.   Current Outpatient Medications on File Prior to Encounter  Medication Sig Dispense Refill  . fluticasone (FLONASE) 50 MCG/ACT nasal spray Place 1-2 sprays into both nostrils daily for 7 days. 1 g 0  . [DISCONTINUED] Cetirizine HCl 10 MG CAPS Take 1 capsule (10 mg total) by mouth daily. 15 capsule 0     Scheduled Meds: . tamsulosin  0.4 mg Oral Once   Continuous Infusions: . cefTRIAXone (ROCEPHIN)  IV 2 g (06/26/20 2134)   PRN Meds:.  Allergies:  Allergies  Allergen Reactions  . Amoxicillin Anaphylaxis    Has patient had a PCN reaction causing immediate rash, facial/tongue/throat swelling, SOB or lightheadedness with hypotension: Yes Has patient had a PCN reaction causing severe rash involving mucus  membranes or skin necrosis: Yes Has patient had a PCN reaction that required hospitalization No Has patient had a PCN reaction occurring within the last 10 years: No If all of the above answers are "NO", then may proceed with Cephalosporin use.  Marland Kitchen Penicillins Anaphylaxis    Has patient had a PCN reaction causing immediate rash, facial/tongue/throat swelling, SOB or lightheadedness with hypotension: Yes Has patient had a PCN reaction causing severe rash involving mucus membranes or skin necrosis: Yes Has patient had a PCN reaction that required hospitalization No Has patient had a PCN reaction occurring within the last 10 years: No If all of the above answers are "NO", then may proceed with Cephalosporin use.  . Benadryl [Diphenhydramine] Anxiety    Panic, feels like she cant breathing    Family History  Problem Relation Age of Onset  . Cancer Brother   . Birth defects Son        heart  . Heart disease Maternal Grandmother   . Arthritis Paternal Grandmother   . Hypertension Mother   . Other Father        Gunshot    Social History:  reports that she quit smoking about a year ago. Her smoking use included cigarettes. She smoked 0.25 packs per day for 0.00 years. She has never used smokeless tobacco. She reports current alcohol use. She reports current drug use. Drug: Marijuana.  ROS: A complete review of systems was performed.  All systems are negative except for pertinent findings as noted.  Physical  Exam:  Vital signs in last 24 hours: Temp:  [102.1 F (38.9 C)] 102.1 F (38.9 C) (05/17 1836) Pulse Rate:  [87-100] 87 (05/17 2115) Resp:  [18-23] 23 (05/17 2115) BP: (106-125)/(65-80) 113/67 (05/17 2115) SpO2:  [99 %-100 %] 99 % (05/17 2115) Weight:  [68 kg] 68 kg (05/17 1838) Constitutional:  Alert and oriented, No acute distress Cardiovascular: Regular rate and rhythm, No JVD Respiratory: Normal respiratory effort, Lungs clear bilaterally GI: Abdomen is soft, nontender,  nondistended, no abdominal masses GU: No CVA tenderness Lymphatic: No lymphadenopathy Neurologic: Grossly intact, no focal deficits Psychiatric: Normal mood and affect  Laboratory Data:  Recent Labs    06/26/20 1836  WBC 9.0  HGB 13.2  HCT 38.8  PLT 234    Recent Labs    06/26/20 1836  NA 135  K 4.0  CL 103  GLUCOSE 94  BUN 6  CALCIUM 9.0  CREATININE 0.91     Results for orders placed or performed during the hospital encounter of 06/26/20 (from the past 24 hour(s))  CBC with Differential     Status: None   Collection Time: 06/26/20  6:36 PM  Result Value Ref Range   WBC 9.0 4.0 - 10.5 K/uL   RBC 4.37 3.87 - 5.11 MIL/uL   Hemoglobin 13.2 12.0 - 15.0 g/dL   HCT 93.7 16.9 - 67.8 %   MCV 88.8 80.0 - 100.0 fL   MCH 30.2 26.0 - 34.0 pg   MCHC 34.0 30.0 - 36.0 g/dL   RDW 93.8 10.1 - 75.1 %   Platelets 234 150 - 400 K/uL   nRBC 0.0 0.0 - 0.2 %   Neutrophils Relative % 79 %   Neutro Abs 7.0 1.7 - 7.7 K/uL   Lymphocytes Relative 14 %   Lymphs Abs 1.3 0.7 - 4.0 K/uL   Monocytes Relative 7 %   Monocytes Absolute 0.6 0.1 - 1.0 K/uL   Eosinophils Relative 0 %   Eosinophils Absolute 0.0 0.0 - 0.5 K/uL   Basophils Relative 0 %   Basophils Absolute 0.0 0.0 - 0.1 K/uL   Immature Granulocytes 0 %   Abs Immature Granulocytes 0.02 0.00 - 0.07 K/uL  Comprehensive metabolic panel     Status: Abnormal   Collection Time: 06/26/20  6:36 PM  Result Value Ref Range   Sodium 135 135 - 145 mmol/L   Potassium 4.0 3.5 - 5.1 mmol/L   Chloride 103 98 - 111 mmol/L   CO2 26 22 - 32 mmol/L   Glucose, Bld 94 70 - 99 mg/dL   BUN 6 6 - 20 mg/dL   Creatinine, Ser 0.25 0.44 - 1.00 mg/dL   Calcium 9.0 8.9 - 85.2 mg/dL   Total Protein 6.6 6.5 - 8.1 g/dL   Albumin 3.7 3.5 - 5.0 g/dL   AST 14 (L) 15 - 41 U/L   ALT 9 0 - 44 U/L   Alkaline Phosphatase 50 38 - 126 U/L   Total Bilirubin 1.1 0.3 - 1.2 mg/dL   GFR, Estimated >77 >82 mL/min   Anion gap 6 5 - 15  Lipase, blood     Status: None    Collection Time: 06/26/20  6:36 PM  Result Value Ref Range   Lipase 23 11 - 51 U/L  Urinalysis, Routine w reflex microscopic Urine, Catheterized     Status: Abnormal   Collection Time: 06/26/20  6:36 PM  Result Value Ref Range   Color, Urine YELLOW YELLOW   APPearance HAZY (A)  CLEAR   Specific Gravity, Urine 1.006 1.005 - 1.030   pH 6.0 5.0 - 8.0   Glucose, UA NEGATIVE NEGATIVE mg/dL   Hgb urine dipstick MODERATE (A) NEGATIVE   Bilirubin Urine NEGATIVE NEGATIVE   Ketones, ur NEGATIVE NEGATIVE mg/dL   Protein, ur NEGATIVE NEGATIVE mg/dL   Nitrite NEGATIVE NEGATIVE   Leukocytes,Ua LARGE (A) NEGATIVE   RBC / HPF 6-10 0 - 5 RBC/hpf   WBC, UA >50 (H) 0 - 5 WBC/hpf   Bacteria, UA RARE (A) NONE SEEN   Squamous Epithelial / LPF 0-5 0 - 5   Mucus PRESENT   I-Stat Beta hCG blood, ED ([facility], [facility], [facility] only)     Status: Abnormal   Collection Time: 06/26/20  6:51 PM  Result Value Ref Range   I-stat hCG, quantitative 6.6 (H) <5 mIU/mL   Comment 3          Pregnancy, urine     Status: None   Collection Time: 06/26/20  7:21 PM  Result Value Ref Range   Preg Test, Ur NEGATIVE NEGATIVE   No results found for this or any previous visit (from the past 240 hour(s)).  Renal Function: Recent Labs    06/26/20 1836  CREATININE 0.91   Estimated Creatinine Clearance: 85.5 mL/min (by C-G formula based on SCr of 0.91 mg/dL).  Radiologic Imaging: CT ABDOMEN PELVIS WO CONTRAST  Result Date: 06/26/2020 CLINICAL DATA:  Right-sided flank pain, fevers EXAM: CT ABDOMEN AND PELVIS WITHOUT CONTRAST TECHNIQUE: Multidetector CT imaging of the abdomen and pelvis was performed following the standard protocol without IV contrast. COMPARISON:  09/08/2010 FINDINGS: Lower chest: No acute abnormality. Hepatobiliary: No focal liver abnormality is seen. No gallstones, gallbladder wall thickening, or biliary dilatation. Pancreas: Unremarkable. No pancreatic ductal dilatation or surrounding inflammatory changes. Spleen: Normal  in size without focal abnormality. Adrenals/Urinary Tract: Adrenal glands are within normal limits. Kidneys demonstrate no renal calculi. The collecting system of the right kidney is somewhat prominent with some mild stranding surrounding the collecting system and proximal ureter. There is a 6 mm calcification identified in the expected region of the mid to distal right ureter which was not seen on the prior exam and may represent a ureteral stone. This is best visualized on coronal image number 71 of series 6. Bladder is well distended. No obstructive changes on the left are noted. Stomach/Bowel: No obstructive or inflammatory changes of the colon are seen. The appendix is within normal limits. No small bowel or gastric abnormality is noted. Vascular/Lymphatic: No significant vascular findings are present. No enlarged abdominal or pelvic lymph nodes. Reproductive: Uterus and bilateral adnexa are unremarkable. Other: No abdominal wall hernia or abnormality. No abdominopelvic ascites. Musculoskeletal: No acute or significant osseous findings. IMPRESSION: Mild fullness of the right renal collecting system with right perinephric stranding and findings highly suggestive of a mid to distal right ureteral stone measuring 6 mm. Normal-appearing appendix. No other focal abnormality is noted. Electronically Signed   By: Alcide CleverMark  Lukens M.D.   On: 06/26/2020 21:03    I independently reviewed the above imaging studies.  Impression/Recommendation: 35 yo with infected obstructing right ureteral stone. She is febrile but labs are reassuring and patient is stable. Given she ate a few hours ago but is stable at this time, will plan for urgent ureteral stenting after the NPO window.   -admit for continued IVF and abx - fu UCx and adjust abx as needed - COVID19 testing - NPO - plan for OR 5/18 am  after NPO window for right ureteral stent - patient will need definitive stone procedure at a later date as an outpatient with  urology   Adebayo Ensminger [facility] 06/26/2020, 10:02 PM

## 2020-06-27 ENCOUNTER — Inpatient Hospital Stay (HOSPITAL_COMMUNITY): Payer: Medicaid Other

## 2020-06-27 ENCOUNTER — Inpatient Hospital Stay (HOSPITAL_COMMUNITY): Payer: Medicaid Other | Admitting: Anesthesiology

## 2020-06-27 ENCOUNTER — Encounter (HOSPITAL_COMMUNITY)
Admission: EM | Disposition: A | Discharge status: Home / Self Care | Payer: Self-pay | Source: Home / Self Care | Attending: Family Medicine

## 2020-06-27 HISTORY — PX: CYSTOSCOPY W/ URETERAL STENT PLACEMENT: SHX1429

## 2020-06-27 LAB — RESP PANEL BY RT-PCR (FLU A&B, COVID) ARPGX2
Influenza A by PCR: NEGATIVE
Influenza B by PCR: NEGATIVE
SARS Coronavirus 2 by RT PCR: NEGATIVE

## 2020-06-27 LAB — LACTIC ACID, PLASMA: Lactic Acid, Venous: 0.9 mmol/L (ref 0.5–1.9)

## 2020-06-27 SURGERY — CYSTOSCOPY, WITH RETROGRADE PYELOGRAM AND URETERAL STENT INSERTION
Anesthesia: General | Site: Ureter | Laterality: Right

## 2020-06-27 MED ORDER — FENTANYL CITRATE (PF) 100 MCG/2ML IJ SOLN
25.0000 ug | INTRAMUSCULAR | Status: DC | PRN
  Administered 2020-06-27: 25 ug via INTRAVENOUS

## 2020-06-27 MED ORDER — FENTANYL CITRATE (PF) 100 MCG/2ML IJ SOLN
INTRAMUSCULAR | Status: AC
  Filled 2020-06-27: qty 2

## 2020-06-27 MED ORDER — PROPOFOL 10 MG/ML IV BOLUS
INTRAVENOUS | Status: AC
  Filled 2020-06-27: qty 40

## 2020-06-27 MED ORDER — SODIUM CHLORIDE 0.9 % IR SOLN
Status: DC | PRN
  Administered 2020-06-27: 3000 mL

## 2020-06-27 MED ORDER — FENTANYL CITRATE (PF) 250 MCG/5ML IJ SOLN
INTRAMUSCULAR | Status: DC | PRN
  Administered 2020-06-27 (×2): 50 ug via INTRAVENOUS

## 2020-06-27 MED ORDER — MIDAZOLAM HCL 2 MG/2ML IJ SOLN
INTRAMUSCULAR | Status: AC
  Filled 2020-06-27: qty 2

## 2020-06-27 MED ORDER — FENTANYL CITRATE (PF) 250 MCG/5ML IJ SOLN
INTRAMUSCULAR | Status: AC
  Filled 2020-06-27: qty 5

## 2020-06-27 MED ORDER — ONDANSETRON HCL 4 MG/2ML IJ SOLN
INTRAMUSCULAR | Status: DC | PRN
  Administered 2020-06-27: 4 mg via INTRAVENOUS

## 2020-06-27 MED ORDER — SODIUM CHLORIDE 0.9 % IV SOLN
2.0000 g | INTRAVENOUS | Status: DC
  Administered 2020-06-27: 2 g via INTRAVENOUS
  Filled 2020-06-27 (×3): qty 20

## 2020-06-27 MED ORDER — IOHEXOL 300 MG/ML  SOLN
INTRAMUSCULAR | Status: DC | PRN
  Administered 2020-06-27: 50 mL via URETHRAL

## 2020-06-27 MED ORDER — MIDAZOLAM HCL 5 MG/5ML IJ SOLN
INTRAMUSCULAR | Status: DC | PRN
  Administered 2020-06-27: 2 mg via INTRAVENOUS

## 2020-06-27 MED ORDER — IBUPROFEN 600 MG PO TABS
600.0000 mg | ORAL_TABLET | Freq: Four times a day (QID) | ORAL | Status: DC | PRN
  Administered 2020-06-27 – 2020-06-28 (×3): 600 mg via ORAL
  Filled 2020-06-27 (×4): qty 1

## 2020-06-27 MED ORDER — LIDOCAINE HCL (CARDIAC) PF 100 MG/5ML IV SOSY
PREFILLED_SYRINGE | INTRAVENOUS | Status: DC | PRN
  Administered 2020-06-27: 40 mg via INTRATRACHEAL

## 2020-06-27 MED ORDER — PROPOFOL 10 MG/ML IV BOLUS
INTRAVENOUS | Status: DC | PRN
  Administered 2020-06-27: 180 mg via INTRAVENOUS

## 2020-06-27 MED ORDER — 0.9 % SODIUM CHLORIDE (POUR BTL) OPTIME
TOPICAL | Status: DC | PRN
  Administered 2020-06-27: 1000 mL

## 2020-06-27 MED ORDER — LACTATED RINGERS IV SOLN: INTRAVENOUS | Status: DC | PRN

## 2020-06-27 SURGICAL SUPPLY — 24 items
BAG DRN RND TRDRP ANRFLXCHMBR (UROLOGICAL SUPPLIES) ×1
BAG URINE DRAIN 2000ML AR STRL (UROLOGICAL SUPPLIES) ×2 IMPLANT
BAG URO CATCHER STRL LF (MISCELLANEOUS) ×2 IMPLANT
CATH FOLEY 2WAY SLVR  5CC 16FR (CATHETERS)
CATH FOLEY 2WAY SLVR 5CC 16FR (CATHETERS) IMPLANT
CATH INTERMIT  6FR 70CM (CATHETERS) ×2 IMPLANT
GLOVE BIOGEL M 8.0 STRL (GLOVE) ×2 IMPLANT
GOWN STRL REUS W/ TWL LRG LVL3 (GOWN DISPOSABLE) ×1 IMPLANT
GOWN STRL REUS W/ TWL XL LVL3 (GOWN DISPOSABLE) ×1 IMPLANT
GOWN STRL REUS W/TWL LRG LVL3 (GOWN DISPOSABLE) ×2
GOWN STRL REUS W/TWL XL LVL3 (GOWN DISPOSABLE) ×2
GUIDEWIRE ANG ZIPWIRE 038X150 (WIRE) IMPLANT
GUIDEWIRE STR DUAL SENSOR (WIRE) ×2 IMPLANT
KIT TURNOVER KIT B (KITS) ×2 IMPLANT
MANIFOLD NEPTUNE II (INSTRUMENTS) ×1 IMPLANT
NS IRRIG 1000ML POUR BTL (IV SOLUTION) IMPLANT
PACK CYSTO (CUSTOM PROCEDURE TRAY) ×2 IMPLANT
STENT URET 6FRX24 CONTOUR (STENTS) ×1 IMPLANT
STENT URET 6FRX26 CONTOUR (STENTS) IMPLANT
SYPHON OMNI JUG (MISCELLANEOUS) ×1 IMPLANT
SYR 20ML LL LF (SYRINGE) ×1 IMPLANT
TOWEL GREEN STERILE FF (TOWEL DISPOSABLE) ×2 IMPLANT
TUBE CONNECTING 12X1/4 (SUCTIONS) ×1 IMPLANT
WATER STERILE IRR 3000ML UROMA (IV SOLUTION) ×2 IMPLANT

## 2020-06-27 NOTE — Transfer of Care (Signed)
Immediate Anesthesia Transfer of Care Note  Patient: Brooke Lucas  Procedure(s) Performed: CYSTOSCOPY WITH RETROGRADE PYELOGRAM/URETERAL STENT PLACEMENT (Right Ureter)  Patient Location: PACU  Anesthesia Type:General  Level of Consciousness: drowsy  Airway & Oxygen Therapy: Patient Spontanous Breathing and Patient connected to face mask oxygen  Post-op Assessment: Report given to RN and Post -op Vital signs reviewed and stable  Post vital signs: Reviewed and stable  Last Vitals:  Vitals Value Taken Time  BP    Temp    Pulse    Resp    SpO2      Last Pain:  Vitals:   06/27/20 0438  TempSrc: Oral  PainSc:          Complications: No complications documented.

## 2020-06-27 NOTE — Anesthesia Preprocedure Evaluation (Signed)
Anesthesia Evaluation  Patient identified by MRN, date of birth, ID band Patient awake    Reviewed: Allergy & Precautions, NPO status , Patient's Chart, lab work & pertinent test results  Airway Mallampati: II  TM Distance: >3 FB     Dental   Pulmonary former smoker,    breath sounds clear to auscultation       Cardiovascular negative cardio ROS   Rhythm:Regular Rate:Normal     Neuro/Psych Anxiety Depression    GI/Hepatic   Endo/Other    Renal/GU Renal disease     Musculoskeletal   Abdominal   Peds  Hematology   Anesthesia Other Findings   Reproductive/Obstetrics                             Anesthesia Physical Anesthesia Plan  ASA: III  Anesthesia Plan: General   Post-op Pain Management:    Induction: Intravenous  PONV Risk Score and Plan: 3 and Ondansetron, Dexamethasone and Midazolam  Airway Management Planned: LMA  Additional Equipment:   Intra-op Plan:   Post-operative Plan: Extubation in OR  Informed Consent: I have reviewed the patients History and Physical, chart, labs and discussed the procedure including the risks, benefits and alternatives for the proposed anesthesia with the patient or authorized representative who has indicated his/her understanding and acceptance.       Plan Discussed with: CRNA and Anesthesiologist  Anesthesia Plan Comments:         Anesthesia Quick Evaluation

## 2020-06-27 NOTE — H&P (View-Only) (Signed)
Day of Surgery   Subjective/Chief Complaint:  1 - RIGHT Ureteral Stone - 70mm Rt mid-dstal stone on ER CT 5/17 on eval flank pain and malaise. Brooke Lucas is solitary with very mild hydro.  2 - Pyelonephritis - fevers to 102, malaise concerning for early obstructing pyelo at presentation. UA with veyr scant bacteria, no leukocytosis / tachycardia. UCX 5/17 pending. Placed on empiric rocephin.  PMH sig for BTL, sickle trait.  Today "Brooke Lucas" is seen to proceed with RIGHT ureteral stent for renal decompression in setting of ureteral stone with pyelo. She had candybar last night, but has not been NPO >7 hours.    Objective: Vital signs in last 24 hours: Temp:  [98.4 F (36.9 C)-102.1 F (38.9 C)] 98.4 F (36.9 C) (05/18 0438) Pulse Rate:  [86-100] 88 (05/18 0438) Resp:  [13-23] 19 (05/18 0438) BP: (85-125)/(59-83) 111/72 (05/18 0438) SpO2:  [98 %-100 %] 100 % (05/18 0438) Weight:  [68 kg-72.4 kg] 72.4 kg (05/18 0130) Last BM Date: 06/26/20  Intake/Output from previous day: 05/17 0701 - 05/18 0700 In: 1000 [IV Piggyback:1000] Out: -  Intake/Output this shift: Total I/O In: 1000 [IV Piggyback:1000] Out: -   General appearance: alert and sleepy, pleasant.  Eyes: negative Nose: Nares normal. Septum midline. Mucosa normal. No drainage or sinus tenderness. Throat: lips, mucosa, and tongue normal; teeth and gums normal Neck: supple, symmetrical, trachea midline Back: symmetric, no curvature. ROM normal. No CVA tenderness. Resp: Non-labored on RA Cardio: NL rate at present GI: soft, non-tender; bowel sounds normal; no masses,  no organomegaly Extremities: extremities normal, atraumatic, no cyanosis or edema Skin: Skin color, texture, turgor normal. No rashes or lesions Lymph nodes: Cervical, supraclavicular, and axillary nodes normal. Neurologic: Grossly normal  Lab Results:  Recent Labs    06/26/20 1836  WBC 9.0  HGB 13.2  HCT 38.8  PLT 234   BMET Recent Labs    06/26/20 1836   NA 135  K 4.0  CL 103  CO2 26  GLUCOSE 94  BUN 6  CREATININE 0.91  CALCIUM 9.0   PT/INR No results for input(s): LABPROT, INR in the last 72 hours. ABG No results for input(s): PHART, HCO3 in the last 72 hours.  Invalid input(s): PCO2, PO2  Studies/Results: CT ABDOMEN PELVIS WO CONTRAST  Result Date: 06/26/2020 CLINICAL DATA:  Right-sided flank pain, fevers EXAM: CT ABDOMEN AND PELVIS WITHOUT CONTRAST TECHNIQUE: Multidetector CT imaging of the abdomen and pelvis was performed following the standard protocol without IV contrast. COMPARISON:  09/08/2010 FINDINGS: Lower chest: No acute abnormality. Hepatobiliary: No focal liver abnormality is seen. No gallstones, gallbladder wall thickening, or biliary dilatation. Pancreas: Unremarkable. No pancreatic ductal dilatation or surrounding inflammatory changes. Spleen: Normal in size without focal abnormality. Adrenals/Urinary Tract: Adrenal glands are within normal limits. Kidneys demonstrate no renal calculi. The collecting system of the right kidney is somewhat prominent with some mild stranding surrounding the collecting system and proximal ureter. There is a 6 mm calcification identified in the expected region of the mid to distal right ureter which was not seen on the prior exam and may represent a ureteral stone. This is best visualized on coronal image number 71 of series 6. Bladder is well distended. No obstructive changes on the left are noted. Stomach/Bowel: No obstructive or inflammatory changes of the colon are seen. The appendix is within normal limits. No small bowel or gastric abnormality is noted. Vascular/Lymphatic: No significant vascular findings are present. No enlarged abdominal or pelvic lymph nodes. Reproductive: Uterus  and bilateral adnexa are unremarkable. Other: No abdominal wall hernia or abnormality. No abdominopelvic ascites. Musculoskeletal: No acute or significant osseous findings. IMPRESSION: Mild fullness of the right  renal collecting system with right perinephric stranding and findings highly suggestive of a mid to distal right ureteral stone measuring 6 mm. Normal-appearing appendix. No other focal abnormality is noted. Electronically Signed   By: Mark  Lukens M.D.   On: 06/26/2020 21:03   DG Chest Port 1 View  Result Date: 06/26/2020 CLINICAL DATA:  Questionable sepsis, 10/10 right lower quadrant pain radiating to right flank EXAM: PORTABLE CHEST 1 VIEW COMPARISON:  01/13/2018 FINDINGS: No consolidation, features of edema, pneumothorax, or effusion. Pulmonary vascularity is normally distributed. The cardiomediastinal contours are unremarkable. No acute osseous or soft tissue abnormality. Telemetry leads overlie the chest. IMPRESSION: No acute cardiopulmonary abnormality. Electronically Signed   By: Price  DeHay M.D.   On: 06/26/2020 23:21    Anti-infectives: Anti-infectives (From admission, onward)   Start     Dose/Rate Route Frequency Ordered Stop   06/26/20 2030  cefTRIAXone (ROCEPHIN) 2 g in sodium chloride 0.9 % 100 mL IVPB        2 g 200 mL/hr over 30 Minutes Intravenous  Once 06/26/20 2028 06/26/20 2209      Assessment/Plan: Proceed as planned with cysto, RIGHT retrograde, ureteral stent placement for goal of renal decompression. Risks, benefits, alternatives, expected peri-op course discussed as well as reason for staged approach with stent today, clear infectious parameters, then definitive stone management in elective setting as safest way to proceed.   Brooke Lucas 06/27/2020 

## 2020-06-27 NOTE — Progress Notes (Signed)
Patient returned to unit. No complaint of pain or discomfort.

## 2020-06-27 NOTE — Progress Notes (Signed)
Day of Surgery   Subjective/Chief Complaint:  1 - RIGHT Ureteral Stone - 70mm Rt mid-dstal stone on ER CT 5/17 on eval flank pain and malaise. Brooke Lucas is solitary with very mild hydro.  2 - Pyelonephritis - fevers to 102, malaise concerning for early obstructing pyelo at presentation. UA with veyr scant bacteria, no leukocytosis / tachycardia. UCX 5/17 pending. Placed on empiric rocephin.  PMH sig for BTL, sickle trait.  Today "Brooke Lucas" is seen to proceed with RIGHT ureteral stent for renal decompression in setting of ureteral stone with pyelo. She had candybar last night, but has not been NPO >7 hours.    Objective: Vital signs in last 24 hours: Temp:  [98.4 F (36.9 C)-102.1 F (38.9 C)] 98.4 F (36.9 C) (05/18 0438) Pulse Rate:  [86-100] 88 (05/18 0438) Resp:  [13-23] 19 (05/18 0438) BP: (85-125)/(59-83) 111/72 (05/18 0438) SpO2:  [98 %-100 %] 100 % (05/18 0438) Weight:  [68 kg-72.4 kg] 72.4 kg (05/18 0130) Last BM Date: 06/26/20  Intake/Output from previous day: 05/17 0701 - 05/18 0700 In: 1000 [IV Piggyback:1000] Out: -  Intake/Output this shift: Total I/O In: 1000 [IV Piggyback:1000] Out: -   General appearance: alert and sleepy, pleasant.  Eyes: negative Nose: Nares normal. Septum midline. Mucosa normal. No drainage or sinus tenderness. Throat: lips, mucosa, and tongue normal; teeth and gums normal Neck: supple, symmetrical, trachea midline Back: symmetric, no curvature. ROM normal. No CVA tenderness. Resp: Non-labored on RA Cardio: NL rate at present GI: soft, non-tender; bowel sounds normal; no masses,  no organomegaly Extremities: extremities normal, atraumatic, no cyanosis or edema Skin: Skin color, texture, turgor normal. No rashes or lesions Lymph nodes: Cervical, supraclavicular, and axillary nodes normal. Neurologic: Grossly normal  Lab Results:  Recent Labs    06/26/20 1836  WBC 9.0  HGB 13.2  HCT 38.8  PLT 234   BMET Recent Labs    06/26/20 1836   NA 135  K 4.0  CL 103  CO2 26  GLUCOSE 94  BUN 6  CREATININE 0.91  CALCIUM 9.0   PT/INR No results for input(s): LABPROT, INR in the last 72 hours. ABG No results for input(s): PHART, HCO3 in the last 72 hours.  Invalid input(s): PCO2, PO2  Studies/Results: CT ABDOMEN PELVIS WO CONTRAST  Result Date: 06/26/2020 CLINICAL DATA:  Right-sided flank pain, fevers EXAM: CT ABDOMEN AND PELVIS WITHOUT CONTRAST TECHNIQUE: Multidetector CT imaging of the abdomen and pelvis was performed following the standard protocol without IV contrast. COMPARISON:  09/08/2010 FINDINGS: Lower chest: No acute abnormality. Hepatobiliary: No focal liver abnormality is seen. No gallstones, gallbladder wall thickening, or biliary dilatation. Pancreas: Unremarkable. No pancreatic ductal dilatation or surrounding inflammatory changes. Spleen: Normal in size without focal abnormality. Adrenals/Urinary Tract: Adrenal glands are within normal limits. Kidneys demonstrate no renal calculi. The collecting system of the right kidney is somewhat prominent with some mild stranding surrounding the collecting system and proximal ureter. There is a 6 mm calcification identified in the expected region of the mid to distal right ureter which was not seen on the prior exam and may represent a ureteral stone. This is best visualized on coronal image number 71 of series 6. Bladder is well distended. No obstructive changes on the left are noted. Stomach/Bowel: No obstructive or inflammatory changes of the colon are seen. The appendix is within normal limits. No small bowel or gastric abnormality is noted. Vascular/Lymphatic: No significant vascular findings are present. No enlarged abdominal or pelvic lymph nodes. Reproductive: Uterus  and bilateral adnexa are unremarkable. Other: No abdominal wall hernia or abnormality. No abdominopelvic ascites. Musculoskeletal: No acute or significant osseous findings. IMPRESSION: Mild fullness of the right  renal collecting system with right perinephric stranding and findings highly suggestive of a mid to distal right ureteral stone measuring 6 mm. Normal-appearing appendix. No other focal abnormality is noted. Electronically Signed   By: Alcide Clever M.D.   On: 06/26/2020 21:03   DG Chest Port 1 View  Result Date: 06/26/2020 CLINICAL DATA:  Questionable sepsis, 10/10 right lower quadrant pain radiating to right flank EXAM: PORTABLE CHEST 1 VIEW COMPARISON:  01/13/2018 FINDINGS: No consolidation, features of edema, pneumothorax, or effusion. Pulmonary vascularity is normally distributed. The cardiomediastinal contours are unremarkable. No acute osseous or soft tissue abnormality. Telemetry leads overlie the chest. IMPRESSION: No acute cardiopulmonary abnormality. Electronically Signed   By: Kreg Shropshire M.D.   On: 06/26/2020 23:21    Anti-infectives: Anti-infectives (From admission, onward)   Start     Dose/Rate Route Frequency Ordered Stop   06/26/20 2030  cefTRIAXone (ROCEPHIN) 2 g in sodium chloride 0.9 % 100 mL IVPB        2 g 200 mL/hr over 30 Minutes Intravenous  Once 06/26/20 2028 06/26/20 2209      Assessment/Plan: Proceed as planned with cysto, RIGHT retrograde, ureteral stent placement for goal of renal decompression. Risks, benefits, alternatives, expected peri-op course discussed as well as reason for staged approach with stent today, clear infectious parameters, then definitive stone management in elective setting as safest way to proceed.   [name] 06/27/2020

## 2020-06-27 NOTE — Anesthesia Postprocedure Evaluation (Signed)
Anesthesia Post Note  Patient: Brooke Lucas  Procedure(s) Performed: CYSTOSCOPY WITH RETROGRADE PYELOGRAM/URETERAL STENT PLACEMENT (Right Ureter)     Patient location during evaluation: PACU Anesthesia Type: General Level of consciousness: awake Pain management: pain level controlled Vital Signs Assessment: post-procedure vital signs reviewed and stable Respiratory status: spontaneous breathing Cardiovascular status: stable Postop Assessment: no apparent nausea or vomiting Anesthetic complications: no   No complications documented.  Last Vitals:  Vitals:   06/27/20 0550 06/27/20 0605  BP: 119/77 (!) 119/94  Pulse: 82 93  Resp: 18 20  Temp: 36.9 C   SpO2: 100% 100%    Last Pain:  Vitals:   06/27/20 0550  TempSrc:   PainSc: 7                  Benito Lemmerman

## 2020-06-27 NOTE — Hospital Course (Addendum)
Brooke Lucas is a 35 y.o. female who presented with right-sided abdominal pain 2/2 56mm obstructing ureteral stone. PMH is significant for anxiety, depression, sickle cell trait, tobacco use. Below is her brief hospital course.   20mm Obstructing ureteral stone Febrile in ED to 102.1, resolved with antipyretics. CBC without leukocytosis. Electrolytes and kidney function stable. Lactic acid 2>0.9. VSS. UA with moderate hemoglobin, large leukoocytes, >50 WBC, rare bacteria and mucus present. CT scan with 6 mm stone identified in the mid to distal right ureter with perinephric stranding. Started on CTX. Urology consulted and performed urgent ureteral stent. Pain controlled with Dilaudid PRN and transitioned to ibuprofen. Urine culture and blood culture showed no growth by time of discharge. Patient discharged on Keflex for additional 7 days and Naproxen for pain.   Alcohol Abuse Patient reported significant alcohol intake socially on the weekends, sometimes drinking up to 1/2 gallon of Tequila. CIWA's monitored during hospitalization. No ativan was required.   Follow up recommendations Follow up with Urology for definitive stone procedure  Encourage decreasing alcohol intake, provide resources.  Continue to encourage adequate hydration.

## 2020-06-27 NOTE — Progress Notes (Signed)
Family Medicine Teaching Service Daily Progress Note Intern Pager: [phone]  Patient name: Brooke Lucas Medical record number: [id number] Date of birth: 05/06/1985 Age: 35 y.o. Gender: female  Primary Care Provider: Patient, No Pcp Per (Inactive) Consultants: Urology Code Status: Full   Pt Overview and Major Events to Date:  5/17 Admitted 5/15 Stent placement   Assessment and Plan: Brooke Lucas is a 35 y.o. female presenting with right-sided abdominal pain 2/2 obstructing ureteral stone s/p stent placement. PMH is significant for anxiety, depression, sickle cell trait, tobacco use.   Right flank pain 2/2 33mm obstructing ureteral stone with hydronephrosis  S/p ureteral stent placement  CT scan with 6 mm stone identified in the mid to distal right ureter with perinephric stranding.Stent placed this am. VSS patient doing well this morning, sleeping comfortably when I came. Did state she was in some pain.  -Urology following, appreciate care and recommendations  - definitive stone management in elective setting  -Continue Tamsulosin daily -mIVF LR 100 mL/h can d/c later today if tolerating po well  -F/u Urine culture -F/u blood cultures  -Continue CTX (5/17-) -Pain control with Ibuprofen 600mg  q6 prn   Anxiety  Depression Listed in history. Per chart review, appears that patient has lost a son. Not on any medications currently.  -Follow up outpatient  Sickle Cell Trait CBC WNL, Hgb 13.2.  Alcohol Abuse Patient endorses drinking 1/2 pint to 1/2 a gallon of tequila on the weekends. Last drink 5/14. Denies history of alcohol withdrawals or seizures.  -Encourage cessation -CIWA without ativan  -MVI, Thiamine, folic acid  FEN/GI: NPO Prophylaxis: SCD's  Disposition: Med-surg   Subjective: Patient sleeping when I came into the room. States she is in some pain but is doing ok. Denies any concerns or complaints.   Objective: Temp:  [97.8 F (36.6 C)-102.1 F (38.9  C)] 98.2 F (36.8 C) (05/18 0645) Pulse Rate:  [82-100] 85 (05/18 0645) Resp:  [13-23] 16 (05/18 0620) BP: (85-125)/(59-94) 114/70 (05/18 0645) SpO2:  [98 %-100 %] 100 % (05/18 0645) Weight:  [68 kg-72.4 kg] 72.4 kg (05/18 0130) Physical Exam: General: sleeping, NAD Cardiovascular: RRR no murmurs  Respiratory: CTAB normal WOB Abdomen: soft, non distended Extremities: warm, dry, no LE edema   Laboratory: Recent Labs  Lab 06/26/20 1836  WBC 9.0  HGB 13.2  HCT 38.8  PLT 234   Recent Labs  Lab 06/26/20 1836  NA 135  K 4.0  CL 103  CO2 26  BUN 6  CREATININE 0.91  CALCIUM 9.0  PROT 6.6  BILITOT 1.1  ALKPHOS 50  ALT 9  AST 14*  GLUCOSE 94    Imaging/Diagnostic Tests:  CT ABDOMEN PELVIS WO CONTRAST  Result Date: 06/26/2020 CLINICAL DATA:  Right-sided flank pain, fevers EXAM: CT ABDOMEN AND PELVIS WITHOUT CONTRAST TECHNIQUE: Multidetector CT imaging of the abdomen and pelvis was performed following the standard protocol without IV contrast. COMPARISON:  09/08/2010 FINDINGS: Lower chest: No acute abnormality. Hepatobiliary: No focal liver abnormality is seen. No gallstones, gallbladder wall thickening, or biliary dilatation. Pancreas: Unremarkable. No pancreatic ductal dilatation or surrounding inflammatory changes. Spleen: Normal in size without focal abnormality. Adrenals/Urinary Tract: Adrenal glands are within normal limits. Kidneys demonstrate no renal calculi. The collecting system of the right kidney is somewhat prominent with some mild stranding surrounding the collecting system and proximal ureter. There is a 6 mm calcification identified in the expected region of the mid to distal right ureter which was not seen on the prior  exam and may represent a ureteral stone. This is best visualized on coronal image number 71 of series 6. Bladder is well distended. No obstructive changes on the left are noted. Stomach/Bowel: No obstructive or inflammatory changes of the colon are  seen. The appendix is within normal limits. No small bowel or gastric abnormality is noted. Vascular/Lymphatic: No significant vascular findings are present. No enlarged abdominal or pelvic lymph nodes. Reproductive: Uterus and bilateral adnexa are unremarkable. Other: No abdominal wall hernia or abnormality. No abdominopelvic ascites. Musculoskeletal: No acute or significant osseous findings. IMPRESSION: Mild fullness of the right renal collecting system with right perinephric stranding and findings highly suggestive of a mid to distal right ureteral stone measuring 6 mm. Normal-appearing appendix. No other focal abnormality is noted. Electronically Signed   By: Alcide Clever M.D.   On: 06/26/2020 21:03   DG Chest Port 1 View  Result Date: 06/26/2020 CLINICAL DATA:  Questionable sepsis, 10/10 right lower quadrant pain radiating to right flank EXAM: PORTABLE CHEST 1 VIEW COMPARISON:  01/13/2018 FINDINGS: No consolidation, features of edema, pneumothorax, or effusion. Pulmonary vascularity is normally distributed. The cardiomediastinal contours are unremarkable. No acute osseous or soft tissue abnormality. Telemetry leads overlie the chest. IMPRESSION: No acute cardiopulmonary abnormality. Electronically Signed   By: Kreg Shropshire M.D.   On: 06/26/2020 23:21    Cora Collum, DO 06/27/2020, 7:17 AM PGY-1, [facility] FPTS Intern pager: [phone], text pages welcome

## 2020-06-27 NOTE — Brief Op Note (Signed)
06/26/2020 - 06/27/2020  5:33 AM  PATIENT:  Brooke Lucas  34 y.o. female  PRE-OPERATIVE DIAGNOSIS:  urolithiasis  POST-OPERATIVE DIAGNOSIS:  urolithiasis  PROCEDURE:  Procedure(s): CYSTOSCOPY WITH RETROGRADE PYELOGRAM/URETERAL STENT PLACEMENT (Right)  SURGEON:  Surgeon(s) and Role:    * [name], MD - Primary  PHYSICIAN ASSISTANT:   ASSISTANTS: none   ANESTHESIA:   general  EBL:  minimal   BLOOD ADMINISTERED:none  DRAINS: none   LOCAL MEDICATIONS USED:  NONE  SPECIMEN:  No Specimen  DISPOSITION OF SPECIMEN:  N/A  COUNTS:  YES  TOURNIQUET:  * No tourniquets in log *  DICTATION: .Other Dictation: Dictation Number [id number]  PLAN OF CARE: Admit to inpatient   PATIENT DISPOSITION:  PACU - hemodynamically stable.   Delay start of Pharmacological VTE agent (>24hrs) due to surgical blood loss or risk of bleeding: yes

## 2020-06-27 NOTE — Progress Notes (Signed)
Patient off the unit for procedure.

## 2020-06-27 NOTE — Anesthesia Procedure Notes (Signed)
Procedure Name: LMA Insertion Date/Time: 06/27/2020 5:30 AM Performed by: Claudina Lick, CRNA Pre-anesthesia Checklist: Patient identified, Emergency Drugs available, Suction available, Patient being monitored and Timeout performed Patient Re-evaluated:Patient Re-evaluated prior to induction Oxygen Delivery Method: Circle system utilized Preoxygenation: Pre-oxygenation with 100% oxygen Induction Type: IV induction Ventilation: Mask ventilation without difficulty LMA: LMA inserted LMA Size: 4.0 Number of attempts: 1 Placement Confirmation: positive ETCO2 and breath sounds checked- equal and bilateral Tube secured with: Tape Dental Injury: Teeth and Oropharynx as per pre-operative assessment

## 2020-06-27 NOTE — Progress Notes (Signed)
Patient admitted to 6n07.

## 2020-06-27 NOTE — Op Note (Signed)
NAMEGRACELYNNE, BENEDICT MEDICAL RECORD NO: [id number] ACCOUNT NO: [id number] DATE OF BIRTH: 12/08/1985 FACILITY: [facility] LOCATION: [facility] PHYSICIAN: [name], MD  Operative Report   DATE OF PROCEDURE: 06/27/2020  PREOPERATIVE DIAGNOSIS:  Right ureteral stone with fevers.  PROCEDURE PERFORMED:   1.  Cystoscopy, right retrograde pyelogram interpretation. 2.  Insertion of right ureteral stent, 6 x 24 Contour, no tether.  ESTIMATED BLOOD LOSS:  Nil.  COMPLICATION:  None.  SPECIMENS:  None.  FINDINGS: 1.  Very mild right hydronephrosis. 2.  Successful placement of right ureteral stent proximal end in the renal pelvis, distal end in the urinary bladder.  INDICATIONS:  The patient is a pleasant 35 year old lady with history of sickle cell trait, who was found on workup to have a colicky flank pain and malaise to have a right mid to distal ureteral stone.  She had a somewhat equivocal presentation for  early pyelonephritis.  She did have fevers; however, no leukocytosis, no tachycardia and urinalysis only had very scant bacteria; however, erring on the side of caution it was felt that stenting for renal decompression to allow for clearance of  infectious parameters before definitive stone management would be most prudent.  She had just eaten a large candy bar at presentation to the ER and was not n.p.o.; therefore, given her clinical presentation, it was felt to be safest to wait a full 7  hours before stenting and she now presents for this.  Informed consent was signed and placed in the medical record.  PROCEDURE IN DETAIL:  The patient being verified, procedure being right ureteral stent placement was confirmed.  Procedure timeout was performed.  Intravenous antibiotics was administered.  General LMA anesthesia induced.  The patient was placed into a  low lithotomy position.  Sterile field was created.  Prepped and draped the patient's vagina, introitus and proximal thighs using iodine.   Cystourethroscopy was performed using a rigid cystoscope with offset lens.  Inspection of the urinary bladder  revealed no diverticula, calcifications, papillary lesions.  The right ureteral orifice was cannulated with a 6-French renal catheter and right retrograde pyelogram was obtained.  Right retrograde pyelogram demonstrated a single right ureter, single system right kidney.  There is a questionable filling defect in the mid distal ureter with a very, very mild hydronephrosis above this.  A 0.038 sensor wire was advanced to the level  of the mid kidney and a new 6 x 24 Contour type stent was placed using cystoscopic and fluoroscopic guidance.  Good proximal and distal planes were noted.  As the patient is not toxic it was not felt that Foley catheterization was warranted.  Bladder was  emptied per cystoscope.  Procedure was then terminated.  The patient tolerated the procedure well, no immediate complications.  The patient was taken to postanesthesia care in stable condition.  Plan for continued admission, verify fever free for 24  hours before discharge home.   PUS D: 06/27/2020 5:37:13 am T: 06/27/2020 5:28:00 pm  JOB: [id number]/ [id number]

## 2020-06-28 ENCOUNTER — Encounter (HOSPITAL_COMMUNITY): Payer: Self-pay | Admitting: Urology

## 2020-06-28 ENCOUNTER — Other Ambulatory Visit (HOSPITAL_COMMUNITY): Payer: Self-pay

## 2020-06-28 MED ORDER — TAMSULOSIN HCL 0.4 MG PO CAPS
0.4000 mg | ORAL_CAPSULE | Freq: Every day | ORAL | 0 refills | Status: DC
  Filled 2020-06-28: qty 30, 30d supply, fill #0

## 2020-06-28 MED ORDER — NAPROXEN 500 MG PO TABS
500.0000 mg | ORAL_TABLET | Freq: Two times a day (BID) | ORAL | 0 refills | Status: AC
  Filled 2020-06-28: qty 20, 10d supply, fill #0

## 2020-06-28 MED ORDER — CEPHALEXIN 500 MG PO CAPS
500.0000 mg | ORAL_CAPSULE | Freq: Four times a day (QID) | ORAL | 0 refills | Status: AC
  Filled 2020-06-28: qty 32, 8d supply, fill #0

## 2020-06-28 NOTE — Discharge Summary (Signed)
Family Medicine Teaching [facility] Discharge Summary  Patient name: Brooke Lucas Medical record number: [id number] Date of birth: [birth year] Age: 35 y.o. Gender: female Date of Admission: 06/26/2020  Date of Discharge: 06/28/20 Admitting Physician: Doreene Eland, MD  Primary Care Provider: Patient, No Pcp Per (Inactive) Consultants: Urology   Indication for Hospitalization: Ureteral stone   Discharge Diagnoses/Problem List:   Active Problems:   Kidney stone  Disposition: Home  Discharge Condition: Stable  Discharge Exam:  Temp:  [98.4 F (36.9 C)-99.6 F (37.6 C)] 99.6 F (37.6 C) (05/18 2004) Pulse Rate:  [87-101] 101 (05/18 2004) Resp:  [16-18] 18 (05/18 2004) BP: (119-125)/(54-69) 125/69 (05/18 2004) SpO2:  [99 %-100 %] 99 % (05/18 2004) Physical Exam: General: alert, pleasant, NAD  Cardiovascular: RRR no murmurs Respiratory: CTAB normal WOB Abdomen: soft, non distended Extremities: warm, dry. No LE edema   Brief Hospital Course:   Brooke Lucas is a 35 y.o. female who presented with right-sided abdominal pain 2/2 61mm obstructing ureteral stone. PMH is significant for anxiety, depression, sickle cell trait, tobacco use. Below is her brief hospital course.   30mm Obstructing ureteral stone Febrile in ED to 102.1, resolved with antipyretics. CBC without leukocytosis. Electrolytes and kidney function stable. Lactic acid 2>0.9. VSS. UA with moderate hemoglobin, large leukoocytes, >50 WBC, rare bacteria and mucus present. CT scan with 6 mm stone identified in the mid to distal right ureter with perinephric stranding. Started on CTX. Urology consulted and performed urgent ureteral stent. Pain controlled with Dilaudid PRN and transitioned to ibuprofen. Urine culture and blood culture showed no growth by time of discharge. Patient discharged on Keflex for additional 8 days and Naproxen for pain.   Alcohol Use  Patient reported significant alcohol intake socially  on the weekends, sometimes drinking up to 1/2 gallon of Tequila. CIWA's monitored during hospitalization. No ativan was required.   Issues for Follow Up:  - Follow up with Urology for definitive stone procedure  - Encourage decreasing alcohol intake, provide resources.  - Continue to encourage adequate hydration.   Significant Procedures: Ureteral stent placement   Significant Labs and Imaging:  Recent Labs  Lab 06/26/20 1836  WBC 9.0  HGB 13.2  HCT 38.8  PLT 234   Recent Labs  Lab 06/26/20 1836  NA 135  K 4.0  CL 103  CO2 26  GLUCOSE 94  BUN 6  CREATININE 0.91  CALCIUM 9.0  ALKPHOS 50  AST 14*  ALT 9  ALBUMIN 3.7    CT ABDOMEN PELVIS WO CONTRAST  Result Date: 06/26/2020 CLINICAL DATA:  Right-sided flank pain, fevers EXAM: CT ABDOMEN AND PELVIS WITHOUT CONTRAST TECHNIQUE: Multidetector CT imaging of the abdomen and pelvis was performed following the standard protocol without IV contrast. COMPARISON:  09/08/2010 FINDINGS: Lower chest: No acute abnormality. Hepatobiliary: No focal liver abnormality is seen. No gallstones, gallbladder wall thickening, or biliary dilatation. Pancreas: Unremarkable. No pancreatic ductal dilatation or surrounding inflammatory changes. Spleen: Normal in size without focal abnormality. Adrenals/Urinary Tract: Adrenal glands are within normal limits. Kidneys demonstrate no renal calculi. The collecting system of the right kidney is somewhat prominent with some mild stranding surrounding the collecting system and proximal ureter. There is a 6 mm calcification identified in the expected region of the mid to distal right ureter which was not seen on the prior exam and may represent a ureteral stone. This is best visualized on coronal image number 71 of series 6. Bladder is well distended. No obstructive changes  on the left are noted. Stomach/Bowel: No obstructive or inflammatory changes of the colon are seen. The appendix is within normal limits. No small  bowel or gastric abnormality is noted. Vascular/Lymphatic: No significant vascular findings are present. No enlarged abdominal or pelvic lymph nodes. Reproductive: Uterus and bilateral adnexa are unremarkable. Other: No abdominal wall hernia or abnormality. No abdominopelvic ascites. Musculoskeletal: No acute or significant osseous findings. IMPRESSION: Mild fullness of the right renal collecting system with right perinephric stranding and findings highly suggestive of a mid to distal right ureteral stone measuring 6 mm. Normal-appearing appendix. No other focal abnormality is noted. Electronically Signed   By: Alcide Clever M.D.   On: 06/26/2020 21:03   DG Retrograde Pyelogram  Result Date: 06/27/2020 CLINICAL DATA:  Cystoscopy with retrograde pyelogram and ureteral stent placement EXAM: RETROGRADE PYELOGRAM COMPARISON:  None. FINDINGS: Fluoro time: 10 seconds Three intraoperative fluoroscopic images were provided for interpretation. The submitted images demonstrate guidewire extending into the right ureter and upper pole of the right kidney, followed by ureteral stent placement extending from the right renal pelvis to the expected region of the bladder. IMPRESSION: Intraoperative fluoroscopic images of retrograde pyelography as above. Electronically Signed   By: Acquanetta Belling M.D.   On: 06/27/2020 09:39   DG Chest Port 1 View  Result Date: 06/26/2020 CLINICAL DATA:  Questionable sepsis, 10/10 right lower quadrant pain radiating to right flank EXAM: PORTABLE CHEST 1 VIEW COMPARISON:  01/13/2018 FINDINGS: No consolidation, features of edema, pneumothorax, or effusion. Pulmonary vascularity is normally distributed. The cardiomediastinal contours are unremarkable. No acute osseous or soft tissue abnormality. Telemetry leads overlie the chest. IMPRESSION: No acute cardiopulmonary abnormality. Electronically Signed   By: Kreg Shropshire M.D.   On: 06/26/2020 23:21   Results/Tests Pending at Time of Discharge:  None  Discharge Medications:  Allergies as of 06/28/2020      Reactions   Amoxicillin Anaphylaxis   Has patient had a PCN reaction causing immediate rash, facial/tongue/throat swelling, SOB or lightheadedness with hypotension: Yes Has patient had a PCN reaction causing severe rash involving mucus membranes or skin necrosis: Yes Has patient had a PCN reaction that required hospitalization No Has patient had a PCN reaction occurring within the last 10 years: No If all of the above answers are "NO", then may proceed with Cephalosporin use.   Penicillins Anaphylaxis   Has patient had a PCN reaction causing immediate rash, facial/tongue/throat swelling, SOB or lightheadedness with hypotension: Yes Has patient had a PCN reaction causing severe rash involving mucus membranes or skin necrosis: Yes Has patient had a PCN reaction that required hospitalization No Has patient had a PCN reaction occurring within the last 10 years: No If all of the above answers are "NO", then may proceed with Cephalosporin use.   Benadryl [diphenhydramine] Anxiety   Panic, feels like she cant breathing      Medication List    TAKE these medications   cephALEXin 500 MG capsule Commonly known as: KEFLEX Take 1 capsule (500 mg total) by mouth 4 (four) times daily for 8 days.   naproxen 500 MG EC tablet Commonly known as: EC NAPROSYN Take 1 tablet (500 mg total) by mouth 2 (two) times daily with a meal for 10 days.   tamsulosin 0.4 MG Caps capsule Commonly known as: FLOMAX Take 1 capsule (0.4 mg total) by mouth daily. Start taking on: Jun 29, 2020       Discharge Instructions: Please refer to Patient Instructions section of EMR for full  details.  Patient was counseled important signs and symptoms that should prompt return to medical care, changes in medications, dietary instructions, activity restrictions, and follow up appointments.   Follow-Up Appointments:  Follow-up Information    [facility]. Go on 07/02/2020.   Why: Appt 10:50 am on 5/23. Please arrive 15 min early. If you need to reschedule call [phone] Contact information: [address] [place] [address] [phone]       [facility] .   Contact information: [address] [place] [address] [phone]              Cora Collum, DO 06/28/2020, 1:30 PM PGY-1, [facility]

## 2020-06-28 NOTE — Discharge Instructions (Signed)
Dear Brooke Lucas,   Thank you so much for allowing [place] to be part of your care!  You were admitted to [facility] for a kidney stone and had a stent placed. Glad to see you are feeling better!   Please take your antibiotic Keflex 4 times a day for the next   POST-HOSPITAL & CARE INSTRUCTIONS 1. Follow up with Urology outpatient  2. Please let PCP/Specialists know of any changes that were made.  3. Please see medications section of this packet for any medication changes.   DOCTOR'S APPOINTMENT & FOLLOW UP CARE INSTRUCTIONS  No future appointments.  Take care and be well!  Family Medicine Teaching Service Inpatient Team [place]  [facility]  [address] [place], [place] [address] [phone]

## 2020-06-28 NOTE — Progress Notes (Signed)
1 Day Post-Op   Subjective/Chief Complaint:  1 - RIGHT Ureteral Stone - 69mm Rt mid-dstal stone on ER CT 5/17 on eval flank pain and malaise. [name] is solitary with very mild hydro.  2 - Pyelonephritis - fevers to 102, malaise concerning for early obstructing pyelo at presentation. UA with veyr scant bacteria, no leukocytosis / tachycardia. UCX 5/17 pending. Placed on empiric rocephin.  PMH sig for BTL, sickle trait.  Today "Brooke Lucas" is stable. No high grade fevers last 24 hours, UCX still pending. She feels MUCH better.    Objective: Vital signs in last 24 hours: Temp:  [98.4 F (36.9 C)-99.7 F (37.6 C)] 99.6 F (37.6 C) (05/18 2004) Pulse Rate:  [84-101] 101 (05/18 2004) Resp:  [16-18] 18 (05/18 2004) BP: (113-125)/(54-76) 125/69 (05/18 2004) SpO2:  [99 %-100 %] 99 % (05/18 2004) Last BM Date: 06/26/20  Intake/Output from previous day: 05/18 0701 - 05/19 0700 In: 2626.9 [P.O.:1410; I.V.:1216.9] Out: -  Intake/Output this shift: No intake/output data recorded.  NAD, AOx3, Very pleasant. Mother at bedside as well Non-labored breathign on RA Nl heart rate No CVAT No foley No c/c/e  Lab Results:  Recent Labs    06/26/20 1836  WBC 9.0  HGB 13.2  HCT 38.8  PLT 234   BMET Recent Labs    06/26/20 1836  NA 135  K 4.0  CL 103  CO2 26  GLUCOSE 94  BUN 6  CREATININE 0.91  CALCIUM 9.0   PT/INR No results for input(s): LABPROT, INR in the last 72 hours. ABG No results for input(s): PHART, HCO3 in the last 72 hours.  Invalid input(s): PCO2, PO2  Studies/Results: CT ABDOMEN PELVIS WO CONTRAST  Result Date: 06/26/2020 CLINICAL DATA:  Right-sided flank pain, fevers EXAM: CT ABDOMEN AND PELVIS WITHOUT CONTRAST TECHNIQUE: Multidetector CT imaging of the abdomen and pelvis was performed following the standard protocol without IV contrast. COMPARISON:  09/08/2010 FINDINGS: Lower chest: No acute abnormality. Hepatobiliary: No focal liver abnormality is seen. No  gallstones, gallbladder wall thickening, or biliary dilatation. Pancreas: Unremarkable. No pancreatic ductal dilatation or surrounding inflammatory changes. Spleen: Normal in size without focal abnormality. Adrenals/Urinary Tract: Adrenal glands are within normal limits. Kidneys demonstrate no renal calculi. The collecting system of the right kidney is somewhat prominent with some mild stranding surrounding the collecting system and proximal ureter. There is a 6 mm calcification identified in the expected region of the mid to distal right ureter which was not seen on the prior exam and may represent a ureteral stone. This is best visualized on coronal image number 71 of series 6. Bladder is well distended. No obstructive changes on the left are noted. Stomach/Bowel: No obstructive or inflammatory changes of the colon are seen. The appendix is within normal limits. No small bowel or gastric abnormality is noted. Vascular/Lymphatic: No significant vascular findings are present. No enlarged abdominal or pelvic lymph nodes. Reproductive: Uterus and bilateral adnexa are unremarkable. Other: No abdominal wall hernia or abnormality. No abdominopelvic ascites. Musculoskeletal: No acute or significant osseous findings. IMPRESSION: Mild fullness of the right renal collecting system with right perinephric stranding and findings highly suggestive of a mid to distal right ureteral stone measuring 6 mm. Normal-appearing appendix. No other focal abnormality is noted. Electronically Signed   By: Alcide Clever M.D.   On: 06/26/2020 21:03   DG Retrograde Pyelogram  Result Date: 06/27/2020 CLINICAL DATA:  Cystoscopy with retrograde pyelogram and ureteral stent placement EXAM: RETROGRADE PYELOGRAM COMPARISON:  None. FINDINGS: Fluoro  time: 10 seconds Three intraoperative fluoroscopic images were provided for interpretation. The submitted images demonstrate guidewire extending into the right ureter and upper pole of the right kidney,  followed by ureteral stent placement extending from the right renal pelvis to the expected region of the bladder. IMPRESSION: Intraoperative fluoroscopic images of retrograde pyelography as above. Electronically Signed   By: Acquanetta Belling M.D.   On: 06/27/2020 09:39   DG Chest Port 1 View  Result Date: 06/26/2020 CLINICAL DATA:  Questionable sepsis, 10/10 right lower quadrant pain radiating to right flank EXAM: PORTABLE CHEST 1 VIEW COMPARISON:  01/13/2018 FINDINGS: No consolidation, features of edema, pneumothorax, or effusion. Pulmonary vascularity is normally distributed. The cardiomediastinal contours are unremarkable. No acute osseous or soft tissue abnormality. Telemetry leads overlie the chest. IMPRESSION: No acute cardiopulmonary abnormality. Electronically Signed   By: Kreg Shropshire M.D.   On: 06/26/2020 23:21    Anti-infectives: Anti-infectives (From admission, onward)   Start     Dose/Rate Route Frequency Ordered Stop   06/27/20 2000  cefTRIAXone (ROCEPHIN) 2 g in sodium chloride 0.9 % 100 mL IVPB        2 g 200 mL/hr over 30 Minutes Intravenous Every 24 hours 06/27/20 0621     06/26/20 2030  cefTRIAXone (ROCEPHIN) 2 g in sodium chloride 0.9 % 100 mL IVPB        2 g 200 mL/hr over 30 Minutes Intravenous  Once 06/26/20 2028 06/26/20 2209      Assessment/Plan:  1 - RIGHT Ureteral Stone - temporized with stent. Will need definitive stone management with ureteroscopy in elective setting, we will arrange.   2 - Pyelonephritis - improving quickly. I feel pt OK for DC from GU perspective at anypoint as no high grade fevers in 24 hours, prodet to continue ABX cours with PO meds for 10 day or so course.     Please call me directly with questions anytime.   Brooke Lucas 06/28/2020

## 2020-06-29 LAB — URINE CULTURE: Culture: 80000 — AB

## 2020-07-01 LAB — CULTURE, BLOOD (ROUTINE X 2)
Culture: NO GROWTH
Culture: NO GROWTH
Special Requests: ADEQUATE

## 2020-07-01 NOTE — Progress Notes (Deleted)
    SUBJECTIVE:   CHIEF COMPLAINT / HPI: hospital follow up   Adilyn Humes Pittmanis a 35 y.o.femalewho presented with right-sided abdominal pain. She was admitted to hospital on 5/17 and CT of her abdomen showed the ureteral stone of 58mm causing obstruction. Patient was treated with CTX and urology was consulted and completed a ureteral stent.  Today patient reports ***   PERTINENT  PMH / PSH:  Nephrolithiasis   OBJECTIVE:   LMP 06/11/2020 Comment: neg preg test  General: female appearing stated age in no acute distress HEENT: MMM, no oral lesions noted,Neck non-tender without lymphadenopathy, masses or thyromegaly*** Cardio: Normal S1 and S2, no S3 or S4. Rhythm is regular***. No murmurs or rubs.  Bilateral radial pulses palpable Pulm: Clear to auscultation bilaterally, no crackles, wheezing, or diminished breath sounds. Normal respiratory effort, stable on *** Abdomen: Bowel sounds normal. Abdomen soft and non-tender. *** Extremities: No peripheral edema. Warm/ well perfused. *** Neuro: pt alert and oriented x4    ASSESSMENT/PLAN:   No problem-specific Assessment & Plan notes found for this encounter.     Ronnald Ramp, MD [facility]   {    This will disappear when note is signed, click to select method of visit    :1}

## 2020-07-02 ENCOUNTER — Inpatient Hospital Stay: Payer: Medicaid Other

## 2020-07-02 ENCOUNTER — Other Ambulatory Visit: Payer: Self-pay | Admitting: Urology

## 2020-07-03 ENCOUNTER — Ambulatory Visit (INDEPENDENT_AMBULATORY_CARE_PROVIDER_SITE_OTHER): Payer: Medicaid Other

## 2020-07-03 ENCOUNTER — Other Ambulatory Visit: Payer: Self-pay

## 2020-07-03 ENCOUNTER — Ambulatory Visit (INDEPENDENT_AMBULATORY_CARE_PROVIDER_SITE_OTHER): Payer: Medicaid Other | Admitting: Family Medicine

## 2020-07-03 DIAGNOSIS — N200 Calculus of kidney: Secondary | ICD-10-CM

## 2020-07-03 DIAGNOSIS — Z23 Encounter for immunization: Secondary | ICD-10-CM

## 2020-07-03 NOTE — Progress Notes (Signed)
    SUBJECTIVE:   CHIEF COMPLAINT / HPI:   Hospital follow-up for kidney stone Ms. Stegemann reports that she was discharged from the hospital following treatment for a kidney stone.  She was found to have a 6 mm kidney stone in her right ureter.  She had a stent placed and was told she is not expected to pass the stone on her own.  She was discharged from the hospital with Keflex, tamsulosin and naproxen.  She reports that she has not been taking her naproxen because she feels significantly improved and has not had significant pain.  She has been taking her antibiotics 4 times a day as prescribed and her tamsulosin daily.  She is aware that she has follow-up appointment with urology on 1 June.  PERTINENT  PMH / PSH: No previous history of kidney stone  OBJECTIVE:   BP 96/60   Pulse 83   Ht 5\' 7"  (1.702 m)   Wt 155 lb 6 oz (70.5 kg)   LMP 06/11/2020 Comment: neg preg test  SpO2 99%   BMI 24.34 kg/m    General: Alert and cooperative and appears to be in no acute distress Cardio: Normal S1 and S2, no S3 or S4. Rhythm is regular. No murmurs or rubs.   Pulm: Clear to auscultation bilaterally, no crackles, wheezing, or diminished breath sounds. Normal respiratory effort Abdomen: No CVA tenderness.  No flank pain. Extremities: No peripheral edema. Warm/ well perfused.  Strong radial pulses. Neuro: Cranial nerves grossly intact  ASSESSMENT/PLAN:   Kidney stone She reports significant improvement since hospital discharge.  No further labs needed today. -Continue Keflex 4 times daily for total of 8 days -Continue tamsulosin until kidney stone is removed or passes -Continue naproxen 500 mg twice daily as needed -Follow-up with urology for next procedure for stent removal     08/11/2020, MD [facility]

## 2020-07-03 NOTE — Assessment & Plan Note (Signed)
She reports significant improvement since hospital discharge.  No further labs needed today. -Continue Keflex 4 times daily for total of 8 days -Continue tamsulosin until kidney stone is removed or passes -Continue naproxen 500 mg twice daily as needed -Follow-up with urology for next procedure for stent removal

## 2020-07-03 NOTE — Patient Instructions (Signed)
Kidney stone: I am glad to hear that you are feeling so much better from your kidney stone.  Make sure you continue to drink lots of water and continue taking medications.  Is a quick summary of the medications: -Keflex 4 times a day for total 8 days -Naproxen 500 mg twice daily as needed -Tamsulosin (Flomax) 1 pill/day until you pass the stone (you were given a 30-day supply)

## 2020-07-06 ENCOUNTER — Other Ambulatory Visit: Payer: Self-pay | Admitting: Urology

## 2020-07-06 DIAGNOSIS — N200 Calculus of kidney: Secondary | ICD-10-CM

## 2020-07-10 ENCOUNTER — Other Ambulatory Visit: Payer: Self-pay

## 2020-07-10 ENCOUNTER — Telehealth (INDEPENDENT_AMBULATORY_CARE_PROVIDER_SITE_OTHER): Payer: Medicaid Other | Admitting: Urology

## 2020-07-10 DIAGNOSIS — N200 Calculus of kidney: Secondary | ICD-10-CM | POA: Diagnosis not present

## 2020-07-10 NOTE — Progress Notes (Signed)
Virtual Visit via Telephone Note  I connected with Brooke Lucas on 07/10/20 at 11:45 AM EDT by telephone and verified that I am speaking with the correct person using two identifiers.   Patient location: Home Provider location: [facility]rologic Office   I discussed the limitations, risks, security and privacy concerns of performing an evaluation and management service by telephone and the availability of in person appointments. We discussed the impact of the COVID-19 pandemic on the healthcare system, and the importance of social distancing and reducing patient and provider exposure. I also discussed with the patient that there may be a patient responsible charge related to this service. The patient expressed understanding and agreed to proceed.  Reason for visit: Discuss ureteroscopy  History of Present Illness: 35 year old female who previously presented to [facility] hospital with right pyelonephritis and a 6 mm right mid ureteral stone and underwent urgent stent placement by Dr. Kathrynn Running on 06/27/2020, and was treated with antibiotics.  Culture ultimately grew pansensitive E. coli.  Her insurance was not taken by [facility] and she was referred to [place] for definitive stone treatment.  We discussed the need for definitive stone removal after treatment of her infection with ureteroscopy, laser lithotripsy, stent exchange. We specifically discussed the risks ureteroscopy including bleeding, infection/sepsis, stent related symptoms including flank pain/urgency/frequency/incontinence/dysuria, ureteral injury, inability to access stone, or need for staged or additional procedures.  Right ureteroscopy, laser lithotripsy, stent placement this week   I discussed the assessment and treatment plan with the patient. The patient was provided an opportunity to ask questions and all were answered. The patient agreed with the plan and demonstrated an understanding of the instructions.    The patient was advised to call back or seek an in-person evaluation if the symptoms worsen or if the condition fails to improve as anticipated.  I provided 12 minutes of non-face-to-face time during this encounter.   Sondra Come, MD

## 2020-07-11 ENCOUNTER — Ambulatory Visit (HOSPITAL_BASED_OUTPATIENT_CLINIC_OR_DEPARTMENT_OTHER): Admission: RE | Admit: 2020-07-11 | Payer: Medicaid Other | Source: Home / Self Care | Admitting: Urology

## 2020-07-11 ENCOUNTER — Encounter (HOSPITAL_BASED_OUTPATIENT_CLINIC_OR_DEPARTMENT_OTHER): Admission: RE | Payer: Self-pay | Source: Home / Self Care

## 2020-07-11 SURGERY — CYSTOURETEROSCOPY, WITH RETROGRADE PYELOGRAM AND STENT INSERTION
Anesthesia: General | Laterality: Right

## 2020-07-12 ENCOUNTER — Other Ambulatory Visit
Admission: RE | Admit: 2020-07-12 | Discharge: 2020-07-12 | Disposition: A | Discharge status: Home / Self Care | Payer: Medicaid Other | Source: Ambulatory Visit | Attending: Urology | Admitting: Urology

## 2020-07-12 ENCOUNTER — Other Ambulatory Visit: Payer: Self-pay

## 2020-07-12 HISTORY — DX: Personal history of urinary calculi: Z87.442

## 2020-07-12 NOTE — Patient Instructions (Signed)
Your procedure is scheduled on: Friday July 12, 2020.  Report to Day Surgery inside [facility] 2nd floor (stop by admissions desk first before getting on elevator). To find out your arrival time please call [phone] between 1PM - 3PM on Thursday July 12, 2020.  Remember: Instructions that are not followed completely may result in serious medical risk,  up to and including death, or upon the discretion of your surgeon and anesthesiologist your  surgery may need to be rescheduled.     _X__ 1. Do not eat food or drink fluids after midnight the night before your procedure.                 No chewing gum or hard candies.   __X__2.  On the morning of surgery brush your teeth with toothpaste and water, you                may rinse your mouth with mouthwash if you wish.  Do not swallow any toothpaste of mouthwash.     _X__ 3.  No Alcohol for 24 hours before or after surgery.   _X__ 4.  Do Not Smoke or use e-cigarettes For 24 Hours Prior to Your Surgery.                 Do not use any chewable tobacco products for at least 6 hours prior to                 Surgery.  _X__  5.  Do not use any recreational drugs (marijuana, cocaine, heroin, ecstasy, MDMA or other)                For at least one week prior to your surgery.  Combination of these drugs with anesthesia                May have life threatening results.  __X__ 6.  Notify your doctor if there is any change in your medical condition      (cold, fever, infections).     Do not wear jewelry, make-up, hairpins, clips or nail polish. Do not wear lotions, powders, or perfumes. You may wear deodorant. Do not shave 48 hours prior to surgery. Men may shave face and neck. Do not bring valuables to the hospital.    [facility] is not responsible for any belongings or valuables.  Contacts, dentures or bridgework may not be worn into surgery. Leave your suitcase in the car. After surgery it may be brought to your  room. For patients admitted to the hospital, discharge time is determined by your treatment team.   Patients discharged the day of surgery will not be allowed to drive home.   Make arrangements for someone to be with you for the first 24 hours of your Same Day Discharge.    __X__ Take these medicines the morning of surgery with A SIP OF WATER:    1. tamsulosin (FLOMAX) 0.4 MG    2.   3.   4.  5.  6.  ____ Fleet Enema (as directed)   __X__ Use antibacterial Soap as directed  ____ Use Benzoyl Peroxide Gel as instructed  ____ Use inhalers on the day of surgery  ____ Stop metformin 2 days prior to surgery    ____ Take 1/2 of usual insulin dose the night before surgery. No insulin the morning          of surgery.   ____ Call your PCP, cardiologist, or Pulmonologist  if taking Coumadin/Plavix/aspirin and ask when to stop before your surgery.   __X__ One Week prior to surgery- Stop Anti-inflammatories such as Ibuprofen, Aleve, Advil, Motrin, meloxicam (MOBIC), diclofenac, etodolac, ketorolac, Toradol, Daypro, piroxicam, Goody's or BC powders. OK TO USE TYLENOL IF NEEDED   __X__ Stop supplements until after surgery.    ____ Bring C-Pap to the hospital.    If you have any questions regarding your pre-procedure instructions,  Please call Pre-admit Testing at [phone].

## 2020-07-13 ENCOUNTER — Ambulatory Visit: Payer: Medicaid Other | Admitting: Certified Registered"

## 2020-07-13 ENCOUNTER — Other Ambulatory Visit: Payer: Self-pay

## 2020-07-13 ENCOUNTER — Ambulatory Visit
Admission: RE | Admit: 2020-07-13 | Discharge: 2020-07-13 | Disposition: A | Discharge status: Home / Self Care | Payer: Medicaid Other | Attending: Urology | Admitting: Urology

## 2020-07-13 ENCOUNTER — Encounter: Payer: Self-pay | Admitting: Urology

## 2020-07-13 ENCOUNTER — Encounter
Admission: RE | Disposition: A | Discharge status: Home / Self Care | Payer: Self-pay | Source: Home / Self Care | Attending: Urology

## 2020-07-13 ENCOUNTER — Ambulatory Visit: Payer: Medicaid Other

## 2020-07-13 DIAGNOSIS — N132 Hydronephrosis with renal and ureteral calculous obstruction: Secondary | ICD-10-CM | POA: Diagnosis not present

## 2020-07-13 DIAGNOSIS — N201 Calculus of ureter: Secondary | ICD-10-CM

## 2020-07-13 DIAGNOSIS — N200 Calculus of kidney: Secondary | ICD-10-CM

## 2020-07-13 HISTORY — PX: CYSTOSCOPY/URETEROSCOPY/HOLMIUM LASER/STENT PLACEMENT: SHX6546

## 2020-07-13 LAB — URINE DRUG SCREEN, QUALITATIVE (ARMC ONLY)
Amphetamines, Ur Screen: NOT DETECTED
Barbiturates, Ur Screen: NOT DETECTED
Benzodiazepine, Ur Scrn: NOT DETECTED
Cannabinoid 50 Ng, Ur ~~LOC~~: POSITIVE — AB
Cocaine Metabolite,Ur ~~LOC~~: NOT DETECTED
MDMA (Ecstasy)Ur Screen: NOT DETECTED
Methadone Scn, Ur: NOT DETECTED
Opiate, Ur Screen: NOT DETECTED
Phencyclidine (PCP) Ur S: NOT DETECTED
Tricyclic, Ur Screen: NOT DETECTED

## 2020-07-13 LAB — POCT PREGNANCY, URINE: Preg Test, Ur: NEGATIVE

## 2020-07-13 SURGERY — CYSTOSCOPY/URETEROSCOPY/HOLMIUM LASER/STENT PLACEMENT
Anesthesia: General | Site: Ureter | Laterality: Right

## 2020-07-13 MED ORDER — ONDANSETRON HCL 4 MG/2ML IJ SOLN
INTRAMUSCULAR | Status: AC
  Filled 2020-07-13: qty 2

## 2020-07-13 MED ORDER — FAMOTIDINE 20 MG PO TABS: 20.0000 mg | ORAL_TABLET | Freq: Once | ORAL | Status: AC

## 2020-07-13 MED ORDER — ORAL CARE MOUTH RINSE: 15.0000 mL | Freq: Once | OROMUCOSAL | Status: AC

## 2020-07-13 MED ORDER — ACETAMINOPHEN 10 MG/ML IV SOLN
INTRAVENOUS | Status: AC
  Filled 2020-07-13: qty 200

## 2020-07-13 MED ORDER — CHLORHEXIDINE GLUCONATE 0.12 % MT SOLN: 15.0000 mL | Freq: Once | OROMUCOSAL | Status: AC

## 2020-07-13 MED ORDER — FAMOTIDINE 20 MG PO TABS
ORAL_TABLET | ORAL | Status: AC
  Administered 2020-07-13: 20 mg via ORAL
  Filled 2020-07-13: qty 1

## 2020-07-13 MED ORDER — CIPROFLOXACIN IN D5W 400 MG/200ML IV SOLN
400.0000 mg | INTRAVENOUS | Status: AC
Start: 2020-07-13 — End: 2020-07-13

## 2020-07-13 MED ORDER — LIDOCAINE HCL (CARDIAC) PF 100 MG/5ML IV SOSY
PREFILLED_SYRINGE | INTRAVENOUS | Status: DC | PRN
  Administered 2020-07-13: 60 mg via INTRAVENOUS

## 2020-07-13 MED ORDER — MIDAZOLAM HCL 2 MG/2ML IJ SOLN
INTRAMUSCULAR | Status: AC
  Filled 2020-07-13: qty 2

## 2020-07-13 MED ORDER — KETOROLAC TROMETHAMINE 30 MG/ML IJ SOLN
INTRAMUSCULAR | Status: DC | PRN
  Administered 2020-07-13: 30 mg via INTRAVENOUS

## 2020-07-13 MED ORDER — ONDANSETRON HCL 4 MG/2ML IJ SOLN: 4.0000 mg | Freq: Once | INTRAMUSCULAR | Status: DC | PRN

## 2020-07-13 MED ORDER — IOHEXOL 180 MG/ML  SOLN
INTRAMUSCULAR | Status: DC | PRN
  Administered 2020-07-13: 5 mL

## 2020-07-13 MED ORDER — SULFAMETHOXAZOLE-TRIMETHOPRIM 800-160 MG PO TABS: 1.0000 | ORAL_TABLET | Freq: Every day | ORAL | 0 refills | Status: DC

## 2020-07-13 MED ORDER — OXYCODONE HCL 5 MG PO TABS: 5.0000 mg | ORAL_TABLET | Freq: Once | ORAL | Status: DC | PRN

## 2020-07-13 MED ORDER — PROPOFOL 10 MG/ML IV BOLUS
INTRAVENOUS | Status: DC | PRN
  Administered 2020-07-13: 200 mg via INTRAVENOUS

## 2020-07-13 MED ORDER — ROCURONIUM BROMIDE 10 MG/ML (PF) SYRINGE
PREFILLED_SYRINGE | INTRAVENOUS | Status: AC
  Filled 2020-07-13: qty 10

## 2020-07-13 MED ORDER — CIPROFLOXACIN IN D5W 400 MG/200ML IV SOLN
INTRAVENOUS | Status: AC
  Administered 2020-07-13: 400 mg via INTRAVENOUS
  Filled 2020-07-13: qty 200

## 2020-07-13 MED ORDER — ROCURONIUM BROMIDE 100 MG/10ML IV SOLN
INTRAVENOUS | Status: DC | PRN
  Administered 2020-07-13: 40 mg via INTRAVENOUS

## 2020-07-13 MED ORDER — SUCCINYLCHOLINE CHLORIDE 200 MG/10ML IV SOSY
PREFILLED_SYRINGE | INTRAVENOUS | Status: AC
  Filled 2020-07-13: qty 10

## 2020-07-13 MED ORDER — SUGAMMADEX SODIUM 200 MG/2ML IV SOLN
INTRAVENOUS | Status: DC | PRN
  Administered 2020-07-13: 200 mg via INTRAVENOUS

## 2020-07-13 MED ORDER — ACETAMINOPHEN 10 MG/ML IV SOLN
INTRAVENOUS | Status: DC | PRN
  Administered 2020-07-13: 1000 mg via INTRAVENOUS

## 2020-07-13 MED ORDER — GENTAMICIN SULFATE 40 MG/ML IJ SOLN
5.0000 mg/kg | INTRAVENOUS | Status: AC
  Administered 2020-07-13: 26064 mg via INTRAVENOUS
  Filled 2020-07-13: qty 9

## 2020-07-13 MED ORDER — DEXAMETHASONE SODIUM PHOSPHATE 10 MG/ML IJ SOLN
INTRAMUSCULAR | Status: DC | PRN
  Administered 2020-07-13: 5 mg via INTRAVENOUS

## 2020-07-13 MED ORDER — LACTATED RINGERS IV SOLN: INTRAVENOUS | Status: DC

## 2020-07-13 MED ORDER — CHLORHEXIDINE GLUCONATE 0.12 % MT SOLN
OROMUCOSAL | Status: AC
  Administered 2020-07-13: 15 mL via OROMUCOSAL
  Filled 2020-07-13: qty 15

## 2020-07-13 MED ORDER — FENTANYL CITRATE (PF) 100 MCG/2ML IJ SOLN: 25.0000 ug | INTRAMUSCULAR | Status: DC | PRN

## 2020-07-13 MED ORDER — ACETAMINOPHEN 10 MG/ML IV SOLN: 1000.0000 mg | Freq: Once | INTRAVENOUS | Status: DC | PRN

## 2020-07-13 MED ORDER — ONDANSETRON HCL 4 MG/2ML IJ SOLN
INTRAMUSCULAR | Status: DC | PRN
  Administered 2020-07-13: 4 mg via INTRAVENOUS

## 2020-07-13 MED ORDER — OXYCODONE HCL 5 MG/5ML PO SOLN: 5.0000 mg | Freq: Once | ORAL | Status: DC | PRN

## 2020-07-13 MED ORDER — DEXAMETHASONE SODIUM PHOSPHATE 10 MG/ML IJ SOLN
INTRAMUSCULAR | Status: AC
  Filled 2020-07-13: qty 1

## 2020-07-13 MED ORDER — FENTANYL CITRATE (PF) 100 MCG/2ML IJ SOLN
INTRAMUSCULAR | Status: AC
  Filled 2020-07-13: qty 2

## 2020-07-13 MED ORDER — FENTANYL CITRATE (PF) 100 MCG/2ML IJ SOLN
INTRAMUSCULAR | Status: DC | PRN
  Administered 2020-07-13: 100 ug via INTRAVENOUS

## 2020-07-13 SURGICAL SUPPLY — 29 items
BAG DRAIN CYSTO-URO LG1000N (MISCELLANEOUS) ×2 IMPLANT
BRUSH SCRUB EZ 1% IODOPHOR (MISCELLANEOUS) ×2 IMPLANT
CATH URET FLEX-TIP 2 LUMEN 10F (CATHETERS) IMPLANT
CATH URETL 5X70 OPEN END (CATHETERS) ×1 IMPLANT
CNTNR SPEC 2.5X3XGRAD LEK (MISCELLANEOUS)
CONT SPEC 4OZ STER OR WHT (MISCELLANEOUS)
CONT SPEC 4OZ STRL OR WHT (MISCELLANEOUS)
CONTAINER SPEC 2.5X3XGRAD LEK (MISCELLANEOUS) IMPLANT
DRAPE UTILITY 15X26 TOWEL STRL (DRAPES) ×2 IMPLANT
DRSG TEGADERM 2-3/8X2-3/4 SM (GAUZE/BANDAGES/DRESSINGS) ×2 IMPLANT
GLOVE SURG UNDER POLY LF SZ7.5 (GLOVE) ×2 IMPLANT
GOWN STRL REUS W/ TWL LRG LVL3 (GOWN DISPOSABLE) ×1 IMPLANT
GOWN STRL REUS W/ TWL XL LVL3 (GOWN DISPOSABLE) ×1 IMPLANT
GOWN STRL REUS W/TWL LRG LVL3 (GOWN DISPOSABLE) ×2
GOWN STRL REUS W/TWL XL LVL3 (GOWN DISPOSABLE) ×2
GUIDEWIRE STR DUAL SENSOR (WIRE) ×2 IMPLANT
INFUSOR MANOMETER BAG 3000ML (MISCELLANEOUS) ×2 IMPLANT
IV NS IRRIG 3000ML ARTHROMATIC (IV SOLUTION) ×2 IMPLANT
KIT TURNOVER CYSTO (KITS) ×2 IMPLANT
PACK CYSTO AR (MISCELLANEOUS) ×2 IMPLANT
SET CYSTO W/LG BORE CLAMP LF (SET/KITS/TRAYS/PACK) ×2 IMPLANT
SHEATH URETERAL 12FRX35CM (MISCELLANEOUS) IMPLANT
STENT URET 6FRX24 CONTOUR (STENTS) IMPLANT
STENT URET 6FRX26 CONTOUR (STENTS) IMPLANT
SURGILUBE 2OZ TUBE FLIPTOP (MISCELLANEOUS) ×2 IMPLANT
SYR 10ML LL (SYRINGE) ×2 IMPLANT
TRACTIP FLEXIVA PULSE ID 200 (Laser) IMPLANT
VALVE UROSEAL ADJ ENDO (VALVE) IMPLANT
WATER STERILE IRR 1000ML POUR (IV SOLUTION) ×2 IMPLANT

## 2020-07-13 NOTE — Interval H&P Note (Signed)
UROLOGY H&P UPDATE  Agree with prior H&P dated 06/27/2020 by Dr. Berneice Heinrich.  35 year old female who presented with right pyelonephritis and a 5 mm right distal ureteral stone and was stented for infection.  Cardiac: RRR Lungs: CTA bilaterally  Laterality: Right Procedure: Right ureteroscopy, laser lithotripsy, stent exchange  Urine: 06/26/2020 culture with E. coli, was treated with culture appropriate antibiotics  We specifically discussed the risks ureteroscopy including bleeding, infection/sepsis, stent related symptoms including flank pain/urgency/frequency/incontinence/dysuria, ureteral injury, inability to access stone, or need for staged or additional procedures.   Sondra Come, MD 07/13/2020

## 2020-07-13 NOTE — Anesthesia Postprocedure Evaluation (Signed)
Anesthesia Post Note  Patient: Brooke Lucas  Procedure(s) Performed: CYSTOSCOPY/URETEROSCOPY/HOLMIUM LASER/STENT PLACEMENT (Right Ureter)  Patient location during evaluation: PACU Anesthesia Type: General Level of consciousness: awake and alert Pain management: pain level controlled Vital Signs Assessment: post-procedure vital signs reviewed and stable Respiratory status: spontaneous breathing, nonlabored ventilation, respiratory function stable and patient connected to nasal cannula oxygen Cardiovascular status: blood pressure returned to baseline and stable Postop Assessment: no apparent nausea or vomiting Anesthetic complications: no   No complications documented.   Last Vitals:  Vitals:   07/13/20 1036 07/13/20 1046  BP: 105/71 112/79  Pulse: 71 67  Resp: (!) 21 18  Temp:  (!) 36 C  SpO2: 100% 99%    Last Pain:  Vitals:   07/13/20 1046  TempSrc: Temporal  PainSc: 0-No pain                 Corinda Gubler

## 2020-07-13 NOTE — Transfer of Care (Signed)
Immediate Anesthesia Transfer of Care Note  Patient: Brooke Lucas  Procedure(s) Performed: CYSTOSCOPY/URETEROSCOPY/HOLMIUM LASER/STENT PLACEMENT (Right Ureter)  Patient Location: PACU  Anesthesia Type:General  Level of Consciousness: awake, alert , oriented, patient cooperative and responds to stimulation  Airway & Oxygen Therapy: Patient Spontanous Breathing  Post-op Assessment: Report given to RN and Post -op Vital signs reviewed and stable  Post vital signs: stable  Last Vitals:  Vitals Value Taken Time  BP 117/77 07/13/20 1011  Temp    Pulse 84 07/13/20 1014  Resp 16 07/13/20 1014  SpO2 100 % 07/13/20 1014  Vitals shown include unvalidated device data.  Last Pain:  Vitals:   07/13/20 0821  TempSrc: Oral  PainSc: 0-No pain         Complications: No complications documented.

## 2020-07-13 NOTE — Anesthesia Procedure Notes (Signed)
Procedure Name: Intubation Date/Time: 07/13/2020 9:36 AM Performed by: Cheral Bay, CRNA Pre-anesthesia Checklist: Patient identified, Emergency Drugs available, Suction available and Patient being monitored Patient Re-evaluated:Patient Re-evaluated prior to induction Oxygen Delivery Method: Circle system utilized Preoxygenation: Pre-oxygenation with 100% oxygen Induction Type: IV induction Ventilation: Mask ventilation without difficulty Laryngoscope Size: McGraph and 3 Grade View: Grade I Tube type: Oral Tube size: 6.5 mm Number of attempts: 1 Airway Equipment and Method: Stylet Placement Confirmation: ETT inserted through vocal cords under direct vision,  positive ETCO2 and breath sounds checked- equal and bilateral Secured at: 21 cm Tube secured with: Tape Dental Injury: Teeth and Oropharynx as per pre-operative assessment

## 2020-07-13 NOTE — Discharge Instructions (Signed)

## 2020-07-13 NOTE — Op Note (Signed)
Date of procedure: 07/13/20  Preoperative diagnosis:  1. Right distal ureteral stone  Postoperative diagnosis:  1. Spontaneous passage of stone  Procedure: 1. Cystoscopy, right diagnostic ureteroscopy, right retrograde pyelogram with intraoperative interpretation, right ureteral stent placement  Surgeon: Legrand Rams, MD  Anesthesia: General  Complications: None  Intraoperative findings:  1.  Normal cystoscopy, thorough right ureteroscopy/pyeloscopy with no evidence of stone, suspect passed spontaneously alongside stent 2.  Uncomplicated stent placement with Dangler  EBL: None  Specimens: None  Drains: Right 6 French by 24 cm ureteral stent with Dangler  Indication: [name] is a 35 y.o. patient who was previously stented in [facility] for pyelonephritis and a 5 mm right distal ureteral stone and presents today for definitive management.  After reviewing the management options for treatment, they elected to proceed with the above surgical procedure(s). We have discussed the potential benefits and risks of the procedure, side effects of the proposed treatment, the likelihood of the patient achieving the goals of the procedure, and any potential problems that might occur during the procedure or recuperation. Informed consent has been obtained.  Description of procedure:  The patient was taken to the operating room and general anesthesia was induced. SCDs were placed for DVT prophylaxis. The patient was placed in the dorsal lithotomy position, prepped and draped in the usual sterile fashion, and preoperative antibiotics were administered. A preoperative time-out was performed.   A 21 French rigid cystoscope was used to intubate the urethra and thorough cystoscopy was performed.  The bladder was grossly normal and there was a stent emanating from the right ureteral orifice.  A wire passed easily alongside the stent up to the kidney under fluoroscopic vision.  The cystoscope was  reinserted and the old stent removed.  A semirigid ureteroscope advanced easily alongside the wire and thorough ureteroscopy showed no evidence of stone.  I was able to advance the scope all the way up to the proximal ureter.  At this point I opted to perform flexible pyeloscopy to confirm the stone had not been pushed up to the kidney during prior stent placement.  The single channel digital flexible ureteroscope advanced easily over the wire up to the kidney and thorough pyeloscopy was performed showing no evidence of stones.  Retrograde pyelogram showed no filling defects or extravasation.  Careful pullback ureteroscopy showed no evidence of stone.  The wire was replaced the scope back up to the kidney.  Under fluoroscopic vision, a 6 [place] by 26 cm ureteral stent was placed with an excellent curl in the renal pelvis, as well as in the bladder.  The bladder was drained and the Dangler was secured to the right inner thigh with Tegaderm.  Disposition: Stable to PACU  Plan: Okay to remove stent Monday morning Bactrim prophylaxis with stent in place  Legrand Rams, MD

## 2020-07-13 NOTE — Anesthesia Preprocedure Evaluation (Signed)
Anesthesia Evaluation  Patient identified by MRN, date of birth, ID band Patient awake    Reviewed: Allergy & Precautions, NPO status , Patient's Chart, lab work & pertinent test results  History of Anesthesia Complications Negative for: history of anesthetic complications  Airway Mallampati: II  TM Distance: >3 FB Neck ROM: Full    Dental no notable dental hx. (+) Teeth Intact   Pulmonary neg sleep apnea, neg COPD, Current SmokerPatient did not abstain from smoking.,    Pulmonary exam normal breath sounds clear to auscultation       Cardiovascular Exercise Tolerance: Good METS(-) hypertension(-) CAD and (-) Past MI negative cardio ROS  (-) dysrhythmias  Rhythm:Regular Rate:Normal - Systolic murmurs    Neuro/Psych PSYCHIATRIC DISORDERS Anxiety Depression negative neurological ROS     GI/Hepatic neg GERD  ,(+)     (-) substance abuse  ,   Endo/Other  neg diabetes  Renal/GU negative Renal ROS     Musculoskeletal   Abdominal   Peds  Hematology   Anesthesia Other Findings Past Medical History: No date: Anxiety No date: Depression     Comment:  after loss of son No date: History of kidney stones No date: Sickle cell trait (HCC) No date: Trichomonas  Reproductive/Obstetrics                             Anesthesia Physical Anesthesia Plan  ASA: II  Anesthesia Plan: General   Post-op Pain Management:    Induction: Intravenous  PONV Risk Score and Plan: 3 and Ondansetron, Dexamethasone and Midazolam  Airway Management Planned: Oral ETT and LMA  Additional Equipment: None  Intra-op Plan:   Post-operative Plan: Extubation in OR  Informed Consent: I have reviewed the patients History and Physical, chart, labs and discussed the procedure including the risks, benefits and alternatives for the proposed anesthesia with the patient or authorized representative who has indicated  his/her understanding and acceptance.     Dental advisory given  Plan Discussed with: CRNA and Surgeon  Anesthesia Plan Comments: (Discussed risks of anesthesia with patient, including PONV, sore throat, lip/dental damage. Rare risks discussed as well, such as cardiorespiratory and neurological sequelae. Patient understands.)        Anesthesia Quick Evaluation

## 2020-08-09 ENCOUNTER — Emergency Department (HOSPITAL_COMMUNITY)
Admission: EM | Admit: 2020-08-09 | Discharge: 2020-08-09 | Disposition: A | Discharge status: Home / Self Care | Payer: Medicaid Other | Attending: Emergency Medicine | Admitting: Emergency Medicine

## 2020-08-09 ENCOUNTER — Encounter (HOSPITAL_COMMUNITY): Payer: Self-pay | Admitting: Emergency Medicine

## 2020-08-09 ENCOUNTER — Emergency Department (HOSPITAL_COMMUNITY): Payer: Medicaid Other

## 2020-08-09 ENCOUNTER — Other Ambulatory Visit: Payer: Self-pay

## 2020-08-09 DIAGNOSIS — F17210 Nicotine dependence, cigarettes, uncomplicated: Secondary | ICD-10-CM | POA: Insufficient documentation

## 2020-08-09 DIAGNOSIS — R109 Unspecified abdominal pain: Secondary | ICD-10-CM | POA: Diagnosis not present

## 2020-08-09 LAB — URINALYSIS, ROUTINE W REFLEX MICROSCOPIC
Bilirubin Urine: NEGATIVE
Glucose, UA: NEGATIVE mg/dL
Hgb urine dipstick: NEGATIVE
Ketones, ur: NEGATIVE mg/dL
Leukocytes,Ua: NEGATIVE
Nitrite: NEGATIVE
Protein, ur: NEGATIVE mg/dL
Specific Gravity, Urine: 1.014 (ref 1.005–1.030)
pH: 6 (ref 5.0–8.0)

## 2020-08-09 LAB — BASIC METABOLIC PANEL
Anion gap: 9 (ref 5–15)
BUN: 8 mg/dL (ref 6–20)
CO2: 24 mmol/L (ref 22–32)
Calcium: 8.9 mg/dL (ref 8.9–10.3)
Chloride: 105 mmol/L (ref 98–111)
Creatinine, Ser: 0.9 mg/dL (ref 0.44–1.00)
GFR, Estimated: >60 mL/min (ref >60)
Glucose, Bld: 108 mg/dL — ABNORMAL HIGH (ref 70–99)
Potassium: 3.6 mmol/L (ref 3.5–5.1)
Sodium: 138 mmol/L (ref 135–145)

## 2020-08-09 LAB — HEPATIC FUNCTION PANEL
ALT: 12 U/L (ref 0–44)
AST: 17 U/L (ref 15–41)
Albumin: 3.7 g/dL (ref 3.5–5.0)
Alkaline Phosphatase: 48 U/L (ref 38–126)
Bilirubin, Direct: <0.1 mg/dL (ref 0.0–0.2)
Total Bilirubin: 0.7 mg/dL (ref 0.3–1.2)
Total Protein: 6.2 g/dL — ABNORMAL LOW (ref 6.5–8.1)

## 2020-08-09 LAB — CBC
HCT: 38.5 % (ref 36.0–46.0)
Hemoglobin: 12.9 g/dL (ref 12.0–15.0)
MCH: 29.7 pg (ref 26.0–34.0)
MCHC: 33.5 g/dL (ref 30.0–36.0)
MCV: 88.5 fL (ref 80.0–100.0)
Platelets: 355 10*3/uL (ref 150–400)
RBC: 4.35 MIL/uL (ref 3.87–5.11)
RDW: 14.2 % (ref 11.5–15.5)
WBC: 5 10*3/uL (ref 4.0–10.5)
nRBC: 0 % (ref 0.0–0.2)

## 2020-08-09 LAB — I-STAT BETA HCG BLOOD, ED (MC, WL, AP ONLY): I-stat hCG, quantitative: <5 m[IU]/mL (ref <5)

## 2020-08-09 LAB — HIV ANTIBODY (ROUTINE TESTING W REFLEX): HIV Screen 4th Generation wRfx: NONREACTIVE

## 2020-08-09 LAB — WET PREP, GENITAL
Clue Cells Wet Prep HPF POC: NONE SEEN
Sperm: NONE SEEN
Trich, Wet Prep: NONE SEEN
Yeast Wet Prep HPF POC: NONE SEEN

## 2020-08-09 LAB — LIPASE, BLOOD: Lipase: 46 U/L (ref 11–51)

## 2020-08-09 LAB — RPR: RPR Ser Ql: NONREACTIVE

## 2020-08-09 MED ORDER — MORPHINE SULFATE (PF) 4 MG/ML IV SOLN
4.0000 mg | Freq: Once | INTRAVENOUS | Status: AC
  Administered 2020-08-09: 4 mg via INTRAVENOUS
  Filled 2020-08-09: qty 1

## 2020-08-09 MED ORDER — ONDANSETRON HCL 4 MG/2ML IJ SOLN
4.0000 mg | Freq: Once | INTRAMUSCULAR | Status: AC
  Administered 2020-08-09: 4 mg via INTRAVENOUS
  Filled 2020-08-09: qty 2

## 2020-08-09 MED ORDER — METHOCARBAMOL 500 MG PO TABS: 500.0000 mg | ORAL_TABLET | Freq: Three times a day (TID) | ORAL | 0 refills | Status: AC | PRN

## 2020-08-09 NOTE — ED Triage Notes (Signed)
Pt c/o left flank pain that started last night.

## 2020-08-09 NOTE — ED Notes (Signed)
Pt transported to CT ?

## 2020-08-09 NOTE — ED Provider Notes (Signed)
[facility] Note   CSN: [id number] Arrival date & time: 08/09/20  [id number]     History Chief Complaint  Patient presents with   Flank Pain    Brooke Lucas is a 35 y.o. female with a hx of kidney stones, pyelonephritis, sickle cell trait, anxiety, depression, & prior tubal ligation who presents to the ED with complaints of left flank pain that began this AM. Patient states pain is in her left back/flank, radiated to the front, no alleviating/aggravating factors. She was concerned it felt similar to prior kidney stone/infection that she was admitted for in May so she came to get checked out. She denies fever, chills, nausea, vomiting, dysuria, hematuria, frequency, diarrhea, vaginal bleeding, or vaginal discharge. Just finished her menstrual period. Recently sexually active.  HPI     Past Medical History:  Diagnosis Date   Anxiety    Depression    after loss of son   History of kidney stones    Sickle cell trait (HCC)    Trichomonas     Patient Active Problem List   Diagnosis Date Noted   Kidney stone 06/26/2020   Alcohol abuse    Fever    ASCUS with positive high risk HPV cervical 12/19/2019   History of bilateral tubal ligation 07/24/2016   Tobacco abuse 07/24/2016   Unwanted fertility 04/24/2016    Past Surgical History:  Procedure Laterality Date   CYSTOSCOPY W/ URETERAL STENT PLACEMENT Right 06/27/2020   Procedure: CYSTOSCOPY WITH RETROGRADE PYELOGRAM/URETERAL STENT PLACEMENT;  Surgeon: Sebastian AcheManny, Theodore, MD;  Location: [facility] OR;  Service: Urology;  Laterality: Right;   CYSTOSCOPY/URETEROSCOPY/HOLMIUM LASER/STENT PLACEMENT Right 07/13/2020   Procedure: CYSTOSCOPY/URETEROSCOPY/HOLMIUM LASER/STENT PLACEMENT;  Surgeon: Sondra ComeSninsky, Brian C, MD;  Location: [facility] ORS;  Service: Urology;  Laterality: Right;   extraction wisdom     TUBAL LIGATION N/A 05/22/2016   Procedure: POST PARTUM TUBAL LIGATION;  Surgeon: Hermina StaggersMichael L Ervin, MD;   [facility];  Service: Gynecology;  Laterality: N/A;     OB History     Gravida  3   Para  3   Term  3   Preterm  0   AB  0   Living  2      SAB  0   IAB  0   Ectopic  0   Multiple  0   Live Births  2           Family History  Problem Relation Age of Onset   Cancer Brother    Birth defects Son        heart   Heart disease Maternal Grandmother    Arthritis Paternal Grandmother    Hypertension Mother    Other Father        Gunshot    Social History   Tobacco Use   Smoking status: Every Day    Packs/day: 0.50    Years: 0.00    Pack years: 0.00    Types: Cigarettes    Last attempt to quit: 07/15/2019    Years since quitting: 1.0   Smokeless tobacco: Never  Vaping Use   Vaping Use: Never used  Substance Use Topics   Alcohol use: Yes    Alcohol/week: 0.0 standard drinks    Comment: Socially   Drug use: Yes    Types: Marijuana    Comment: PT IS +UDS 05/22/16    Home Medications Prior to Admission medications   Medication Sig Start Date End Date Taking?  Authorizing Provider  ibuprofen (ADVIL) 200 MG tablet Take 200 mg by mouth every 6 (six) hours as needed for headache or moderate pain.    [provider]  sulfamethoxazole-trimethoprim (BACTRIM DS) 800-160 MG tablet Take 1 tablet by mouth daily. 07/13/20   Sondra Come, MD  Cetirizine HCl 10 MG CAPS Take 1 capsule (10 mg total) by mouth daily. 11/03/18 08/05/19  Wieters, Hallie C, PA-C    Allergies    Amoxicillin, Penicillins, and Benadryl [diphenhydramine]  Review of Systems   Review of Systems  Constitutional:  Negative for chills and fever.  Respiratory:  Negative for shortness of breath.   Cardiovascular:  Negative for chest pain.  Gastrointestinal:  Negative for diarrhea, nausea and vomiting.  Genitourinary:  Positive for flank pain. Negative for dysuria, frequency, hematuria, vaginal bleeding and vaginal discharge.  Neurological:  Negative for weakness and  numbness.  All other systems reviewed and are negative.  Physical Exam Updated Vital Signs BP 125/66 (BP Location: Left Arm)   Pulse 74   Temp 97.9 F (36.6 C) (Oral)   Resp 20   SpO2 100%   Physical Exam Vitals and nursing note reviewed.  Constitutional:      General: She is not in acute distress.    Appearance: She is well-developed. She is not toxic-appearing.  HENT:     Head: Normocephalic and atraumatic.  Eyes:     General:        Right eye: No discharge.        Left eye: No discharge.     Conjunctiva/sclera: Conjunctivae normal.  Cardiovascular:     Rate and Rhythm: Normal rate and regular rhythm.  Pulmonary:     Effort: Pulmonary effort is normal. No respiratory distress.     Breath sounds: Normal breath sounds. No wheezing, rhonchi or rales.  Abdominal:     General: There is no distension.     Palpations: Abdomen is soft.     Tenderness: There is no abdominal tenderness. There is no right CVA tenderness, left CVA tenderness, guarding or rebound.  Genitourinary:    Comments: Hassan Buckler NT present as chaperone.  No external lesions.  NO discharge/bleeding No CMT or adnexal tenderness.  Musculoskeletal:     Cervical back: Neck supple.  Skin:    General: Skin is warm and dry.     Findings: No rash.  Neurological:     Mental Status: She is alert.     Comments: Sensation & strength grossly intact x 4. Ambulatory. Clear speech.   Psychiatric:        Behavior: Behavior normal.    ED Results / Procedures / Treatments   Labs (all labs ordered are listed, but only abnormal results are displayed) Labs Reviewed  WET PREP, GENITAL - Abnormal; Notable for the following components:      Result Value   WBC, Wet Prep HPF POC MANY (*)    All other components within normal limits  BASIC METABOLIC PANEL - Abnormal; Notable for the following components:   Glucose, Bld 108 (*)    All other components within normal limits  HEPATIC FUNCTION PANEL - Abnormal; Notable  for the following components:   Total Protein 6.2 (*)    All other components within normal limits  URINALYSIS, ROUTINE W REFLEX MICROSCOPIC  CBC  LIPASE, BLOOD  RPR  HIV ANTIBODY (ROUTINE TESTING W REFLEX)  I-STAT BETA HCG BLOOD, ED ([facility], [facility], [facility] ONLY)  GC/CHLAMYDIA PROBE AMP ([employer]) NOT AT [facility]  EKG None  Radiology CT Renal Stone Study  Result Date: 08/09/2020 CLINICAL DATA:  Left-sided flank pain for several hours, initial encounter EXAM: CT ABDOMEN AND PELVIS WITHOUT CONTRAST TECHNIQUE: Multidetector CT imaging of the abdomen and pelvis was performed following the standard protocol without IV contrast. COMPARISON:  06/26/2020 FINDINGS: Lower chest: No acute abnormality. Hepatobiliary: No focal liver abnormality is seen. No gallstones, gallbladder wall thickening, or biliary dilatation. Pancreas: Unremarkable. No pancreatic ductal dilatation or surrounding inflammatory changes. Spleen: Normal in size without focal abnormality. Adrenals/Urinary Tract: Adrenal glands are within normal limits. Previously seen right-sided hydronephrosis has resolved. Previously seen calcification in the expected region of the right mid to distal ureter is again identified adjacent to the uterus stable in appearance from the prior exam. The right ureter is better visualized on today's exam and no obstructive changes are seen. No renal calculi or ureteral stones are noted. Bladder is decompressed. Stomach/Bowel: The appendix is well visualized and within normal limits. No obstructive or inflammatory changes of colon are seen. The small bowel and stomach are unremarkable. Vascular/Lymphatic: Aortic atherosclerosis. No enlarged abdominal or pelvic lymph nodes. Reproductive: Uterus and bilateral adnexa are unremarkable. Other: No abdominal wall hernia or abnormality. No abdominopelvic ascites. Musculoskeletal: No acute or significant osseous findings. IMPRESSION: Previously seen right-sided hydronephrosis has  resolved. The calcification noted on the prior exam is again identified and felt to be extrinsic to the right ureter. No acute abnormality is noted. Electronically Signed   By: Alcide Clever M.D.   On: 08/09/2020 08:22    Procedures Procedures   Medications Ordered in ED Medications  morphine 4 MG/ML injection 4 mg (4 mg Intravenous Given 08/09/20 0734)  ondansetron (ZOFRAN) injection 4 mg (4 mg Intravenous Given 08/09/20 2778)    ED Course  I have reviewed the triage vital signs and the nursing notes.  Pertinent labs & imaging results that were available during my care of the patient were reviewed by me and considered in my medical decision making (see chart for details).    MDM Rules/Calculators/A&P                          Patient presents to the ED with complaints of left flank pain.  She is nontoxic, vitals WNL.  Exam fairly benign.   Additional history obtained:  Additional history obtained from chart review & nursing note review.   Lab Tests:  I Ordered, reviewed, and interpreted labs, which included:  CBC, BMP, lipase, hepatic function panel, preg test, UA, wet prep: fairly unremarkable.   Imaging Studies ordered:  I ordered imaging studies which included CT renal stone study, I independently reviewed, formal radiology impression shows:  Previously seen right-sided hydronephrosis has resolved. The calcification noted on the prior exam is again identified and felt to be extrinsic to the right ureter. No acute abnormality is noted  ED Course:  Labs without findings to suggest UTI/pyelonephritis. CT imaging without acute abnormality- no findings of ureteral lithiasis or other acute process.  Repeat abdominal exam remains without peritoneal signs, do not suspect acute surgical process.  Patient has no neurologic deficits to raise concern for cauda equina syndrome or cord compression.  Pelvic exam was unremarkable do not suspect PID.  Pregnancy test is negative therefore do not  suspect ectopic.  Patient feeling improved status post interventions in the emergency department.  Will treat supportively, trial muscle relaxant.  PCP follow-up. I discussed results, treatment plan, need for follow-up, and return  precautions with the patient. Provided opportunity for questions, patient confirmed understanding and is in agreement with plan.   Portions of this note were generated with Scientist, clinical (histocompatibility and immunogenetics). Dictation errors may occur despite best attempts at proofreading.  Final Clinical Impression(s) / ED Diagnoses Final diagnoses:  Flank pain    Rx / DC Orders ED Discharge Orders          Ordered    methocarbamol (ROBAXIN) 500 MG tablet  Every 8 hours PRN        08/09/20 [address], [place], PA-C 08/09/20 1100    Arby Barrette, MD 08/15/20 1740

## 2020-08-09 NOTE — Discharge Instructions (Addendum)
You were seen in the emergency department today due to pain to your left side.  Your ER work-up was overall reassuring.  Your blood work did not show any significant abnormalities.  Your urine did not show findings of infection.  Your CT scan did not show any new kidney stones.   We are sending home with the following medication help with your symptoms:  - Robaxin is the muscle relaxer I have prescribed, this is meant to help with muscle tightness. Be aware that this medication may make you drowsy therefore the first time you take this it should be at a time you are in an environment where you can rest. Do not drive or operate heavy machinery when taking this medication. Do not drink alcohol or take other sedating medications with this medicine such as narcotics or benzodiazepines.   You make take Tylenol per over the counter dosing with these medications.   We have prescribed you new medication(s) today. Discuss the medications prescribed today with your pharmacist as they can have adverse effects and interactions with your other medicines including over the counter and prescribed medications. Seek medical evaluation if you start to experience new or abnormal symptoms after taking one of these medicines, seek care immediately if you start to experience difficulty breathing, feeling of your throat closing, facial swelling, or rash as these could be indications of a more serious allergic reaction   Please follow-up with primary care within 3 days.  Return to the ER for new or worsening symptoms including but not limited to new or worsening pain, fever, vomiting, pain with urination, trouble breathing, passing out, or any other concerns.

## 2020-08-09 NOTE — ED Notes (Signed)
nusre JON RN IS STARTING AN IV

## 2020-08-09 NOTE — ED Notes (Signed)
RN reviewed discharge instructions w/ pt. Follow up, prescriptions and pain management reviewed. Pt had no further questions. °

## 2020-08-10 LAB — GC/CHLAMYDIA PROBE AMP (~~LOC~~) NOT AT ARMC
Chlamydia: NEGATIVE
Comment: NEGATIVE
Comment: NORMAL
Neisseria Gonorrhea: NEGATIVE

## 2020-09-29 ENCOUNTER — Ambulatory Visit (HOSPITAL_COMMUNITY)
Admission: EM | Admit: 2020-09-29 | Discharge: 2020-09-29 | Disposition: A | Discharge status: Home / Self Care | Payer: Medicaid Other | Attending: Medical Oncology | Admitting: Medical Oncology

## 2020-09-29 ENCOUNTER — Other Ambulatory Visit: Payer: Self-pay

## 2020-09-29 ENCOUNTER — Encounter (HOSPITAL_COMMUNITY): Payer: Self-pay

## 2020-09-29 DIAGNOSIS — Z20822 Contact with and (suspected) exposure to covid-19: Secondary | ICD-10-CM | POA: Diagnosis not present

## 2020-09-29 DIAGNOSIS — R519 Headache, unspecified: Secondary | ICD-10-CM | POA: Diagnosis present

## 2020-09-29 DIAGNOSIS — R17 Unspecified jaundice: Secondary | ICD-10-CM | POA: Diagnosis not present

## 2020-09-29 LAB — HEPATIC FUNCTION PANEL
ALT: 12 U/L (ref 0–44)
AST: 17 U/L (ref 15–41)
Albumin: 3.5 g/dL (ref 3.5–5.0)
Alkaline Phosphatase: 46 U/L (ref 38–126)
Bilirubin, Direct: 0.1 mg/dL (ref 0.0–0.2)
Indirect Bilirubin: 0.7 mg/dL (ref 0.3–0.9)
Total Bilirubin: 0.8 mg/dL (ref 0.3–1.2)
Total Protein: 6.3 g/dL — ABNORMAL LOW (ref 6.5–8.1)

## 2020-09-29 LAB — SARS CORONAVIRUS 2 (TAT 6-24 HRS): SARS Coronavirus 2: NEGATIVE

## 2020-09-29 NOTE — ED Triage Notes (Signed)
Pt presents with ongoing headache for past few days.

## 2020-09-29 NOTE — ED Provider Notes (Addendum)
[facility]    CSN: [id number] Arrival date & time: 09/29/20  1005      History   Chief Complaint Chief Complaint  Patient presents with   Headache    HPI Brooke Lucas is a 35 y.o. female.   HPI  Headache: Pt states that they have had a headache for the past few days. She reports that she gets headaches off and on but because she does not wear her mask at work they wish for her to come in and get a COVID-19 swab. She denies head injury. Has had one episodes of vomiting that looked like last meal and one low grade fever. She has used ibuprofen with some relief of symptoms. She denies visual changes, abdominal pain, neck stiffness, neuro changes. Of note she does have a history of ETOH abuse.   Past Medical History:  Diagnosis Date   Anxiety    Depression    after loss of son   History of kidney stones    Sickle cell trait (HCC)    Trichomonas     Patient Active Problem List   Diagnosis Date Noted   Kidney stone 06/26/2020   Alcohol abuse    Fever    ASCUS with positive high risk HPV cervical 12/19/2019   History of bilateral tubal ligation 07/24/2016   Tobacco abuse 07/24/2016   Unwanted fertility 04/24/2016    Past Surgical History:  Procedure Laterality Date   CYSTOSCOPY W/ URETERAL STENT PLACEMENT Right 06/27/2020   Procedure: CYSTOSCOPY WITH RETROGRADE PYELOGRAM/URETERAL STENT PLACEMENT;  Surgeon: [name], MD;  Location: [facility];  Service: Urology;  Laterality: Right;   CYSTOSCOPY/URETEROSCOPY/HOLMIUM LASER/STENT PLACEMENT Right 07/13/2020   Procedure: CYSTOSCOPY/URETEROSCOPY/HOLMIUM LASER/STENT PLACEMENT;  Surgeon: Sondra Come, MD;  Location: [facility] ORS;  Service: Urology;  Laterality: Right;   extraction wisdom     TUBAL LIGATION N/A 05/22/2016   Procedure: POST PARTUM TUBAL LIGATION;  Surgeon: Hermina Staggers, MD;  Location: [facility] SU[facility];  Service: Gynecology;  Laterality: N/A;    OB History     Gravida  3   Para  3   Term   3   Preterm  0   AB  0   Living  2      SAB  0   IAB  0   Ectopic  0   Multiple  0   Live Births  2            Home Medications    Prior to Admission medications   Medication Sig Start Date End Date Taking? Authorizing Provider  ibuprofen (ADVIL) 200 MG tablet Take 200 mg by mouth every 6 (six) hours as needed for headache or moderate pain.    [provider]  methocarbamol (ROBAXIN) 500 MG tablet Take 1 tablet (500 mg total) by mouth every 8 (eight) hours as needed for muscle spasms. 08/09/20   Petrucelli, Samantha R, PA-C  sulfamethoxazole-trimethoprim (BACTRIM DS) 800-160 MG tablet Take 1 tablet by mouth daily. 07/13/20   Sondra Come, MD  Cetirizine HCl 10 MG CAPS Take 1 capsule (10 mg total) by mouth daily. 11/03/18 08/05/19  Wieters, Junius Creamer, PA-C    Family History Family History  Problem Relation Age of Onset   Cancer Brother    Birth defects Son        heart   Heart disease Maternal Grandmother    Arthritis Paternal Grandmother    Hypertension Mother    Other Father  Gunshot    Social History Social History   Tobacco Use   Smoking status: Every Day    Packs/day: 0.50    Years: 0.00    Pack years: 0.00    Types: Cigarettes    Last attempt to quit: 07/15/2019    Years since quitting: 1.2   Smokeless tobacco: Never  Vaping Use   Vaping Use: Never used  Substance Use Topics   Alcohol use: Yes    Alcohol/week: 0.0 standard drinks    Comment: Socially   Drug use: Yes    Types: Marijuana    Comment: PT IS +UDS 05/22/16     Allergies   Amoxicillin, Penicillins, and Benadryl [diphenhydramine]   Review of Systems Review of Systems  As stated above in HPI Physical Exam Triage Vital Signs ED Triage Vitals  Enc Vitals Group     BP 09/29/20 1022 107/66     Pulse Rate 09/29/20 1022 79     Resp 09/29/20 1022 18     Temp 09/29/20 1022 98.7 F (37.1 C)     Temp Source 09/29/20 1022 Oral     SpO2 09/29/20 1022 100 %      Weight --      Height --      Head Circumference --      Peak Flow --      Pain Score 09/29/20 1024 5     Pain Loc --      Pain Edu? --      Excl. in GC? --    No data found.  Updated Vital Signs BP 107/66 (BP Location: Left Arm)   Pulse 79   Temp 98.7 F (37.1 C) (Oral)   Resp 18   LMP 09/24/2020   SpO2 100%   Physical Exam Vitals and nursing note reviewed.  Constitutional:      General: She is not in acute distress.    Appearance: She is well-developed. She is not ill-appearing, toxic-appearing or diaphoretic.  HENT:     Head: Normocephalic and atraumatic.  Eyes:     General: Scleral icterus present.     Extraocular Movements: Extraocular movements intact.     Pupils: Pupils are equal, round, and reactive to light.  Cardiovascular:     Rate and Rhythm: Normal rate and regular rhythm.     Heart sounds: Normal heart sounds.  Pulmonary:     Effort: Pulmonary effort is normal.     Breath sounds: Normal breath sounds.  Abdominal:     General: There is no distension.     Palpations: Abdomen is soft. There is no mass.     Tenderness: There is no abdominal tenderness. There is no guarding.  Musculoskeletal:     Cervical back: Normal range of motion and neck supple. No rigidity.  Lymphadenopathy:     Cervical: No cervical adenopathy.  Skin:    General: Skin is warm.     Comments: No jaundice  Neurological:     Mental Status: She is alert and oriented to person, place, and time.     Cranial Nerves: No cranial nerve deficit or facial asymmetry.     Sensory: No sensory deficit.     Motor: No weakness.     Deep Tendon Reflexes: Reflexes normal.  Psychiatric:        Mood and Affect: Mood normal.        Speech: Speech normal.        Behavior: Behavior normal.     UC Treatments /  Results  Labs (all labs ordered are listed, but only abnormal results are displayed) Labs Reviewed - No data to display  EKG   Radiology No results found.  Procedures Procedures  (including critical care time)  Medications Ordered in UC Medications - No data to display  Initial Impression / Assessment and Plan / UC Course  I have reviewed the triage vital signs and the nursing notes.  Pertinent labs & imaging results that were available during my care of the patient were reviewed by me and considered in my medical decision making (see chart for details).     New. Swabbing for COVID-19 but also concerned about her scleral icterus.  I discussed my concerns with patient.  She states that she has had yellowing of her eyes for quite some time.  I discussed that for safety I would like to check LFTs to ensure that they are not significantly elevated given her symptoms as this could also be a sign and symptom of hepatic encephalopathy.  As her vitals are currently normal and her exam is normal she would like to avoid ER at this time.  For now rest, protein diet and low dose ibuprofen recommended. Discussed red flag signs and symptoms.    Final Clinical Impressions(s) / UC Diagnoses   Final diagnoses:  None   Discharge Instructions   None    ED Prescriptions   None    PDMP not reviewed this encounter.   Rushie Chestnut, PA-C 09/29/20 1044    [address] [place] 09/29/20 1048

## 2020-10-20 ENCOUNTER — Other Ambulatory Visit: Payer: Self-pay | Admitting: Obstetrics

## 2020-12-03 ENCOUNTER — Ambulatory Visit: Payer: Medicaid Other | Admitting: Advanced Practice Midwife

## 2020-12-03 ENCOUNTER — Ambulatory Visit: Payer: Medicaid Other | Admitting: Obstetrics

## 2021-01-21 ENCOUNTER — Other Ambulatory Visit: Payer: Self-pay | Admitting: *Deleted

## 2021-01-21 MED ORDER — FLUCONAZOLE 150 MG PO TABS: 150.0000 mg | ORAL_TABLET | Freq: Once | ORAL | 0 refills | Status: DC

## 2021-01-21 NOTE — Progress Notes (Signed)
Refill request from pharmacy /

## 2021-02-07 ENCOUNTER — Other Ambulatory Visit: Payer: Self-pay

## 2021-02-07 DIAGNOSIS — B379 Candidiasis, unspecified: Secondary | ICD-10-CM

## 2021-02-07 DIAGNOSIS — B9689 Other specified bacterial agents as the cause of diseases classified elsewhere: Secondary | ICD-10-CM

## 2021-02-07 MED ORDER — FLUCONAZOLE 150 MG PO TABS
150.0000 mg | ORAL_TABLET | Freq: Once | ORAL | 0 refills | Status: AC
Start: 2021-02-07 — End: 2021-02-07

## 2021-02-07 MED ORDER — METRONIDAZOLE 500 MG PO TABS: 500.0000 mg | ORAL_TABLET | Freq: Two times a day (BID) | ORAL | 0 refills | Status: DC

## 2021-04-05 ENCOUNTER — Other Ambulatory Visit: Payer: Self-pay | Admitting: *Deleted

## 2021-04-05 DIAGNOSIS — B3731 Acute candidiasis of vulva and vagina: Secondary | ICD-10-CM

## 2021-04-05 DIAGNOSIS — N390 Urinary tract infection, site not specified: Secondary | ICD-10-CM

## 2021-04-05 DIAGNOSIS — R300 Dysuria: Secondary | ICD-10-CM

## 2021-04-05 MED ORDER — NITROFURANTOIN MONOHYD MACRO 100 MG PO CAPS: 100.0000 mg | ORAL_CAPSULE | Freq: Two times a day (BID) | ORAL | 1 refills | Status: DC

## 2021-04-05 MED ORDER — FLUCONAZOLE 200 MG PO TABS: 200.0000 mg | ORAL_TABLET | Freq: Every day | ORAL | 0 refills | Status: DC

## 2021-04-05 NOTE — Progress Notes (Signed)
TC from patient requesting RX yeast and UTI. Hx of same frequently following menstrual cycle. Dr. Clearance Coots consulted. Orders for 1 time RXs Diflucan and Macrobid. Advised patient that she must schedule overdue annual for further RXs.

## 2021-04-15 ENCOUNTER — Telehealth: Payer: Self-pay

## 2021-04-15 NOTE — Telephone Encounter (Signed)
Returned call and answered questions about rx sent ?

## 2021-10-31 IMAGING — CT CT RENAL STONE PROTOCOL
2 of 4 series · 16 of 46 positions shown, 18 images · non-contrast
Comparison: 06/26/2020

CLINICAL DATA: Left-sided flank pain for several hours, initial
encounter

EXAM:
CT ABDOMEN AND PELVIS WITHOUT CONTRAST
TECHNIQUE: Multidetector CT imaging of the abdomen and pelvis was performed
following the standard protocol without IV contrast.

[Series 3: renal stone 5.0 · axial · 0.72mm/px · z∈[+880,+1290]mm · 13 of 90 slices shown, 15 images]
[im 4/90  soft-tissue]
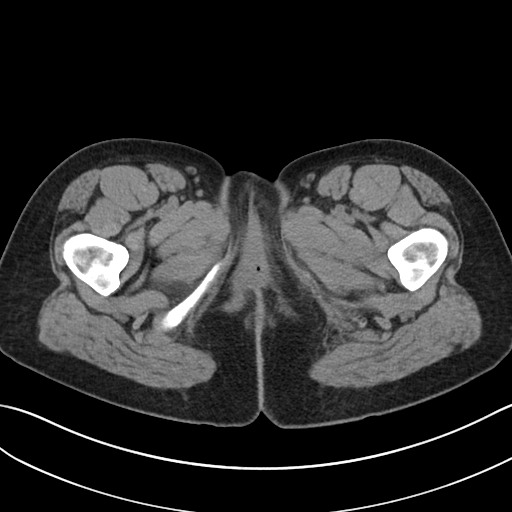
[im 4/90  bone]
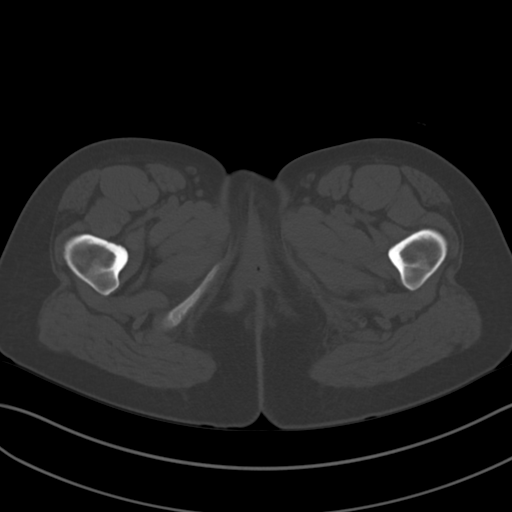
[im 12/90  soft-tissue]
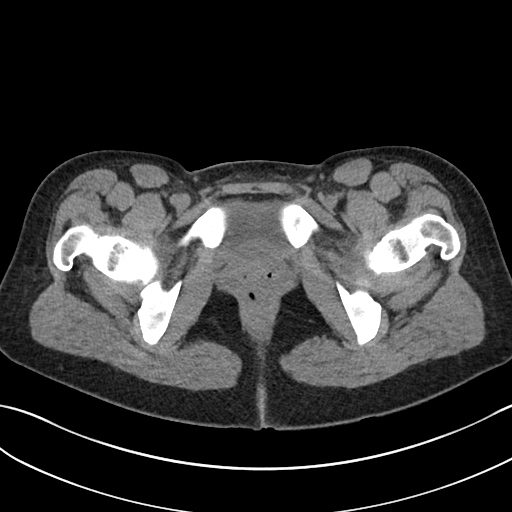
[im 19/90  soft-tissue]
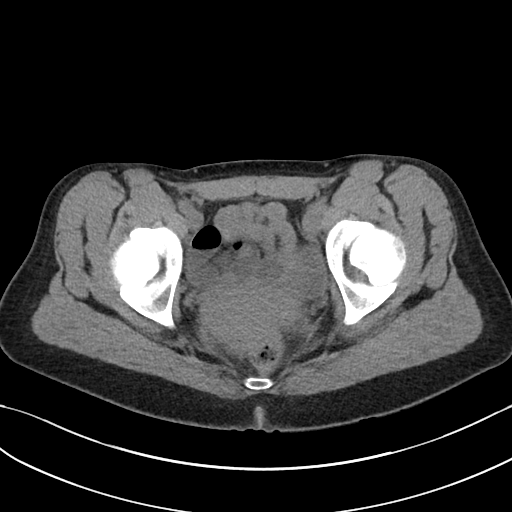
[im 26/90  soft-tissue]
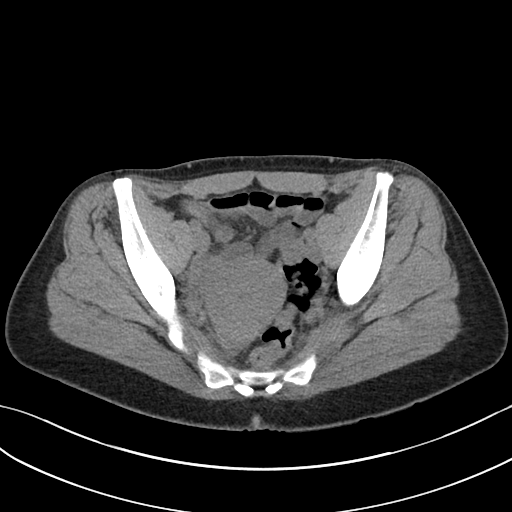
[im 30/90  soft-tissue]
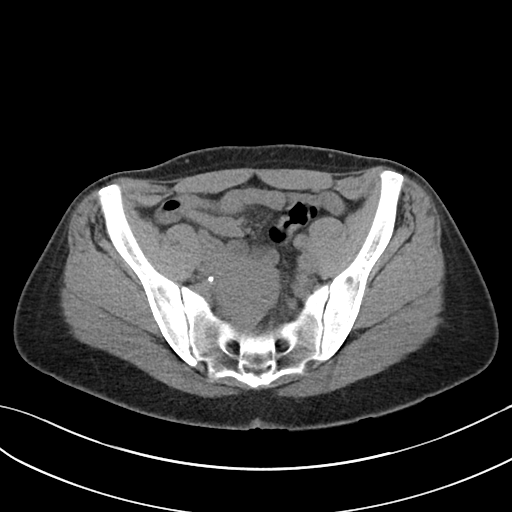
[im 38/90  soft-tissue]
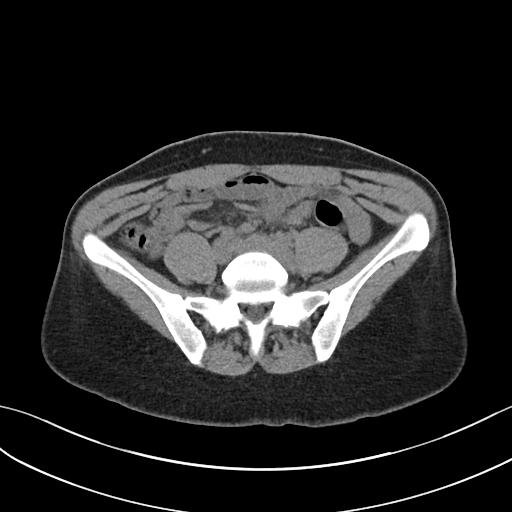
[im 45/90  soft-tissue]
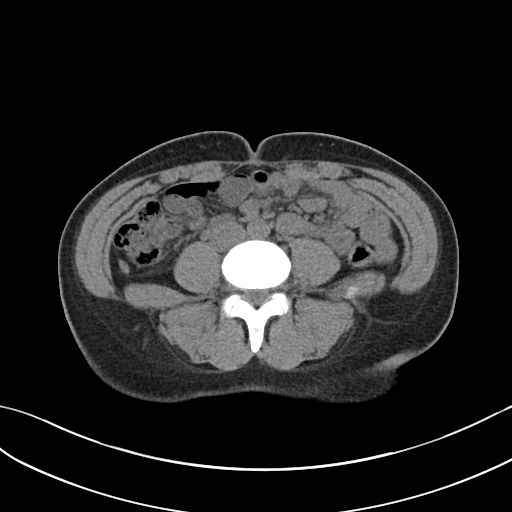
[im 52/90  soft-tissue]
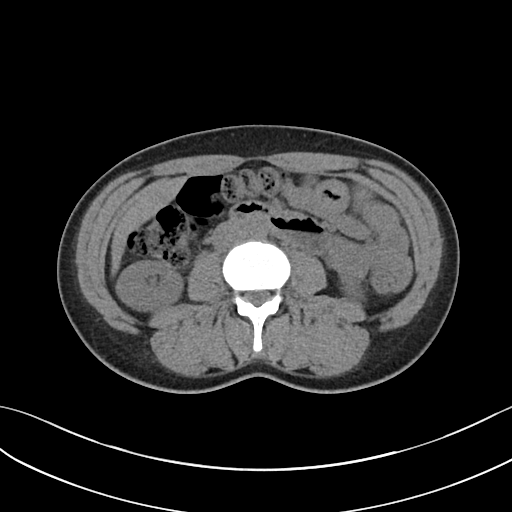
[im 60/90  soft-tissue]
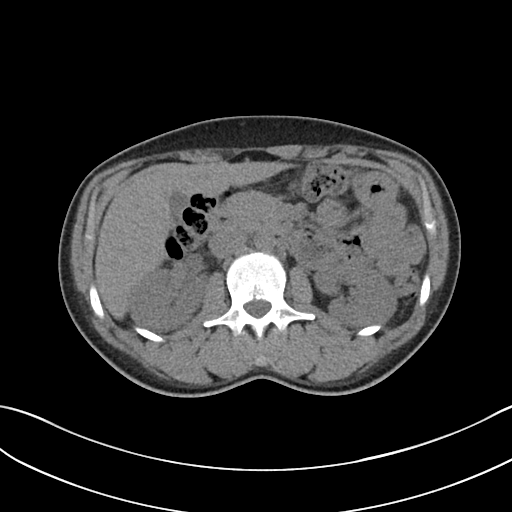
[im 60/90  bone]
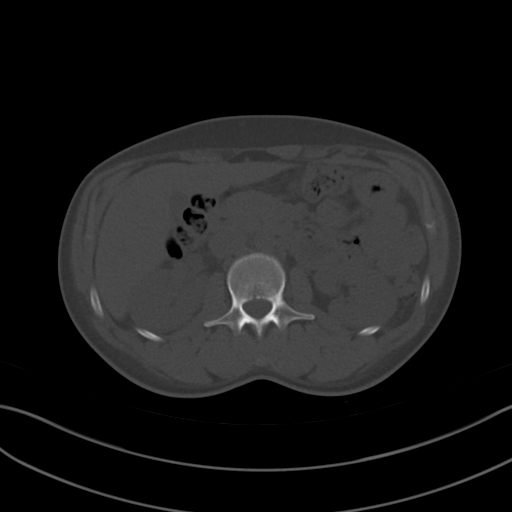
[im 64/90  soft-tissue]
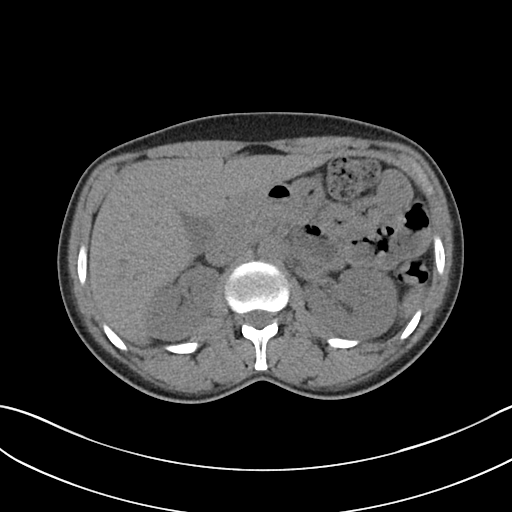
[im 71/90  soft-tissue]
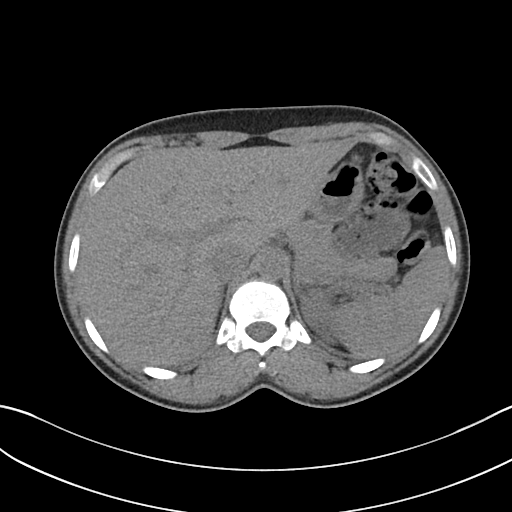
[im 78/90  soft-tissue]
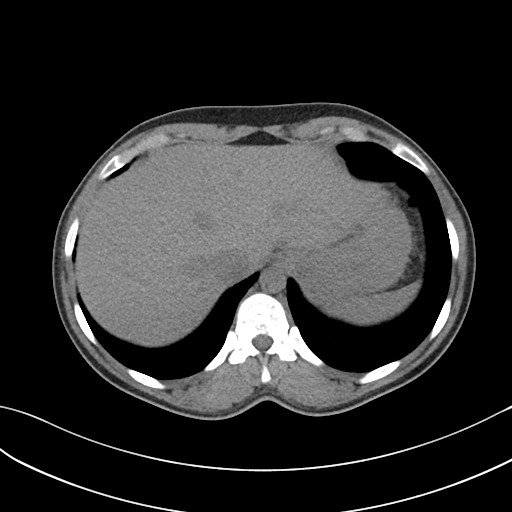
[im 86/90  soft-tissue]
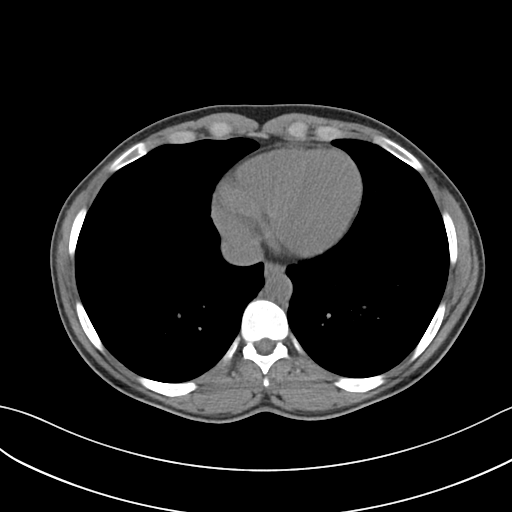

[Series 6: coronal · coronal · 0.74mm/px · 3 of 68 slices shown]
[im 23/68  soft-tissue]
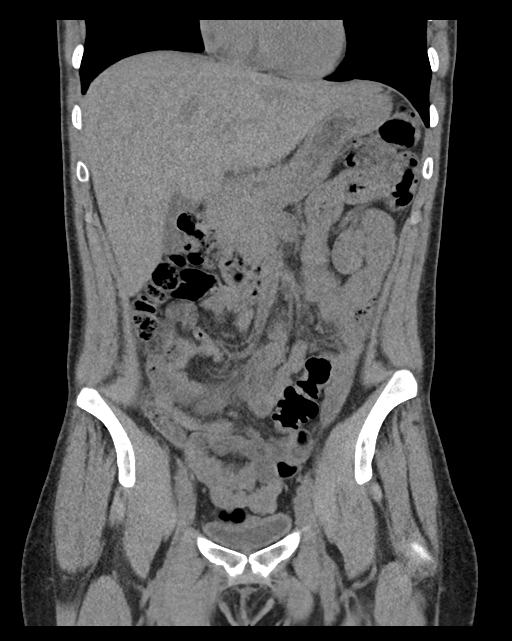
[im 30/68  soft-tissue]
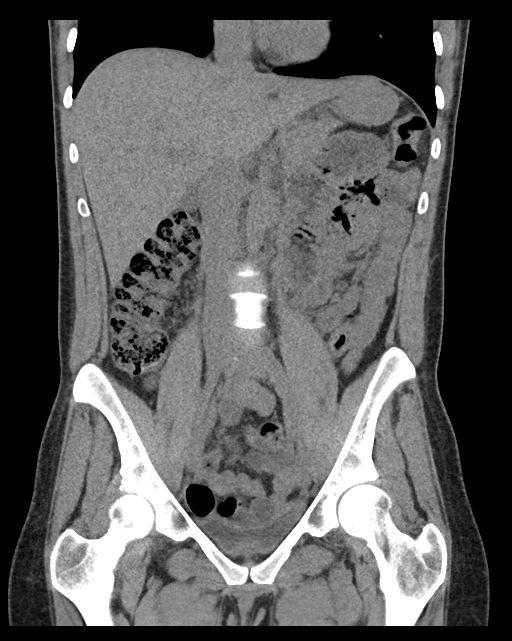
[im 38/68  soft-tissue]
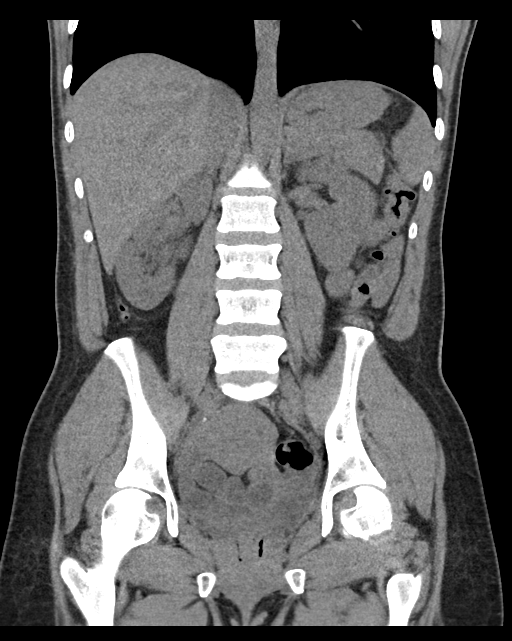

[16 of 46 positions shown; findings below may reference images not displayed]

FINDINGS: Lower chest: No acute abnormality.

Hepatobiliary: No focal liver abnormality is seen. No gallstones,
gallbladder wall thickening, or biliary dilatation.

Pancreas: Unremarkable. No pancreatic ductal dilatation or
surrounding inflammatory changes.

Spleen: Normal in size without focal abnormality.

Adrenals/Urinary Tract: Adrenal glands are within normal limits.
Previously seen right-sided hydronephrosis has resolved. Previously
seen calcification in the expected region of the right mid to distal
ureter is again identified adjacent to the uterus stable in
appearance from the prior exam. The right ureter is better
visualized on today's exam and no obstructive changes are seen. No
renal calculi or ureteral stones are noted. Bladder is decompressed.

Stomach/Bowel: The appendix is well visualized and within normal
limits. No obstructive or inflammatory changes of colon are seen.
The small bowel and stomach are unremarkable.

Vascular/Lymphatic: Aortic atherosclerosis. No enlarged abdominal or
pelvic lymph nodes.

Reproductive: Uterus and bilateral adnexa are unremarkable.

Other: No abdominal wall hernia or abnormality. No abdominopelvic
ascites.

Musculoskeletal: No acute or significant osseous findings.
IMPRESSION: Previously seen right-sided hydronephrosis has resolved. The
calcification noted on the prior exam is again identified and felt
to be extrinsic to the right ureter.

No acute abnormality is noted.

## 2022-01-14 ENCOUNTER — Ambulatory Visit: Payer: Medicaid Other | Admitting: Obstetrics

## 2022-02-20 ENCOUNTER — Other Ambulatory Visit (HOSPITAL_COMMUNITY)
Admission: RE | Admit: 2022-02-20 | Discharge: 2022-02-20 | Disposition: A | Discharge status: Home / Self Care | Payer: Medicaid Other | Source: Ambulatory Visit | Attending: Obstetrics | Admitting: Obstetrics

## 2022-02-20 ENCOUNTER — Ambulatory Visit (INDEPENDENT_AMBULATORY_CARE_PROVIDER_SITE_OTHER): Payer: Medicaid Other | Admitting: Obstetrics

## 2022-02-20 ENCOUNTER — Encounter: Payer: Self-pay | Admitting: Obstetrics

## 2022-02-20 VITALS — BP 111/65 | HR 83 | Ht 67.0 in | Wt 166.0 lb

## 2022-02-20 DIAGNOSIS — Z01411 Encounter for gynecological examination (general) (routine) with abnormal findings: Secondary | ICD-10-CM | POA: Diagnosis not present

## 2022-02-20 DIAGNOSIS — N760 Acute vaginitis: Secondary | ICD-10-CM | POA: Diagnosis not present

## 2022-02-20 DIAGNOSIS — Z01419 Encounter for gynecological examination (general) (routine) without abnormal findings: Secondary | ICD-10-CM | POA: Diagnosis present

## 2022-02-20 DIAGNOSIS — B3731 Acute candidiasis of vulva and vagina: Secondary | ICD-10-CM | POA: Diagnosis not present

## 2022-02-20 DIAGNOSIS — N898 Other specified noninflammatory disorders of vagina: Secondary | ICD-10-CM | POA: Diagnosis not present

## 2022-02-20 DIAGNOSIS — B9689 Other specified bacterial agents as the cause of diseases classified elsewhere: Secondary | ICD-10-CM

## 2022-02-20 DIAGNOSIS — Z113 Encounter for screening for infections with a predominantly sexual mode of transmission: Secondary | ICD-10-CM

## 2022-02-20 DIAGNOSIS — Z720 Tobacco use: Secondary | ICD-10-CM

## 2022-02-20 MED ORDER — IBUPROFEN 800 MG PO TABS: 800.0000 mg | ORAL_TABLET | Freq: Three times a day (TID) | ORAL | 5 refills | Status: AC | PRN

## 2022-02-20 MED ORDER — METRONIDAZOLE 500 MG PO TABS: 500.0000 mg | ORAL_TABLET | Freq: Two times a day (BID) | ORAL | 6 refills | Status: AC

## 2022-02-20 MED ORDER — FLUCONAZOLE 200 MG PO TABS: 200.0000 mg | ORAL_TABLET | ORAL | 2 refills | Status: AC

## 2022-02-20 NOTE — Progress Notes (Signed)
Subjective:        Brooke Lucas is a 37 y.o. female here for a routine exam.  Current complaints: Vaginal discharge.    Personal health questionnaire:  Is patient Ashkenazi Jewish, have a family history of breast and/or ovarian cancer: no Is there a family history of uterine cancer diagnosed at age < 25, gastrointestinal cancer, urinary tract cancer, family member who is a Field seismologist syndrome-associated carrier: no Is the patient overweight and hypertensive, family history of diabetes, personal history of gestational diabetes, preeclampsia or PCOS: no Is patient over 59, have PCOS,  family history of premature CHD under age 44, diabetes, smoke, have hypertension or peripheral artery disease:  no At any time, has a partner hit, kicked or otherwise hurt or frightened you?: no Over the past 2 weeks, have you felt down, depressed or hopeless?: no Over the past 2 weeks, have you felt little interest or pleasure in doing things?:no   Gynecologic History Patient's last menstrual period was 01/14/2022 (approximate). Contraception: tubal ligation Last Pap: 2018. Results were: normal Last mammogram: n/a. 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    Past Medical History:  Diagnosis Date   Anxiety    Depression    after loss of son   History of kidney stones    Sickle cell trait ([place])    Trichomonas     Past Surgical History:  Procedure Laterality Date   CYSTOSCOPY W/ URETERAL STENT PLACEMENT Right 06/27/2020   Procedure: CYSTOSCOPY WITH RETROGRADE  PYELOGRAM/URETERAL STENT PLACEMENT;  Surgeon: Alexis Frock, MD;  Location: [facility];  Service: Urology;  Laterality: Right;   CYSTOSCOPY/URETEROSCOPY/HOLMIUM LASER/STENT PLACEMENT Right 07/13/2020   Procedure: CYSTOSCOPY/URETEROSCOPY/HOLMIUM LASER/STENT PLACEMENT;  Surgeon: Billey Co, MD;  Location: [facility];  Service: Urology;  Laterality: Right;   extraction wisdom     TUBAL LIGATION N/A 05/22/2016   Procedure: POST PARTUM TUBAL LIGATION;  Surgeon: Chancy Milroy, MD;  Location: [facility];  Service: Gynecology;  Laterality: N/A;     Current Outpatient Medications:    ibuprofen (ADVIL) 800 MG tablet, Take 1 tablet (800 mg total) by mouth every 8 (eight) hours as needed., Disp: 30 tablet, Rfl: 5   fluconazole (DIFLUCAN) 150 MG tablet, Take 1 tablet (150 mg total) by mouth once for 1 dose., Disp: 1 tablet, Rfl: 0   fluconazole (DIFLUCAN) 200 MG tablet, Take 1 tablet (200 mg total) by mouth every 3 (three) days for 3 days. Take one tablet every 3 days for 3 doses, Disp: 3 tablet, Rfl: 2   ibuprofen (ADVIL) 200 MG tablet, Take 200 mg by mouth every 6 (six) hours as needed for headache or moderate pain., Disp: , Rfl:    methocarbamol (ROBAXIN) 500 MG tablet, Take 1 tablet (500 mg total) by mouth every 8 (eight) hours as needed for muscle spasms., Disp: 15 tablet, Rfl: 0   metroNIDAZOLE (  FLAGYL) 500 MG tablet, TAKE 1 TABLET BY MOUTH TWICE DAILY, Disp: 14 tablet, Rfl: 5   metroNIDAZOLE (FLAGYL) 500 MG tablet, Take 1 tablet (500 mg total) by mouth 2 (two) times daily., Disp: 14 tablet, Rfl: 6 Allergies  Allergen Reactions   Amoxicillin Anaphylaxis    Has patient had a PCN reaction causing immediate rash, facial/tongue/throat swelling, SOB or lightheadedness with hypotension: Yes Has patient had a PCN reaction causing severe rash involving mucus membranes or skin necrosis: Yes Has patient had a PCN reaction that required hospitalization No Has patient had a PCN reaction occurring within  the last 10 years: No If all of the above answers are "NO", then may proceed with Cephalosporin use.   Penicillins Anaphylaxis    Has patient had a PCN reaction causing immediate rash, facial/tongue/throat swelling, SOB or lightheadedness with hypotension: Yes Has patient had a PCN reaction causing severe rash involving mucus membranes or skin necrosis: Yes Has patient had a PCN reaction that required hospitalization No Has patient had a PCN reaction occurring within the last 10 years: No If all of the above answers are "NO", then may proceed with Cephalosporin use.   Benadryl [Diphenhydramine] Anxiety    Panic, feels like she cant breathing    Social History   Tobacco Use   Smoking status: Every Day    Packs/day: 0.50    Years: 0.00    Total pack years: 0.00    Types: Cigarettes, Cigars    Last attempt to quit: 07/15/2019    Years since quitting: 2.6   Smokeless tobacco: Never  Substance Use Topics   Alcohol use: Yes    Alcohol/week: 0.0 standard drinks of alcohol    Comment: Socially    Family History  Problem Relation Age of Onset   Cancer Brother    Birth defects Son        heart   Heart disease Maternal Grandmother    Arthritis Paternal Grandmother    Hypertension Mother    Other Father        Gunshot      Review of Systems  Constitutional: negative for fatigue and weight loss Respiratory: negative for cough and wheezing Cardiovascular: negative for chest pain, fatigue and palpitations Gastrointestinal: negative for abdominal pain and change in bowel habits Musculoskeletal:negative for myalgias Neurological: negative for gait problems and tremors Behavioral/Psych: negative for abusive relationship, depression Endocrine: negative for temperature intolerance    Genitourinary:negative for abnormal menstrual periods, genital lesions, hot flashes, sexual problems and vaginal discharge Integument/breast: negative for breast lump, breast tenderness, nipple discharge and  skin lesion(s)    Objective:       BP 111/65   Pulse 83   Ht 5\' 7"  (1.702 m)   Wt 166 lb (75.3 kg)   LMP 01/14/2022 (Approximate)   BMI 26.00 kg/m  General:   alert  Skin:   no rash or abnormalities  Lungs:   clear to auscultation bilaterally  Heart:   regular rate and rhythm, S1, S2 normal, no murmur, click, rub or gallop  Breasts:   normal without suspicious masses, skin or nipple changes or axillary nodes  Abdomen:  normal findings: no organomegaly, soft, non-tender and no hernia  Pelvis:  External genitalia: normal general appearance Urinary system: urethral meatus normal and bladder without fullness, nontender Vaginal: normal without tenderness, induration or masses Cervix: normal appearance Adnexa: normal bimanual exam Uterus: anteverted and non-tender, normal size   Lab Review Urine pregnancy test Labs reviewed yes Radiologic studies reviewed  no  I have spent a total of 20 minutes of face-to-face time, excluding clinical staff time, reviewing notes and preparing to see patient, ordering tests and/or medications, and counseling the patient.   Assessment:    1. Encounter for gynecological examination with Papanicolaou smear of cervix Rx: - Cytology - PAP( [facility])  2. Vaginal discharge Rx: - Cervicovaginal ancillary only( [facility])  3. BV (bacterial vaginosis) Rx: - metroNIDAZOLE (FLAGYL) 500 MG tablet; Take 1 tablet (500 mg total) by mouth 2 (two) times daily.  Dispense: 14 tablet; Refill: 6  4. Vaginal yeast infection Rx: - fluconazole (DIFLUCAN) 200 MG tablet; Take 1 tablet (200 mg total) by mouth every 3 (three) days for 3 days. Take one tablet every 3 days for 3 doses  Dispense: 3 tablet; Refill: 2  5. Screening for STD (sexually transmitted disease) Rx: - HIV antibody (with reflex) - Hepatitis C Antibody - Hepatitis B Surface AntiGEN - RPR  6. Tobacco abuse - cessation recommended     Plan:    Education reviewed: calcium supplements,  depression evaluation, low fat, low cholesterol diet, safe sex/STD prevention, self breast exams, smoking cessation, and weight bearing exercise. Follow up in: 1 year.   Meds ordered this encounter  Medications   fluconazole (DIFLUCAN) 200 MG tablet    Sig: Take 1 tablet (200 mg total) by mouth every 3 (three) days for 3 days. Take one tablet every 3 days for 3 doses    Dispense:  3 tablet    Refill:  2   metroNIDAZOLE (FLAGYL) 500 MG tablet    Sig: Take 1 tablet (500 mg total) by mouth 2 (two) times daily.    Dispense:  14 tablet    Refill:  6   ibuprofen (ADVIL) 800 MG tablet    Sig: Take 1 tablet (800 mg total) by mouth every 8 (eight) hours as needed.    Dispense:  30 tablet    Refill:  5   Orders Placed This Encounter  Procedures   HIV antibody (with reflex)   Hepatitis C Antibody   Hepatitis B Surface AntiGEN   RPR    Aunika Kirsten A. Jodi Mourning MD 02/20/2022

## 2022-02-20 NOTE — Progress Notes (Addendum)
36 y.o GYN presents for AEX/PAP/STD screening.  C/o getting BV/UTI's after her period.

## 2022-02-21 LAB — CERVICOVAGINAL ANCILLARY ONLY
Bacterial Vaginitis (gardnerella): POSITIVE — AB
Candida Glabrata: NEGATIVE
Candida Vaginitis: NEGATIVE
Chlamydia: NEGATIVE
Comment: NEGATIVE
Comment: NEGATIVE
Comment: NEGATIVE
Comment: NEGATIVE
Comment: NEGATIVE
Comment: NORMAL
Neisseria Gonorrhea: NEGATIVE
Trichomonas: POSITIVE — AB

## 2022-02-21 LAB — HEPATITIS C ANTIBODY: Hep C Virus Ab: NONREACTIVE

## 2022-02-21 LAB — RPR: RPR Ser Ql: NONREACTIVE

## 2022-02-21 LAB — HEPATITIS B SURFACE ANTIGEN: Hepatitis B Surface Ag: NEGATIVE

## 2022-02-21 LAB — HIV ANTIBODY (ROUTINE TESTING W REFLEX): HIV Screen 4th Generation wRfx: NONREACTIVE

## 2022-02-22 ENCOUNTER — Other Ambulatory Visit: Payer: Self-pay | Admitting: Obstetrics

## 2022-02-24 ENCOUNTER — Telehealth: Payer: Self-pay

## 2022-02-24 NOTE — Telephone Encounter (Signed)
S/w pt and advised of results, rx and treatment plan ?

## 2022-02-25 LAB — CYTOLOGY - PAP
Comment: NEGATIVE
Comment: NEGATIVE
Comment: NEGATIVE
Diagnosis: HIGH — AB
HPV 16: NEGATIVE
HPV 18 / 45: NEGATIVE
High risk HPV: POSITIVE — AB

## 2022-02-26 NOTE — Progress Notes (Signed)
Pt advised of abnormal Pap smear and recommendation for colpo. Pt has had colpo before and expresses understanding of procedure. Call transferred to schedulers.

## 2022-03-20 ENCOUNTER — Ambulatory Visit (INDEPENDENT_AMBULATORY_CARE_PROVIDER_SITE_OTHER): Payer: Medicaid Other | Admitting: Obstetrics and Gynecology

## 2022-03-20 ENCOUNTER — Encounter: Payer: Self-pay | Admitting: Obstetrics and Gynecology

## 2022-03-20 ENCOUNTER — Other Ambulatory Visit (HOSPITAL_COMMUNITY)
Admission: RE | Admit: 2022-03-20 | Discharge: 2022-03-20 | Disposition: A | Discharge status: Home / Self Care | Payer: Medicaid Other | Source: Ambulatory Visit | Attending: Obstetrics and Gynecology | Admitting: Obstetrics and Gynecology

## 2022-03-20 VITALS — BP 107/72 | HR 75 | Wt 165.0 lb

## 2022-03-20 DIAGNOSIS — R87810 Cervical high risk human papillomavirus (HPV) DNA test positive: Secondary | ICD-10-CM

## 2022-03-20 DIAGNOSIS — R87613 High grade squamous intraepithelial lesion on cytologic smear of cervix (HGSIL): Secondary | ICD-10-CM

## 2022-03-20 DIAGNOSIS — Z3202 Encounter for pregnancy test, result negative: Secondary | ICD-10-CM | POA: Diagnosis not present

## 2022-03-20 LAB — POCT URINE PREGNANCY: Preg Test, Ur: NEGATIVE

## 2022-03-20 NOTE — Progress Notes (Signed)
Patient presents for Colposcopy.  Last Pap: 02/20/22 +HPV, HSIL

## 2022-03-20 NOTE — Progress Notes (Signed)
Patient given informed consent, signed copy in the chart, time out was performed. Chart reviewed.  HGSIL noted on pap.  UPT negative.  Acetic acid and lugol's solution placed.  Placed in lithotomy position. Cervix viewed with speculum and colposcope after application of acetic acid.   Colposcopy adequate?  yes Acetowhite lesions? Sporadic and small, concentrated at 12 and 2 o'clock Punctation? no Mosaicism?  no Abnormal vasculature?   no Biopsies? Yes at 12 and 2 o'clock ECC? no  COMMENTS: Patient was given post procedure instructions.    Griffin Basil, MD

## 2022-03-24 LAB — SURGICAL PATHOLOGY

## 2023-09-02 ENCOUNTER — Other Ambulatory Visit (HOSPITAL_COMMUNITY)
Admission: RE | Admit: 2023-09-02 | Discharge: 2023-09-02 | Disposition: A | Discharge status: Home / Self Care | Source: Ambulatory Visit | Attending: Family Medicine | Admitting: Family Medicine

## 2023-09-02 ENCOUNTER — Encounter: Payer: Self-pay | Admitting: Family Medicine

## 2023-09-02 ENCOUNTER — Ambulatory Visit (INDEPENDENT_AMBULATORY_CARE_PROVIDER_SITE_OTHER): Admitting: Family Medicine

## 2023-09-02 VITALS — BP 113/79 | HR 64 | Ht 67.0 in | Wt 162.0 lb

## 2023-09-02 DIAGNOSIS — Z124 Encounter for screening for malignant neoplasm of cervix: Secondary | ICD-10-CM

## 2023-09-02 DIAGNOSIS — N939 Abnormal uterine and vaginal bleeding, unspecified: Secondary | ICD-10-CM | POA: Diagnosis not present

## 2023-09-02 DIAGNOSIS — Z113 Encounter for screening for infections with a predominantly sexual mode of transmission: Secondary | ICD-10-CM | POA: Diagnosis present

## 2023-09-02 LAB — POCT URINE PREGNANCY: Preg Test, Ur: NEGATIVE

## 2023-09-02 NOTE — Progress Notes (Signed)
 Pt presents for prolonged vaginal bleeding that occurred a few weeks ago. Period started on 08/10/23 and ended on 08/27/23. This is the first occurrence. BTL 7 years ago, pulling in abdomen when bleeding occurred. Bleeding was dark for 4 days, then lightened up. Cycle was heavier than normal. Having hot flashes for the past month. Would like all STI testing.

## 2023-09-02 NOTE — Progress Notes (Signed)
 ANNUAL EXAM Patient name: Brooke Lucas MRN [id number]  Date of birth: 1985-06-26 Chief Complaint:   Vaginal Bleeding  History of Present Illness:   Brooke Lucas is a 38 y.o. [phone] female being seen today for a routine annual exam.  Current complaints:   Menses ongoing x 1 month, reports cycle started around 6/30, went on for one week, then stopped briefly before restarting. Has had heavier cycles since she had her BTL. No VB today. The last time she saw VB, it was dark red. Never had passage of large clots. No LH, dizziness. Prior to this episode, periods have been regular -- every month, lasting 5-7 days.  Interested in STI testing today.   Patient's last menstrual period was 08/10/2023 (exact date).    Last pap 2024. Results were: HSIL w/ HRHPV positive: other (not 16, 18/45). H/O abnormal pap: yes; had subsequent colposcopy showing LSIL, rec for repeat pap in 1 yr Last mammogram: never. Results were: N/A. Family h/o breast cancer: no Last colonoscopy: never. Results were: N/A. Family h/o colorectal cancer: no     02/20/2022    3:30 PM 07/03/2020   10:01 AM 03/06/2016    1:48 PM  Depression screen PHQ 2/9  Decreased Interest 0 0 0  Down, Depressed, Hopeless 0 0 0  PHQ - 2 Score 0 0 0  Altered sleeping 0 0   Tired, decreased energy 0 0   Change in appetite 0 0   Feeling bad or failure about yourself  0 0   Trouble concentrating 0 0   Moving slowly or fidgety/restless 0 0   Suicidal thoughts 0 0   PHQ-9 Score 0 0   Difficult doing work/chores Not difficult at all           No data to display           Review of Systems:   Pertinent items are noted in HPI Denies any headaches, blurred vision, fatigue, shortness of breath, chest pain, abdominal pain, abnormal vaginal discharge/itching/odor/irritation, problems with periods, bowel movements, urination, or intercourse unless otherwise stated above. Pertinent History Reviewed:  Reviewed past medical,surgical,  social and family history.  Reviewed problem list, medications and allergies. Physical Assessment:   Vitals:   09/02/23 1003  BP: 113/79  Pulse: 64  Weight: 162 lb (73.5 kg)  Height: 5' 7 (1.702 m)  Body mass index is 25.37 kg/m.        Physical Examination:   General appearance - well appearing, and in no distress  Mental status - alert, oriented to person, place, and time  Psych:  She has a normal mood and affect  Skin - warm and dry, normal color, no suspicious lesions noted  Chest - effort normal, all lung fields clear to auscultation bilaterally  Heart - normal rate and regular rhythm  Neck:  midline trachea, no thyromegaly or nodules  Breasts - breasts appear normal, no suspicious masses, no skin or nipple changes or  axillary nodes  Abdomen - soft, nontender, nondistended, no masses or organomegaly  Pelvic - VULVA: normal appearing vulva with no masses, tenderness or lesions  VAGINA: normal appearing vagina with normal color and discharge, no lesions  CERVIX: normal appearing cervix without discharge or lesions, no CMT  Thin prep pap is done with HR HPV cotesting  UTERUS: uterus is felt to be normal size, shape, consistency and nontender   ADNEXA: No adnexal masses or tenderness noted.  Extremities:  No swelling or varicosities noted  Chaperone present for exam  Results for orders placed or performed in visit on 09/02/23 (from the past 24 hours)  POCT urine pregnancy   Collection Time: 09/02/23 10:52 AM  Result Value Ref Range   Preg Test, Ur Negative Negative    Assessment & Plan:  Abnormal vaginal bleeding - Plan: POCT urine pregnancy, CBC w/Diff, Thyroid  Panel With TSH, HgB A1c, Comp Met (CMET)  Will evaluate further w labs -- A1c, TSH  Discussed w pt if this is a recurring pattern, would consider further w/up, but may be related to life stressors  Cervical cancer screening - Plan: Cervicovaginal ancillary only  Encntr screen for infections w sexl mode of  transmiss - Plan: RPR, HIV Antibody (routine testing w rflx), Hepatitis C antibody, Cytology - PAP, Hepatitis B surface antigen   Labs/procedures today: Pap  Mammogram: @ 37yo, or sooner if problems Colonoscopy: @ 37yo, or sooner if problems  Orders Placed This Encounter  Procedures   RPR   HIV Antibody (routine testing w rflx)   Hepatitis C antibody   Hepatitis B surface antigen   CBC w/Diff   Thyroid  Panel With TSH   HgB A1c   Comp Met (CMET)   POCT urine pregnancy    Meds: No orders of the defined types were placed in this encounter.   Follow-up: Return in about 6 weeks (around 10/14/2023) for F/up vaginal bleeding, Telehealth or virtual GYN visit.  Alain Sor, MD 09/02/2023 3:13 PM

## 2023-09-03 LAB — CBC WITH DIFFERENTIAL/PLATELET
Basophils Absolute: 0 x10E3/uL (ref 0.0–0.2)
Basos: 1 %
EOS (ABSOLUTE): 0.1 x10E3/uL (ref 0.0–0.4)
Eos: 2 %
Hematocrit: 41.5 % (ref 34.0–46.6)
Hemoglobin: 13.5 g/dL (ref 11.1–15.9)
Immature Grans (Abs): 0 x10E3/uL (ref 0.0–0.1)
Immature Granulocytes: 0 %
Lymphocytes Absolute: 1.7 x10E3/uL (ref 0.7–3.1)
Lymphs: 41 %
MCH: 30.3 pg (ref 26.6–33.0)
MCHC: 32.5 g/dL (ref 31.5–35.7)
MCV: 93 fL (ref 79–97)
Monocytes Absolute: 0.2 x10E3/uL (ref 0.1–0.9)
Monocytes: 6 %
Neutrophils Absolute: 2.1 x10E3/uL (ref 1.4–7.0)
Neutrophils: 50 %
Platelets: 274 x10E3/uL (ref 150–450)
RBC: 4.46 x10E6/uL (ref 3.77–5.28)
RDW: 13.3 % (ref 11.7–15.4)
WBC: 4.1 x10E3/uL (ref 3.4–10.8)

## 2023-09-03 LAB — COMPREHENSIVE METABOLIC PANEL WITH GFR
ALT: 12 IU/L (ref 0–32)
AST: 16 IU/L (ref 0–40)
Albumin: 4.5 g/dL (ref 3.9–4.9)
Alkaline Phosphatase: 58 IU/L (ref 44–121)
BUN/Creatinine Ratio: 9 (ref 9–23)
BUN: 9 mg/dL (ref 6–20)
Bilirubin Total: 0.5 mg/dL (ref 0.0–1.2)
CO2: 21 mmol/L (ref 20–29)
Calcium: 9 mg/dL (ref 8.7–10.2)
Chloride: 102 mmol/L (ref 96–106)
Creatinine, Ser: 0.95 mg/dL (ref 0.57–1.00)
Globulin, Total: 2.2 g/dL (ref 1.5–4.5)
Glucose: 87 mg/dL (ref 70–99)
Potassium: 4 mmol/L (ref 3.5–5.2)
Sodium: 137 mmol/L (ref 134–144)
Total Protein: 6.7 g/dL (ref 6.0–8.5)
eGFR: 79 mL/min/1.73 (ref >59)

## 2023-09-03 LAB — HIV ANTIBODY (ROUTINE TESTING W REFLEX): HIV Screen 4th Generation wRfx: NONREACTIVE

## 2023-09-03 LAB — THYROID PANEL WITH TSH
Free Thyroxine Index: 2.1 (ref 1.2–4.9)
T3 Uptake Ratio: 29 % (ref 24–39)
T4, Total: 7.4 ug/dL (ref 4.5–12.0)
TSH: 0.93 u[IU]/mL (ref 0.450–4.500)

## 2023-09-03 LAB — CERVICOVAGINAL ANCILLARY ONLY
Chlamydia: NEGATIVE
Comment: NEGATIVE
Comment: NEGATIVE
Comment: NORMAL
Neisseria Gonorrhea: NEGATIVE
Trichomonas: NEGATIVE

## 2023-09-03 LAB — RPR: RPR Ser Ql: NONREACTIVE

## 2023-09-03 LAB — HEMOGLOBIN A1C
Est. average glucose Bld gHb Est-mCnc: 111 mg/dL
Hgb A1c MFr Bld: 5.5 % (ref 4.8–5.6)

## 2023-09-03 LAB — HEPATITIS C ANTIBODY: Hep C Virus Ab: NONREACTIVE

## 2023-09-03 LAB — HEPATITIS B SURFACE ANTIGEN: Hepatitis B Surface Ag: NEGATIVE

## 2023-09-04 ENCOUNTER — Ambulatory Visit: Payer: Self-pay | Admitting: Family Medicine

## 2023-09-04 LAB — CYTOLOGY - PAP
Chlamydia: NEGATIVE
Comment: NEGATIVE
Comment: NORMAL
Diagnosis: HIGH — AB
Neisseria Gonorrhea: NEGATIVE

## 2023-09-23 ENCOUNTER — Encounter: Admitting: Obstetrics and Gynecology

## 2023-10-09 ENCOUNTER — Other Ambulatory Visit (HOSPITAL_COMMUNITY)
Admission: RE | Admit: 2023-10-09 | Discharge: 2023-10-09 | Disposition: A | Discharge status: Home / Self Care | Source: Ambulatory Visit | Attending: Obstetrics and Gynecology | Admitting: Obstetrics and Gynecology

## 2023-10-09 ENCOUNTER — Ambulatory Visit (INDEPENDENT_AMBULATORY_CARE_PROVIDER_SITE_OTHER): Admitting: Obstetrics and Gynecology

## 2023-10-09 VITALS — BP 116/72 | HR 82 | Ht 67.0 in | Wt 157.8 lb

## 2023-10-09 DIAGNOSIS — Z3202 Encounter for pregnancy test, result negative: Secondary | ICD-10-CM | POA: Diagnosis not present

## 2023-10-09 DIAGNOSIS — R87613 High grade squamous intraepithelial lesion on cytologic smear of cervix (HGSIL): Secondary | ICD-10-CM

## 2023-10-09 DIAGNOSIS — R6889 Other general symptoms and signs: Secondary | ICD-10-CM | POA: Insufficient documentation

## 2023-10-09 LAB — POCT URINE PREGNANCY: Preg Test, Ur: NEGATIVE

## 2023-10-09 NOTE — Progress Notes (Signed)
 Pt presents for colpo. Pt has no questions or concerns at this time.

## 2023-10-09 NOTE — Progress Notes (Signed)
 GYNECOLOGY CLINIC COLPOSCOPY PROCEDURE NOTE  38 y.o. [id number] here for colposcopy for pap finding of:  Result Date Procedure Results Follow-ups  09/02/2023 Cytology - PAP Neisseria Gonorrhea: Negative Chlamydia: Negative Adequacy: Satisfactory for evaluation; transformation zone component PRESENT. 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 Comment: Tissue studies are recommended. 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 CERVIX, 12 AND2 O'CLOCK, BIOPSY: -  Fragment...   02/20/2022 Cytology - PAP( [facility]) High risk HPV: Positive (A) HPV 16: Negative HPV 18 / 45: Negative Adequacy: Satisfactory for evaluation; transformation zone component PRESENT. 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 ENDOCERVICAL, CURETTAGE: - Scant fragments...   12/01/2019 Cytology - PAP( [facility]) High risk HPV: Positive (A) HPV 16: Negative HPV 18 / 45: Negative Adequacy: Satisfactory for evaluation; transformation zone component PRESENT. Diagnosis: -  Atypical squamous cells of undetermined significance (ASC-US ) (A) Microorganisms: Shift in flora suggestive of bacterial vaginosis Comment: Normal Reference Range HPV - Negative Comment: Normal Reference Range HPV 16 18 45 -Negative   07/24/2016 Cytology - PAP Adequacy: Satisfactory for evaluation  endocervical/transformation zone component PRESENT. Diagnosis: NEGATIVE FOR INTRAEPITHELIAL LESIONS OR MALIGNANCY. BENIGN REACTIVE/REPARATIVE CHANGES. Material Submitted: CervicoVaginal Pap [ThinPrep Imaged] CYTOLOGY - PAP: PAP RESULT   10/30/2015 Pap IG w/ reflex to HPV when ASC-U DIAGNOSIS:: Comment Specimen adequacy:: Comment Clinician Provided ICD10: Comment Performed by:: Comment PAP Smear Comment: . Note:: Comment Test Methodology: Comment PAP Reflex: Comment   06/14/2014 Pap IG w/ reflex to HPV when ASC-U Specimen adequacy:: SEE NOTE FINAL DIAGNOSIS:: SEE NOTE COMMENTS:: SEE NOTE Cytotechnologist:: SEE NOTE   05/05/2013 Pap IG w/ reflex to HPV when ASC-U Specimen adequacy:: SEE NOTE FINAL DIAGNOSIS:: SEE NOTE COMMENTS:: SEE NOTE Cytotechnologist:: SEE NOTE     Discussed role for HPV in cervical dysplasia, need for surveillance, nature of the procedure, and risks and benefits.  Pregnancy test: Lab Results  Component Value Date   PREGTESTUR Negative 10/09/2023    Allergies  Allergen Reactions   Amoxicillin Anaphylaxis    Has patient had a PCN reaction causing immediate rash, facial/tongue/throat swelling, SOB or lightheadedness with hypotension: Yes Has patient had a PCN reaction causing severe rash involving mucus membranes or skin necrosis: Yes Has patient had a PCN reaction that required hospitalization No Has patient had a PCN reaction occurring within the last 10 years: No If all of the above answers are NO, then may proceed with Cephalosporin use.   Penicillins Anaphylaxis    Has patient had a PCN reaction causing immediate rash, facial/tongue/throat swelling, SOB or  lightheadedness with hypotension: Yes Has patient had a PCN reaction causing severe rash involving mucus membranes or skin necrosis: Yes Has patient had a PCN reaction that required hospitalization  No Has patient had a PCN reaction occurring within the last 10 years: No If all of the above answers are NO, then may proceed with Cephalosporin use.   Benadryl  [Diphenhydramine ] Anxiety    Panic, feels like she cant breathing    Patient given informed consent, signed copy in the chart, time out was performed.    Placed in lithotomy position. Cervix viewed with speculum and colposcope after application of acetic acid and lugol's solution.   Colposcopy Adequacy Cervix fully visualized: Yes  SCJ fully visualized: Yes  Colposcopy Findings no mosaicism, no punctation, no abnormal vasculature, and visible lesion(s) at 2-3  o'clock when visualized with lugol's solution.  Corresponding biopsies were obtained.    ECC specimen was obtained.  All specimens were labeled and sent to pathology.  Hemostatic measures: Monsel's solution  Complications: none  Patient tolerated the procedure well.  OBGyn Exam  Colposcopy Impressions Low grade features  Plan Anticipate repap in 1 year if CIN 1, if findings suspicious for HGSIL, proceed with LEEP.  Patient was given post procedure instructions.  Will follow up pathology and manage accordingly; patient will be contacted with results and recommendations.  Routine preventative health maintenance measures emphasized.   Jerilynn DELENA Buddle, MD

## 2023-10-13 LAB — SURGICAL PATHOLOGY

## 2023-10-14 ENCOUNTER — Ambulatory Visit: Payer: Self-pay | Admitting: Obstetrics and Gynecology

## 2023-10-14 ENCOUNTER — Telehealth: Admitting: Obstetrics & Gynecology

## 2023-10-14 VITALS — Ht 67.0 in | Wt 157.0 lb

## 2023-10-14 DIAGNOSIS — R87613 High grade squamous intraepithelial lesion on cytologic smear of cervix (HGSIL): Secondary | ICD-10-CM

## 2023-10-14 NOTE — Progress Notes (Signed)
 Pt presents for f/u. Pt has no questions or concerns at this time.

## 2023-10-14 NOTE — Progress Notes (Signed)
 TELEHEALTH GYNECOLOGY VISIT ENCOUNTER NOTE  Provider location: Center for [facility] at [facility]   Patient location: Home  I connected with Brooke Lucas on 10/14/23 at  8:35 AM EDT by telephone and verified that I am speaking with the correct person using two identifiers. Patient was unable to do MyChart audiovisual encounter due to technical difficulties, she tried several times.    I discussed the limitations, risks, security and privacy concerns of performing an evaluation and management service by telephone and the availability of in person appointments. I also discussed with the patient that there may be a patient responsible charge related to this service. The patient expressed understanding and agreed to proceed.   History:  Brooke Lucas is a 38 y.o. [phone] female being evaluated today for discussion of her cx biopsy result from 8/29 by Dr Zina. She denies any abnormal vaginal discharge, bleeding, pelvic pain or other concerns.       Past Medical History:  Diagnosis Date   Anxiety    Depression    after loss of son   History of kidney stones    Sickle cell trait (HCC)    Trichomonas    Past Surgical History:  Procedure Laterality Date   CYSTOSCOPY W/ URETERAL STENT PLACEMENT Right 06/27/2020   Procedure: CYSTOSCOPY WITH RETROGRADE PYELOGRAM/URETERAL STENT PLACEMENT;  Surgeon: Alvaro Hummer, MD;  Location: [facility] OR;  Service: Urology;  Laterality: Right;   CYSTOSCOPY/URETEROSCOPY/HOLMIUM LASER/STENT PLACEMENT Right 07/13/2020   Procedure: CYSTOSCOPY/URETEROSCOPY/HOLMIUM LASER/STENT PLACEMENT;  Surgeon: Francisca Redell BROCKS, MD;  Location: [facility] ORS;  Service: Urology;  Laterality: Right;   extraction wisdom     TUBAL LIGATION N/A 05/22/2016   Procedure: POST PARTUM TUBAL LIGATION;  Surgeon: Ozell LITTIE Cowman, MD;  Location: [facility] BIRTHING SUITES;  Service: Gynecology;  Laterality: N/A;   The following portions of the patient's history were reviewed and updated as  appropriate: allergies, current medications, past family history, past medical history, past social history, past surgical history and problem list.   Health Maintenance: Collected: 10/09/23 1026  Lab status: Final-Edited  Resulting lab: [place] [facility]  Value: SURGICAL PATHOLOGY CASE: [phone] PATIENT: Brooke Lucas Surgical Pathology Report     Clinical History: abnormal finding on screening procedure, HGSIL (cm)     FINAL MICROSCOPIC DIAGNOSIS:  A. 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  B. ENDOCERVIX, CURETTAGE: - Mucin and admixed inflammatory cells. - No epithelium present.    GROSS DESCRIPTION:  Specimen A: Received in formalin is a pink-red soft tissue fragment that is submitted in toto.  Size: 0.3 cm, 1 block submitted.  Specimen B: Received in formalin is scant pink-red soft material, submitted 1 block.  SW 10/09/2023  Final Diagnosis performed by Wally Highland, MD.   Electronically signed 10/13/2023   Review of Systems:  Pertinent items noted in HPI and remainder of comprehensive ROS otherwise negative.  Physical Exam:   General:  Alert, oriented and cooperative.   Mental Status: Normal mood and affect perceived. Normal judgment and thought content.  Physical exam deferred due to nature of the encounter  Labs and Imaging Results for orders placed or performed in visit on 10/09/23 (from the past 2 weeks)  POCT urine pregnancy   Collection Time: 10/09/23  9:54 AM  Result Value Ref Range   Preg Test, Ur Negative Negative  Surgical pathology   Collection Time: 10/09/23 10:26 AM  Result Value Ref Range   SURGICAL PATHOLOGY      SURGICAL PATHOLOGY CASE: [phone] PATIENT: Brooke Lucas Surgical Pathology Report     Clinical History: abnormal finding on screening  procedure, HGSIL (cm)     FINAL MICROSCOPIC DIAGNOSIS:  A. 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  B. ENDOCERVIX, CURETTAGE: - Mucin and admixed inflammatory cells. - No epithelium present.    GROSS DESCRIPTION:  Specimen A: Received in formalin is a pink-red soft tissue fragment that is submitted in toto.  Size: 0.3 cm, 1 block submitted.  Specimen B: Received in formalin is scant pink-red soft material, submitted 1 block.  SW 10/09/2023  Final Diagnosis performed by Wally Highland, MD.   Electronically signed 10/13/2023 Technical and / or Professional components performed at [facility]. [facility], [address] [place], [place] [address].  I mmunohistochemistry Technical component (if applicable) was performed at [facility]. [address], [address], [place], [place] [address].   IMMUNOHISTOCHEMISTRY DISCLAIMER (if applicable): Some of these immunohistochemical stains may have been developed and the performance characteristics determine by [facility]. Some may not have been cleared or approved by the U.S. Food and Drug Administration. The FDA has determined that such clearance or approval is not necessary. This test is used for clinical purposes. It should not be regarded as investigational or for research. This laboratory is certified under the Clinical Laboratory Improvement Amendments of 1988 (CLIA-88) as qualified to perform high complexity clinical laboratory testing.  The controls stained appropriately.   IHC stains are performed on formalin fixed, paraffin embedded tissue using a 3,3diaminobenzidine (DAB) chromogen and Leica Bond Autostainer System. The staining intens ity of the nucleus is score manually and is reported as the percentage of tumor cell nuclei  demonstrating specific nuclear staining. The specimens are fixed in 10% Neutral Formalin for at least 6 hours and up to 72hrs. These tests are validated on decalcified tissue. Results should be interpreted with caution given the possibility of false negative results on decalcified specimens. Antibody Clones are as follows ER-clone [id number], PR-clone 16, Ki67- clone MM1. Some of these immunohistochemical stains may have been developed and the performance characteristics determined by [facility].    No results found.    Assessment and Plan:     High grade squamous intraepithelial lesion (HGSIL) on cytologic smear of cervix       I discussed the assessment and treatment plan with the patient. The patient was provided an opportunity to ask questions and all were answered. She will be scheduled for LEEP due to HGSIL on Bx. The patient agreed with the plan and demonstrated an understanding of the instructions.     I provided 15 minutes of non-face-to-face time during this encounter.   Lynwood Solomons, MD Center for [facility], [facility]

## 2023-11-19 ENCOUNTER — Other Ambulatory Visit (HOSPITAL_COMMUNITY)
Admission: RE | Admit: 2023-11-19 | Discharge: 2023-11-19 | Disposition: A | Discharge status: Home / Self Care | Source: Ambulatory Visit | Attending: Obstetrics & Gynecology | Admitting: Obstetrics & Gynecology

## 2023-11-19 ENCOUNTER — Encounter: Payer: Self-pay | Admitting: Obstetrics & Gynecology

## 2023-11-19 ENCOUNTER — Ambulatory Visit: Admitting: Obstetrics & Gynecology

## 2023-11-19 VITALS — BP 109/70 | HR 98 | Ht 67.0 in | Wt 159.0 lb

## 2023-11-19 DIAGNOSIS — R87613 High grade squamous intraepithelial lesion on cytologic smear of cervix (HGSIL): Secondary | ICD-10-CM | POA: Diagnosis present

## 2023-11-19 DIAGNOSIS — Z3202 Encounter for pregnancy test, result negative: Secondary | ICD-10-CM

## 2023-11-19 LAB — POCT URINE PREGNANCY: Preg Test, Ur: NEGATIVE

## 2023-11-19 NOTE — Progress Notes (Signed)
 Patient ID: Brooke Lucas, female   DOB: 09/01/1985, 38 y.o.   MRN: [id number]  Chief Complaint  Patient presents with   LEEP    HPI Brooke Lucas is a 38 y.o. female.  [id number] Patient's last menstrual period was 11/09/2023 (exact date). Bx 08/2023 showed CIN2-3 HPI  Indications: Pap smear on July 2025 showed: high-grade squamous intraepithelial neoplasia  (HGSIL-encompassing moderate and severe dysplasia). Previous colposcopy: CIN 2, CIN 3, and in 08/2023. Prior cervical treatment: no treatment.  Past Medical History:  Diagnosis Date   Anxiety    Depression    after loss of son   History of kidney stones    Sickle cell trait    Trichomonas     Past Surgical History:  Procedure Laterality Date   CYSTOSCOPY W/ URETERAL STENT PLACEMENT Right 06/27/2020   Procedure: CYSTOSCOPY WITH RETROGRADE PYELOGRAM/URETERAL STENT PLACEMENT;  Surgeon: Alvaro Hummer, MD;  Location: [facility] OR;  Service: Urology;  Laterality: Right;   CYSTOSCOPY/URETEROSCOPY/HOLMIUM LASER/STENT PLACEMENT Right 07/13/2020   Procedure: CYSTOSCOPY/URETEROSCOPY/HOLMIUM LASER/STENT PLACEMENT;  Surgeon: Francisca Redell BROCKS, MD;  Location: [facility] ORS;  Service: Urology;  Laterality: Right;   extraction wisdom     TUBAL LIGATION N/A 05/22/2016   Procedure: POST PARTUM TUBAL LIGATION;  Surgeon: Ozell LITTIE Cowman, MD;  Location: [facility]IRTHING SUIT[facility];  Service: Gynecology;  Laterality: N/A;    Family History  Problem Relation Age of Onset   Cancer Brother    Birth defects Son        heart   Heart disease Maternal Grandmother    Arthritis Paternal Grandmother    Hypertension Mother    Other Father        Gunshot    Social History Social History   Tobacco Use   Smoking status: Every Day    Current packs/day: 0.00    Types: Cigarettes, Cigars    Start date: 07/15/2019    Last attempt to quit: 07/15/2019    Years since quitting: 4.3   Smokeless tobacco: Never  Vaping Use   Vaping status: Never Used  Substance Use Topics    Alcohol use: Yes    Alcohol/week: 0.0 standard drinks of alcohol    Comment: Socially   Drug use: Yes    Types: Marijuana    Comment: PT IS +UDS 05/22/16    Allergies  Allergen Reactions   Amoxicillin Anaphylaxis    Has patient had a PCN reaction causing immediate rash, facial/tongue/throat swelling, SOB or lightheadedness with hypotension: Yes Has patient had a PCN reaction causing severe rash involving mucus membranes or skin necrosis: Yes Has patient had a PCN reaction that required hospitalization No Has patient had a PCN reaction occurring within the last 10 years: No If all of the above answers are NO, then may proceed with Cephalosporin use.   Penicillins Anaphylaxis    Has patient had a PCN reaction causing immediate rash, facial/tongue/throat swelling, SOB or lightheadedness with hypotension: Yes Has patient had a PCN reaction causing severe rash involving mucus membranes or skin necrosis: Yes Has patient had a PCN reaction that required hospitalization No Has patient had a PCN reaction occurring within the last 10 years: No If all of the above answers are NO, then may proceed with Cephalosporin use.   Benadryl  [Diphenhydramine ] Anxiety    Panic, feels like she cant breathing    Current Outpatient Medications  Medication Sig Dispense Refill   fluconazole  (DIFLUCAN ) 150 MG tablet Take 1 tablet (150 mg total) by mouth once for 1  dose. (Patient not taking: Reported on 10/14/2023) 1 tablet 0   fluconazole  (DIFLUCAN ) 200 MG tablet Take 1 tablet (200 mg total) by mouth every 3 (three) days for 3 days. Take one tablet every 3 days for 3 doses (Patient not taking: Reported on 10/14/2023) 3 tablet 2   ibuprofen  (ADVIL ) 200 MG tablet Take 200 mg by mouth every 6 (six) hours as needed for headache or moderate pain. (Patient not taking: Reported on 10/14/2023)     ibuprofen  (ADVIL ) 800 MG tablet Take 1 tablet (800 mg total) by mouth every 8 (eight) hours as needed. (Patient not taking:  Reported on 10/14/2023) 30 tablet 5   methocarbamol  (ROBAXIN ) 500 MG tablet Take 1 tablet (500 mg total) by mouth every 8 (eight) hours as needed for muscle spasms. (Patient not taking: Reported on 10/14/2023) 15 tablet 0   metroNIDAZOLE  (FLAGYL ) 500 MG tablet TAKE 1 TABLET BY MOUTH TWICE DAILY (Patient not taking: Reported on 10/14/2023) 14 tablet 5   metroNIDAZOLE  (FLAGYL ) 500 MG tablet Take 1 tablet (500 mg total) by mouth 2 (two) times daily. (Patient not taking: Reported on 10/14/2023) 14 tablet 6   No current facility-administered medications for this visit.    Review of Systems Review of Systems  Genitourinary:  Negative for pelvic pain, vaginal bleeding and vaginal discharge.    Blood pressure 109/70, pulse 98, height 5' 7 (1.702 m), weight 159 lb (72.1 kg), last menstrual period 11/09/2023.  Physical Exam Physical Exam Vitals and nursing note reviewed. Exam conducted with a chaperone present.  Constitutional:      Appearance: Normal appearance.  Cardiovascular:     Rate and Rhythm: Normal rate.  Pulmonary:     Effort: Pulmonary effort is normal.  Genitourinary:    General: Normal vulva.     Exam position: Lithotomy position.     Vagina: Normal.     Cervix: Normal.  Neurological:     Mental Status: She is alert.  Psychiatric:        Mood and Affect: Mood normal.        Behavior: Behavior normal.     Data Reviewed Pap and Bx results  Assessment    Procedure Details  The risks and benefits of the procedure and Written informed consent obtained.    Teflon coated speculum with smoke evacuator placed.  Cervix visualized. Paracervical block placed.  Medium Fischer loop used to remove cone of cervix using blend of cut and cautery on LEEP machine.  Edges/Base cauterized with ball.  Monsel's solution used for hemostasis.  Patient tolerated procedure well.  Patient given post procedure instructions.  Follow up in 12 months for repeat pap or as needed. Specimens: LEEP cone  specimen  Complications: none.     Plan    Specimens labelled and sent to Pathology.    Lynwood Solomons 11/19/2023, 9:28 AM

## 2023-11-19 NOTE — Progress Notes (Signed)
 38 y.o. GYN presents for LEEP for CIN 2-3 on COLPO/Abnormal PAP.  UPT  Negative

## 2023-11-20 ENCOUNTER — Ambulatory Visit: Payer: Self-pay | Admitting: Obstetrics & Gynecology

## 2023-11-20 LAB — SURGICAL PATHOLOGY

## 2023-11-20 NOTE — Progress Notes (Signed)
 CIN 2-3, at endocervical margin. Repeat pap in 6 months
